# Patient Record
Sex: Female | Born: 1965
Health system: Southern US, Community
[De-identification: ages and names within clinical notes are randomized; demographics above are authoritative.]

## PROBLEM LIST (undated history)

## (undated) DIAGNOSIS — D649 Anemia, unspecified: Secondary | ICD-10-CM

## (undated) DIAGNOSIS — R112 Nausea with vomiting, unspecified: Secondary | ICD-10-CM

## (undated) DIAGNOSIS — Z8619 Personal history of other infectious and parasitic diseases: Secondary | ICD-10-CM

## (undated) DIAGNOSIS — I639 Cerebral infarction, unspecified: Secondary | ICD-10-CM

## (undated) DIAGNOSIS — Z9889 Other specified postprocedural states: Secondary | ICD-10-CM

## (undated) DIAGNOSIS — I669 Occlusion and stenosis of unspecified cerebral artery: Secondary | ICD-10-CM

## (undated) DIAGNOSIS — B977 Papillomavirus as the cause of diseases classified elsewhere: Secondary | ICD-10-CM

## (undated) DIAGNOSIS — U071 COVID-19: Secondary | ICD-10-CM

## (undated) DIAGNOSIS — U099 Post covid-19 condition, unspecified: Secondary | ICD-10-CM

## (undated) DIAGNOSIS — R519 Headache, unspecified: Secondary | ICD-10-CM

## (undated) HISTORY — DX: Personal history of other infectious and parasitic diseases: Z86.19

## (undated) HISTORY — DX: Papillomavirus as the cause of diseases classified elsewhere: B97.7

## (undated) HISTORY — DX: COVID-19: U07.1

## (undated) HISTORY — DX: Post covid-19 condition, unspecified: U09.9

## (undated) HISTORY — DX: Nausea with vomiting, unspecified: R11.2

## (undated) HISTORY — DX: Occlusion and stenosis of unspecified cerebral artery: I66.9

## (undated) HISTORY — DX: Headache, unspecified: R51.9

## (undated) HISTORY — DX: Anemia, unspecified: D64.9

## (undated) HISTORY — DX: Other specified postprocedural states: Z98.890

---

## 1999-10-12 ENCOUNTER — Other Ambulatory Visit: Admission: RE | Admit: 1999-10-12 | Discharge: 1999-10-12 | Payer: Self-pay | Admitting: Obstetrics and Gynecology

## 2000-06-14 ENCOUNTER — Emergency Department (HOSPITAL_COMMUNITY): Admission: EM | Admit: 2000-06-14 | Discharge: 2000-06-14 | Payer: Self-pay | Admitting: Emergency Medicine

## 2000-06-14 ENCOUNTER — Encounter: Payer: Self-pay | Admitting: Emergency Medicine

## 2000-07-19 ENCOUNTER — Other Ambulatory Visit: Admission: RE | Admit: 2000-07-19 | Discharge: 2000-07-19 | Payer: Self-pay | Admitting: Obstetrics and Gynecology

## 2001-02-12 ENCOUNTER — Encounter: Payer: Self-pay | Admitting: Obstetrics and Gynecology

## 2001-02-12 ENCOUNTER — Ambulatory Visit (HOSPITAL_COMMUNITY): Admission: RE | Admit: 2001-02-12 | Discharge: 2001-02-12 | Payer: Self-pay | Admitting: Obstetrics and Gynecology

## 2001-02-27 ENCOUNTER — Encounter: Payer: Self-pay | Admitting: Obstetrics and Gynecology

## 2001-02-27 ENCOUNTER — Ambulatory Visit (HOSPITAL_COMMUNITY): Admission: AD | Admit: 2001-02-27 | Discharge: 2001-02-27 | Payer: Self-pay | Admitting: Obstetrics and Gynecology

## 2001-05-31 ENCOUNTER — Inpatient Hospital Stay (HOSPITAL_COMMUNITY): Admission: AD | Admit: 2001-05-31 | Discharge: 2001-05-31 | Payer: Self-pay | Admitting: Obstetrics and Gynecology

## 2001-07-09 ENCOUNTER — Inpatient Hospital Stay (HOSPITAL_COMMUNITY): Admission: AD | Admit: 2001-07-09 | Discharge: 2001-07-09 | Payer: Self-pay | Admitting: Obstetrics and Gynecology

## 2001-08-08 ENCOUNTER — Inpatient Hospital Stay (HOSPITAL_COMMUNITY): Admission: AD | Admit: 2001-08-08 | Discharge: 2001-08-12 | Payer: Self-pay | Admitting: Obstetrics and Gynecology

## 2001-08-13 ENCOUNTER — Encounter: Admission: RE | Admit: 2001-08-13 | Discharge: 2001-09-12 | Payer: Self-pay | Admitting: Obstetrics and Gynecology

## 2001-09-11 ENCOUNTER — Other Ambulatory Visit: Admission: RE | Admit: 2001-09-11 | Discharge: 2001-09-11 | Payer: Self-pay | Admitting: Obstetrics & Gynecology

## 2001-09-25 ENCOUNTER — Encounter: Admission: RE | Admit: 2001-09-25 | Discharge: 2001-10-25 | Payer: Self-pay | Admitting: Obstetrics and Gynecology

## 2002-09-09 ENCOUNTER — Other Ambulatory Visit: Admission: RE | Admit: 2002-09-09 | Discharge: 2002-09-09 | Payer: Self-pay | Admitting: Obstetrics and Gynecology

## 2003-10-01 ENCOUNTER — Encounter: Admission: RE | Admit: 2003-10-01 | Discharge: 2003-10-01 | Payer: Self-pay | Admitting: Obstetrics and Gynecology

## 2003-10-31 HISTORY — PX: BLEPHAROPLASTY: SUR158

## 2004-02-22 ENCOUNTER — Other Ambulatory Visit: Admission: RE | Admit: 2004-02-22 | Discharge: 2004-02-22 | Payer: Self-pay | Admitting: Obstetrics and Gynecology

## 2004-09-05 ENCOUNTER — Encounter: Admission: RE | Admit: 2004-09-05 | Discharge: 2004-09-05 | Payer: Self-pay | Admitting: Obstetrics and Gynecology

## 2005-02-27 ENCOUNTER — Other Ambulatory Visit: Admission: RE | Admit: 2005-02-27 | Discharge: 2005-02-27 | Payer: Self-pay | Admitting: Obstetrics and Gynecology

## 2007-04-05 IMAGING — US MAMMO-BILAT-US
1 series · 12 of 16 positions shown · non-contrast
Comparison: NONE

CLINICAL DATA: KIYAH, RT(R)(M)   
Diagnostic  Mammogram. 

BILATERAL MAMMOGRAM ADDITIONAL VIEWS AND BILATERAL BREAST 
ULTRASOUND

[Series 1: us breast bil · 0.07mm/px · 12 of 50 slices shown]
[im 1/50]
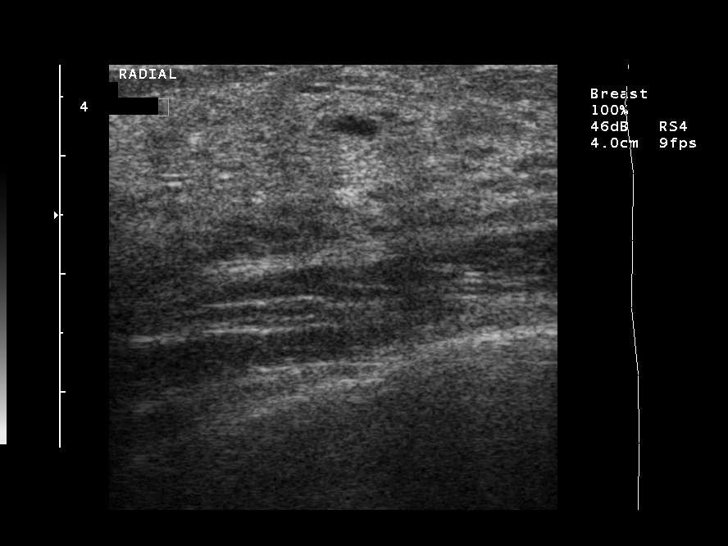
[im 7/50]
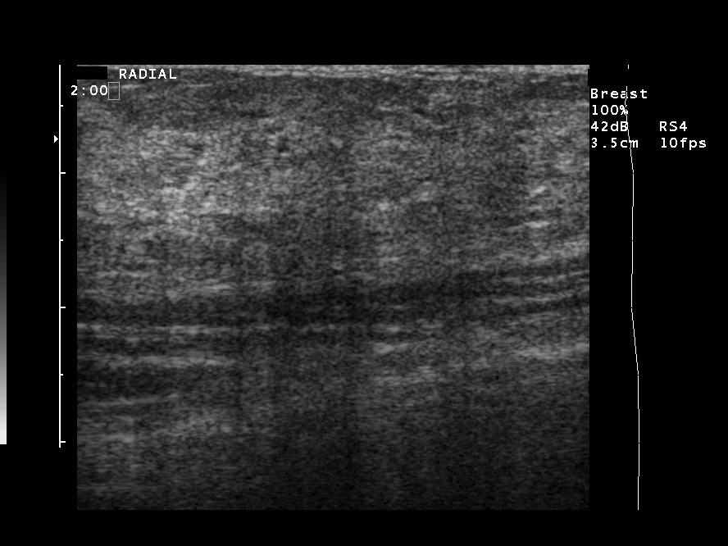
[im 10/50]
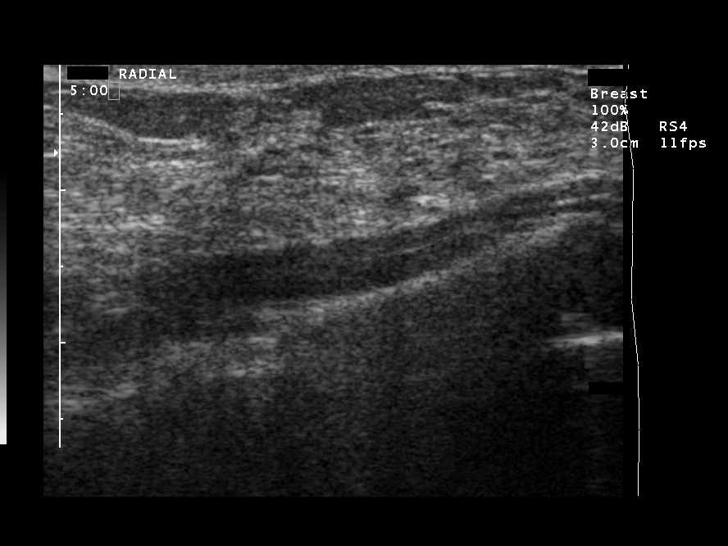
[im 14/50]
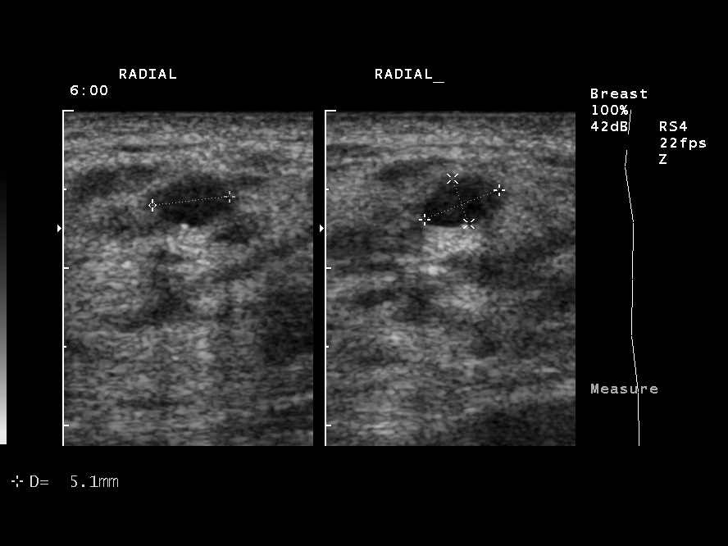
[im 20/50]
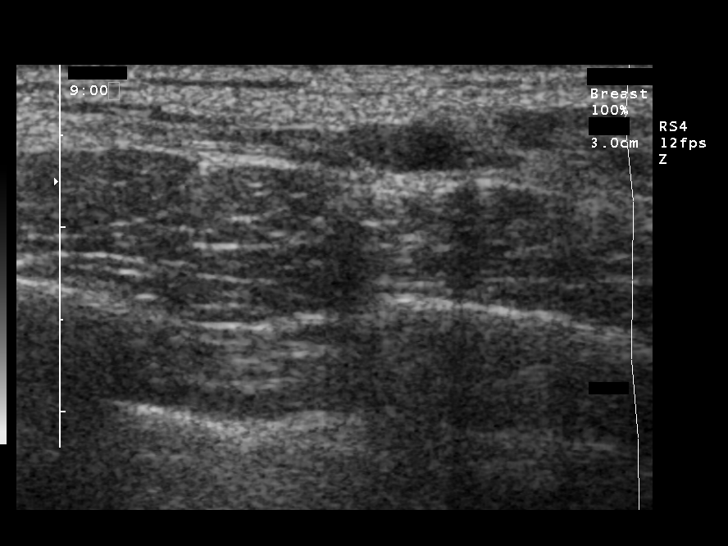
[im 23/50]
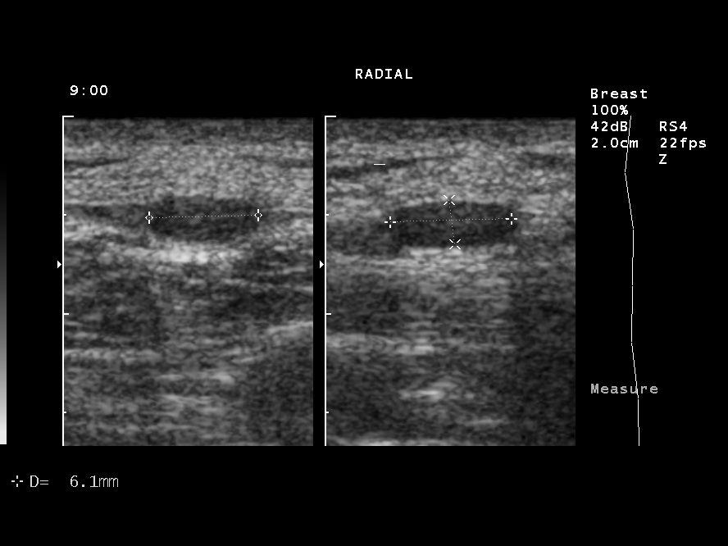
[im 27/50]
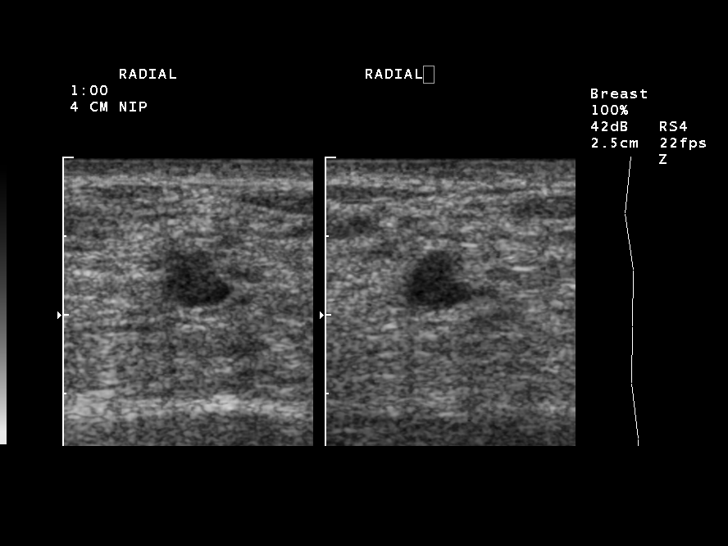
[im 33/50]
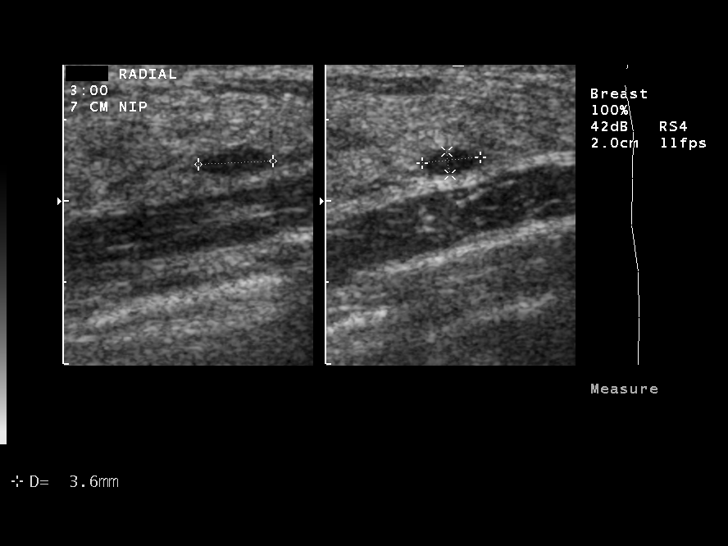
[im 36/50]
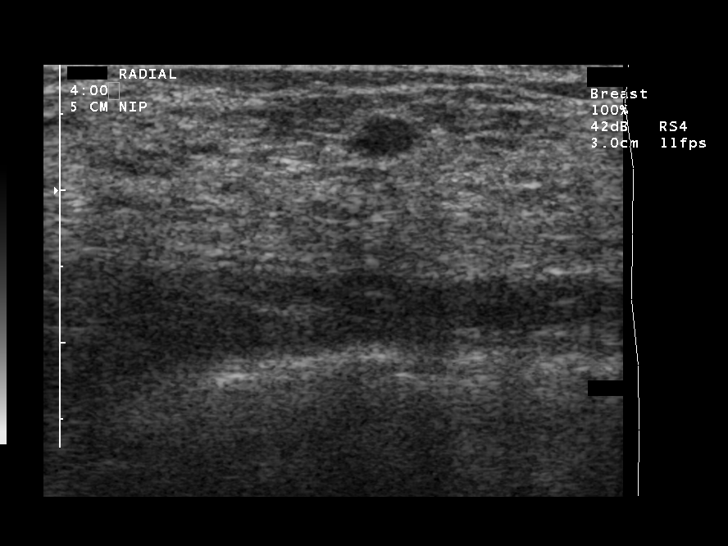
[im 40/50]
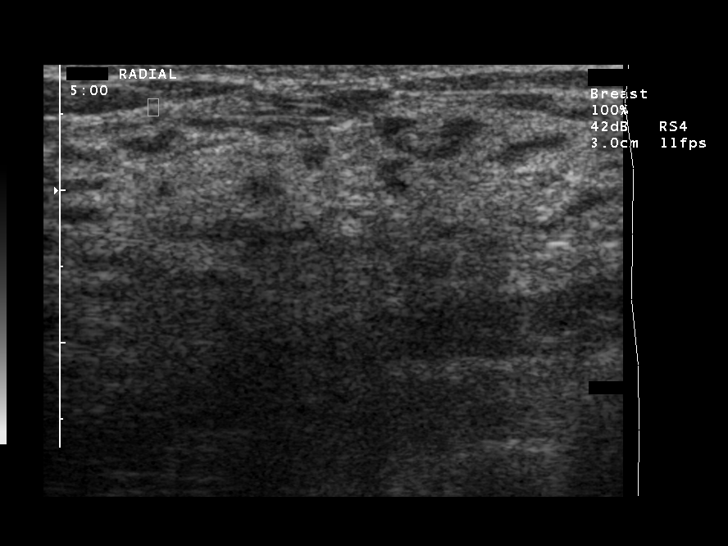
[im 46/50]
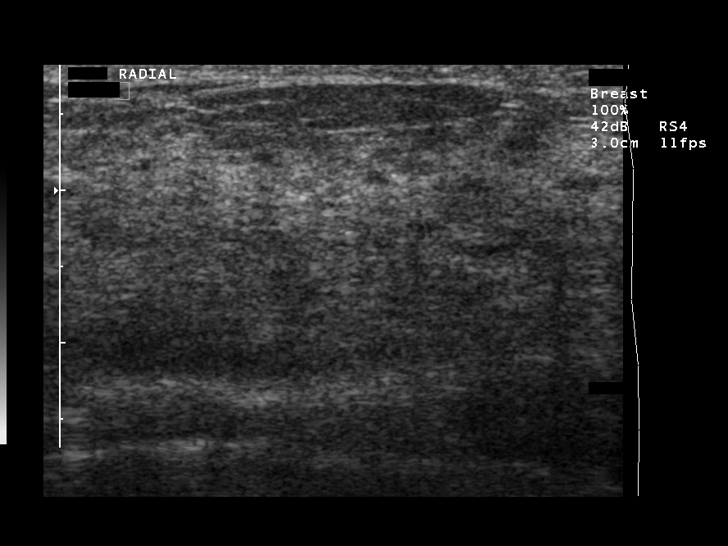
[im 50/50]
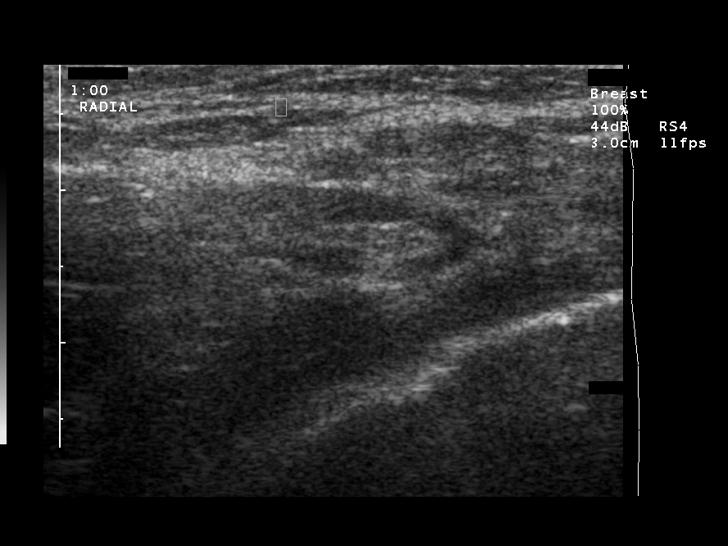

[12 of 16 positions shown; findings below may reference images not displayed]

FINDINGS: There is moderately dense nodular parenchyma 
bilaterally.  A repeat MLO view of the left breast was obtained.  
There are no dominant masses seen.  No evidence of malignant 
appearing microcalcifications.  No evidence of architectural 
distortion. The right breast demonstrates a benign calcification.  
True lateral view was obtained in the right breast.  There is a 
persistent area of increased density in the upper half of the 
breast.  This was also seen on earlier mammograms dated [DATE] 
from the [REDACTED] for comparison.  An exaggerated lateral CC 
view of the right breast was also obtained.  There again is an 
area of density in the outer aspect of the breast.  This measures 
approximately 4 cm in diameter.  This area also seems to be 
present in the earlier mammogram dated [DATE].  There are no 
malignant appearing microcalcifications seen in either breast. 
Ultrasound was obtained for both breasts.  Complex appearing cyst 
seen in the right breast at the [DATE] position, 4 cm from the 
nipple.  There also appears to be a complex cyst at the [DATE] 
position.  There is a solid oval shaped mass with a well 
circumscribed smooth margin seen in the [DATE] position of the right 
breast in the axilla region.  There is blood flow demonstrated 
within this lesion.  This measures 6 x 2 x 6 mm.  There is a 
complex appearing cyst seen in the [DATE] position of the left 
breast, 4 cm from the nipple.  There is a small oval shaped 
hypoechoic lesion with well-circumscribed margin parallel to the 
skin surface at the [DATE] position, 7 cm from the nipple.  This 
measures 4 x 1 x 5 mm.  There is a complex appearing cyst seen in 
the [DATE] position, 7 cm from the nipple.  Hypoechoic area at the 
[DATE] position, 5 cm from the nipple.  This measures 8 x 3 x 5 mm.  
No abnormal acoustic attenuation.
IMPRESSION: There are multiple hypoechoic lesions and complex 
cysts seen in both breasts.  I would recommend a six-month 
interval follow-up bilateral breast ultrasound. Area of more 
confluent rounded density seen in the outer aspect of the right 
breast.  This appears to be in the upper half of the breast on the 
MLO view.  This appears to be present on earlier mammograms as 
well.  There is no corresponding mass seen on ultrasound for this 
finding.  This is likely breast parenchyma.  I would recommend a 
six-month interval follow-up right breast mammogram. No 
mammographic evidence of malignancy is seen involving the left 
breast. I recommend routine yearly follow-up left breast screening 
mammography. The patient was informed at the time of the 
examination of the findings and recommendations by verbal and 
written lay report. Computer assisted (Second Look) technology was 
used as an aid in interpretation of this study. BI-RADS 3 - 
[DATE] Dict Date: [DATE]  Trans Date: [DATE] JH  KIYAH

## 2007-10-16 IMAGING — US MAMMO-RUNI-US
1 series · 14 of 16 positions shown · non-contrast
Comparison: NONE

CLINICAL DATA: HUMEYD Diagnostic Mammogram. 

RIGHT BREAST MAMMOGRAM AND BILATERAL BREAST ULTRASOUND

[Series 1: us b/left breast · 0.06mm/px · 14 of 46 slices shown]
[im 1/46]
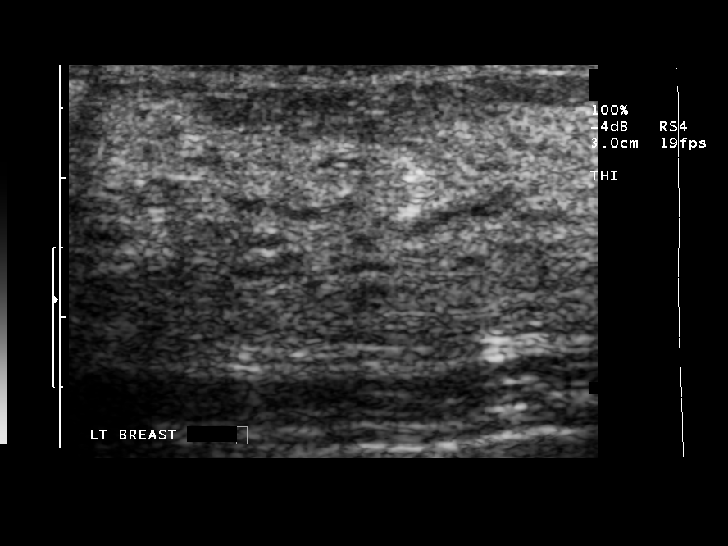
[im 4/46]
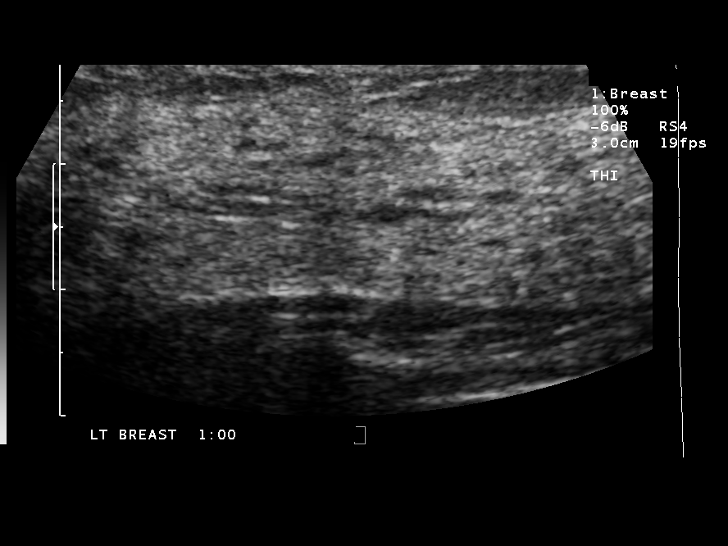
[im 7/46]
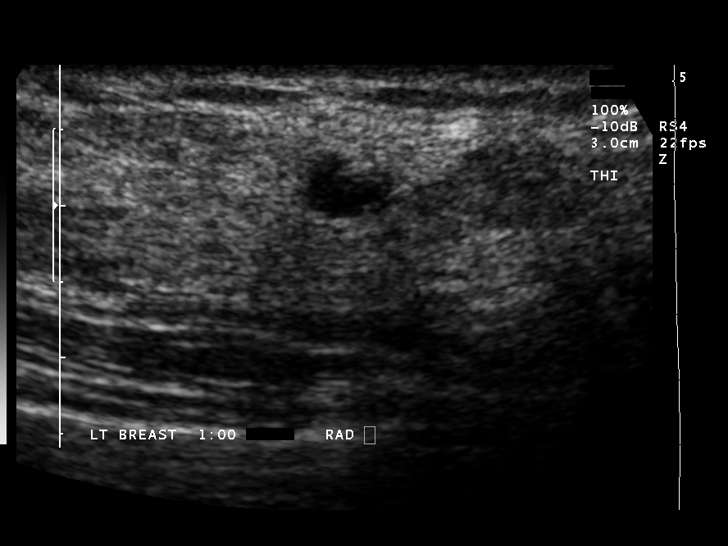
[im 13/46]
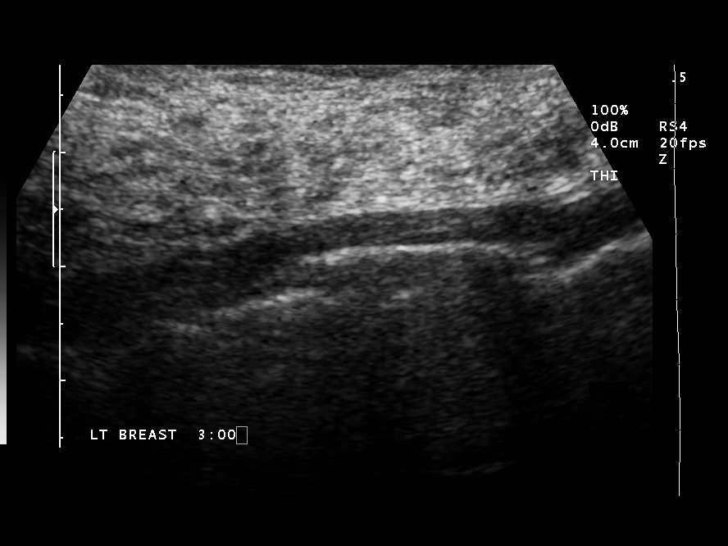
[im 16/46]
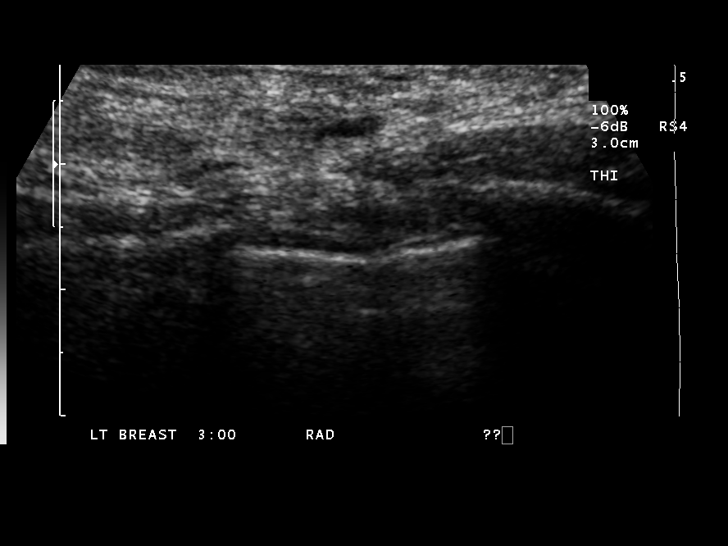
[im 19/46]
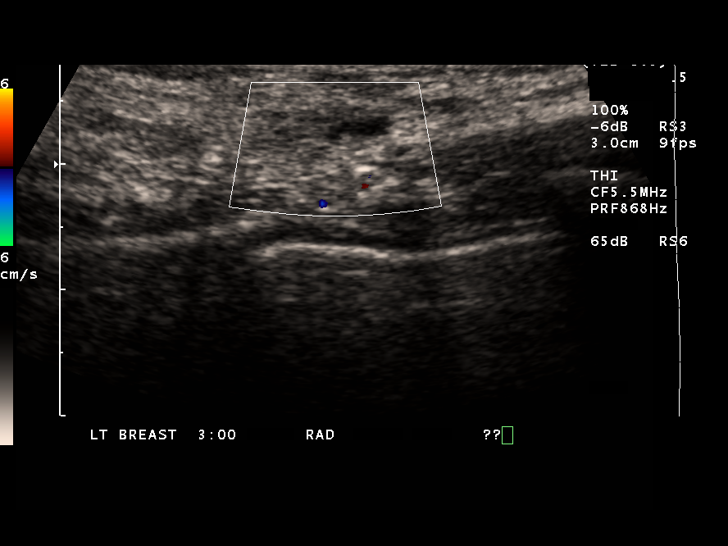
[im 22/46]
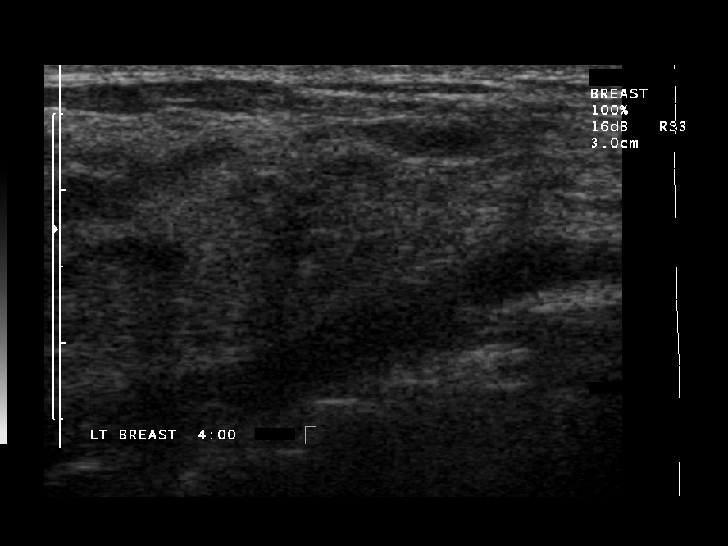
[im 25/46]
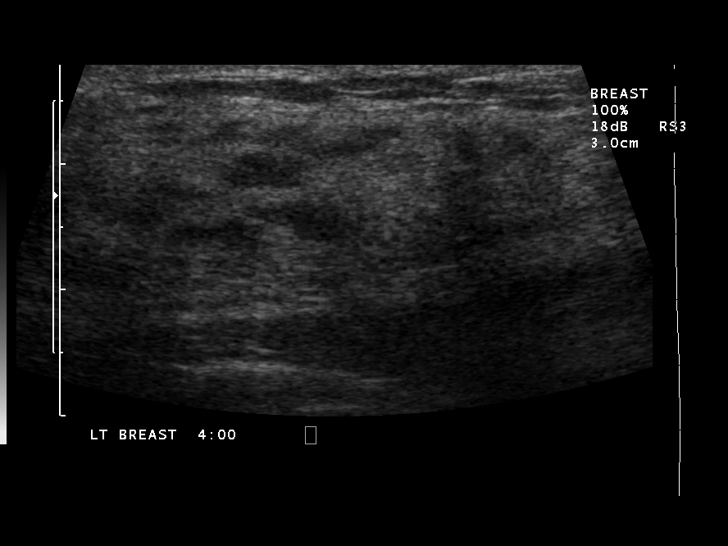
[im 28/46]
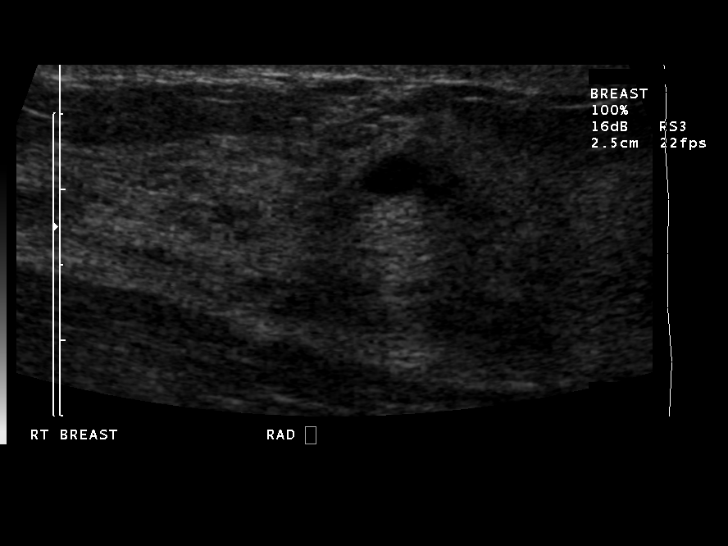
[im 31/46]
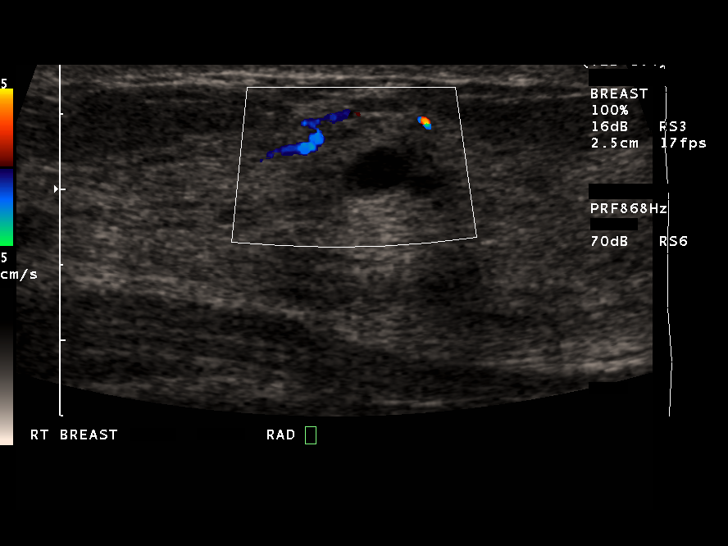
[im 37/46]
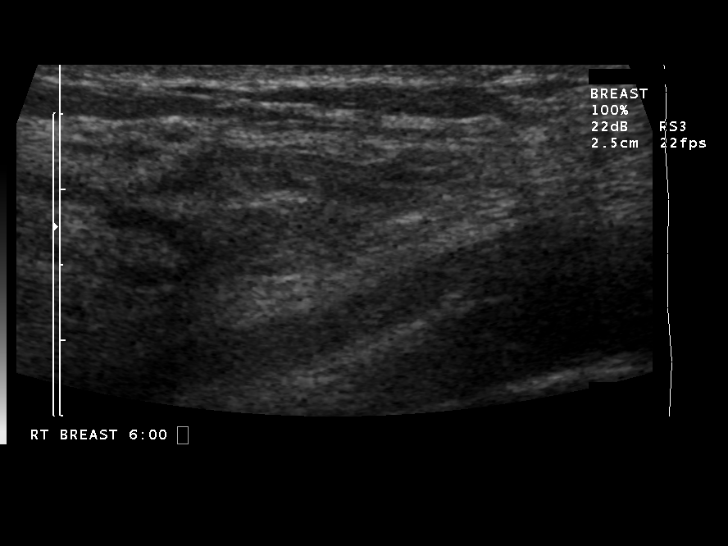
[im 40/46]
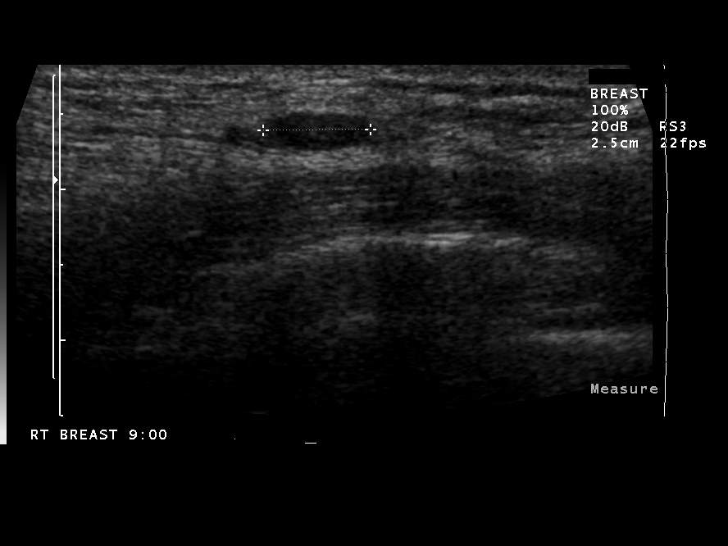
[im 43/46]
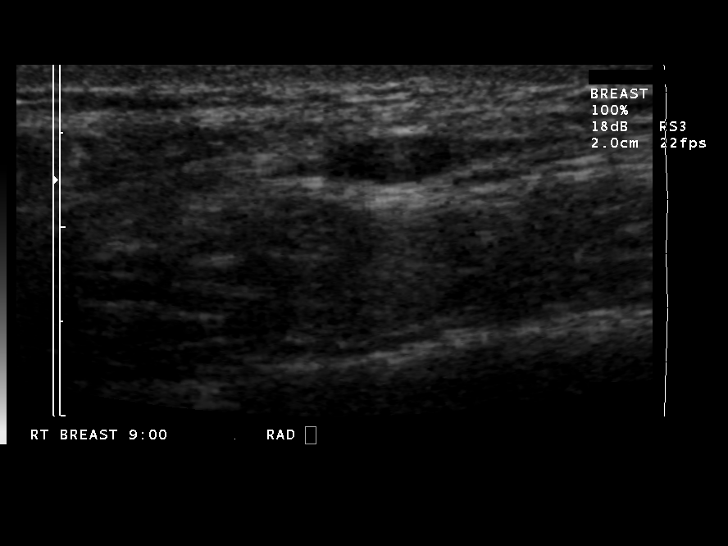
[im 46/46]
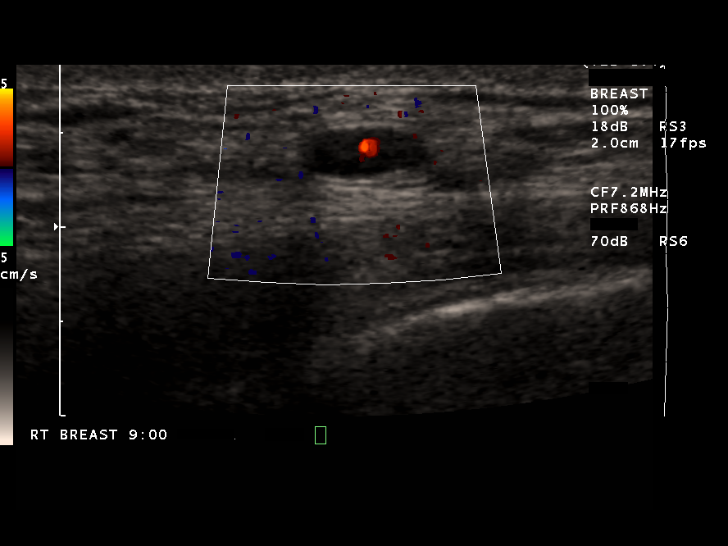

[14 of 16 positions shown; findings below may reference images not displayed]

FINDINGS: Mammogram was compared to [DATE].  Dense retained 
fibroglandular tissue.  Scattered benign appearing calcifications. 
 Multiple masses varying in size, none of which appear more 
suspicious at this time to warrant biopsy. Bilateral breast 
ultrasound demonstrates extensive cystic and fibrocystic changes 
bilaterally without evidence of suspicious cystic, solid, or 
shadowing mass.
IMPRESSION: No finding suspicious for malignancy.  Bilateral 
mammogram in six months is recommended to continue routine 
screening. Computer assisted (Second Look) technology was used as 
an aid in interpretation of this study. The patient was informed 
at the time of the examination of the findings and recommendations 
by verbal and written lay report. BI-RADS 2- Benign HUMEYD

## 2013-03-04 ENCOUNTER — Other Ambulatory Visit: Payer: Self-pay | Admitting: Obstetrics and Gynecology

## 2013-03-04 DIAGNOSIS — M79621 Pain in right upper arm: Secondary | ICD-10-CM

## 2013-03-04 DIAGNOSIS — R2231 Localized swelling, mass and lump, right upper limb: Secondary | ICD-10-CM

## 2013-03-12 ENCOUNTER — Other Ambulatory Visit: Payer: Self-pay

## 2013-03-12 ENCOUNTER — Ambulatory Visit
Admission: RE | Admit: 2013-03-12 | Discharge: 2013-03-12 | Disposition: A | Discharge status: Home / Self Care | Payer: No Typology Code available for payment source | Source: Ambulatory Visit | Attending: Obstetrics and Gynecology | Admitting: Obstetrics and Gynecology

## 2013-03-12 DIAGNOSIS — R2231 Localized swelling, mass and lump, right upper limb: Secondary | ICD-10-CM

## 2013-03-12 DIAGNOSIS — M79621 Pain in right upper arm: Secondary | ICD-10-CM

## 2013-03-12 IMAGING — MG MM DIGITAL DIAGNOSTIC UNILAT*R*
3 series · 3 of 3 positions shown · non-contrast
Comparison: Prior exams

CLINICAL DATA: Ultrasound questioned palpable finding of focal
pain right breast 10 o'clock location for 3-4 weeks.  No change
during that time.

DIGITAL DIAGNOSTIC RIGHT MAMMOGRAM WITH CAD AND RIGHT BREAST
ULTRASOUND
Mammographic images were processed with CAD.

[R CC]
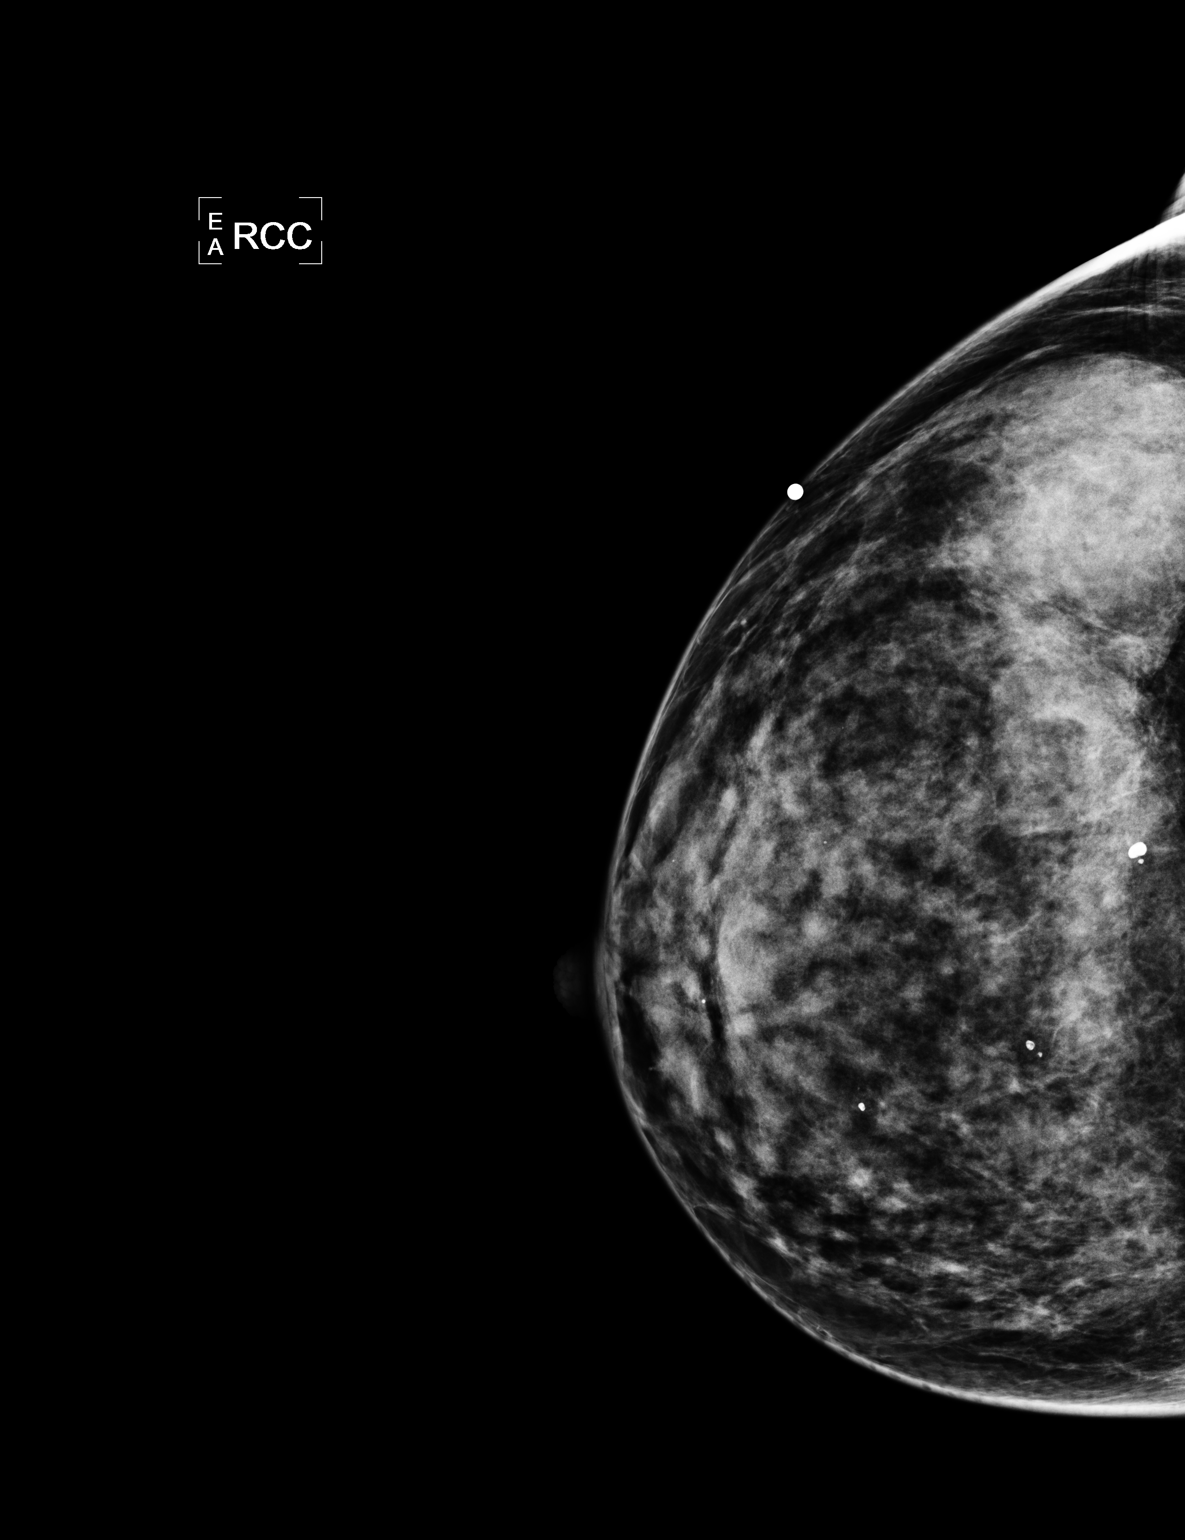

[R MLO]
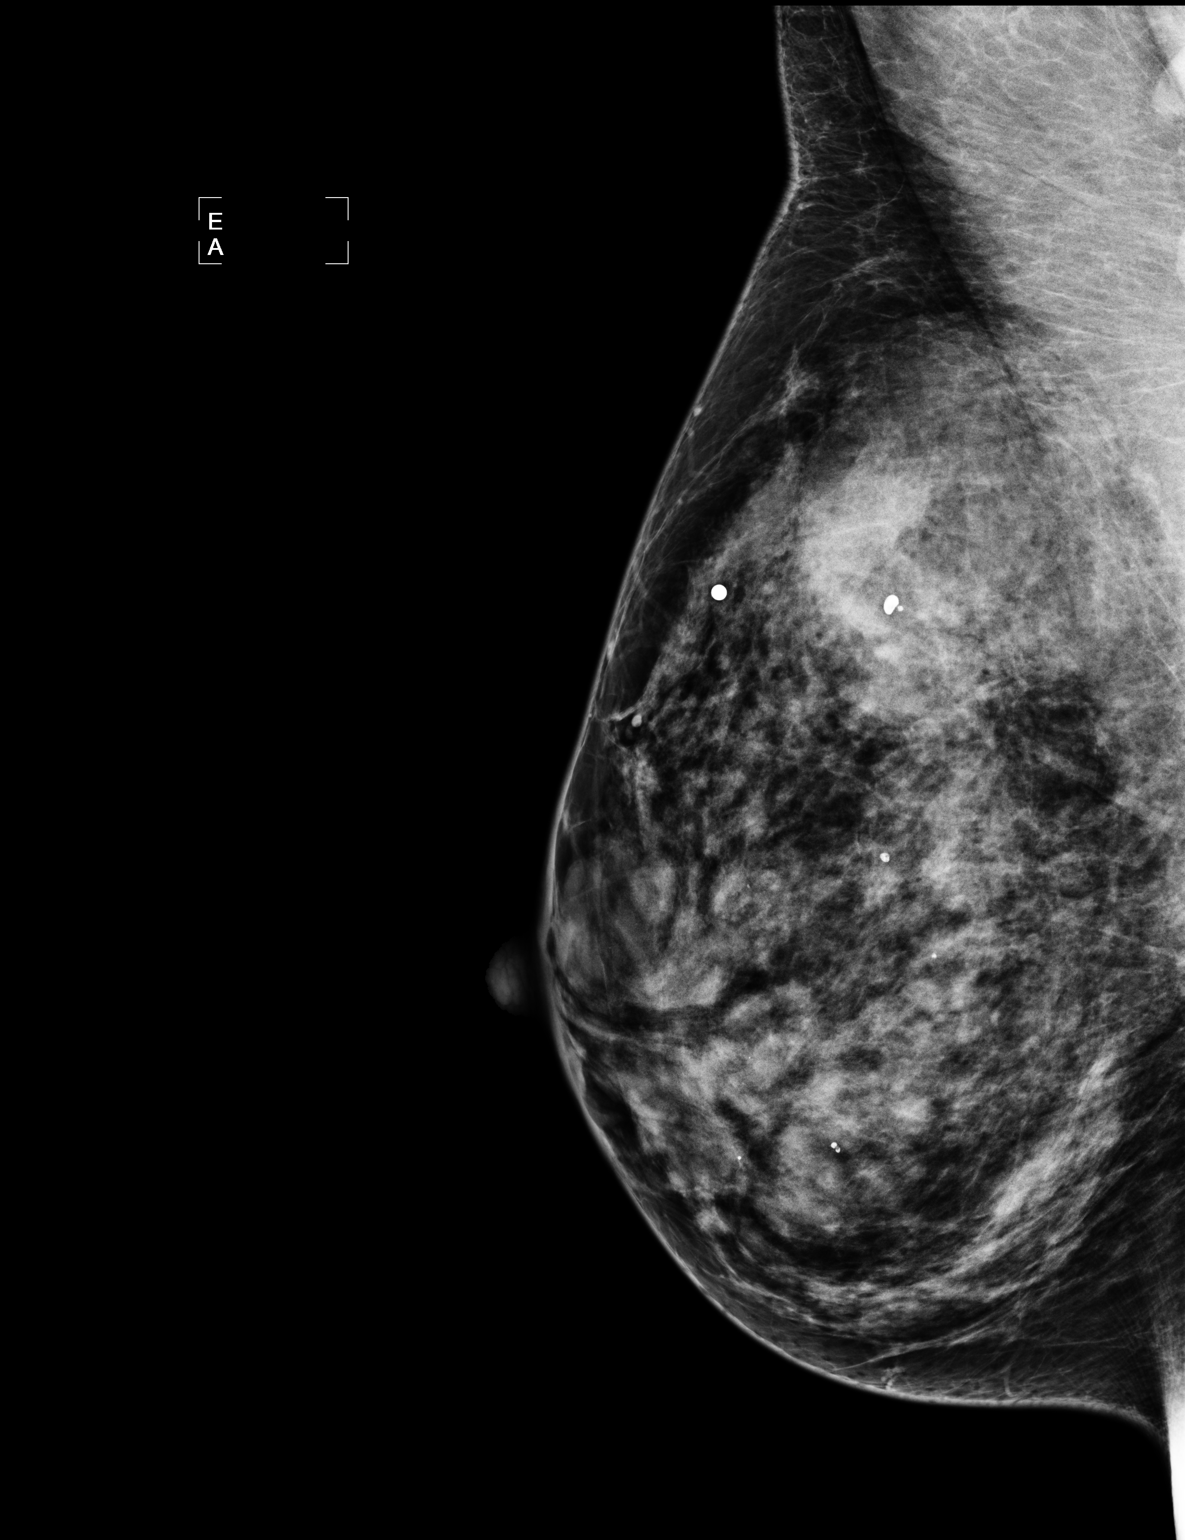

[R TAN]
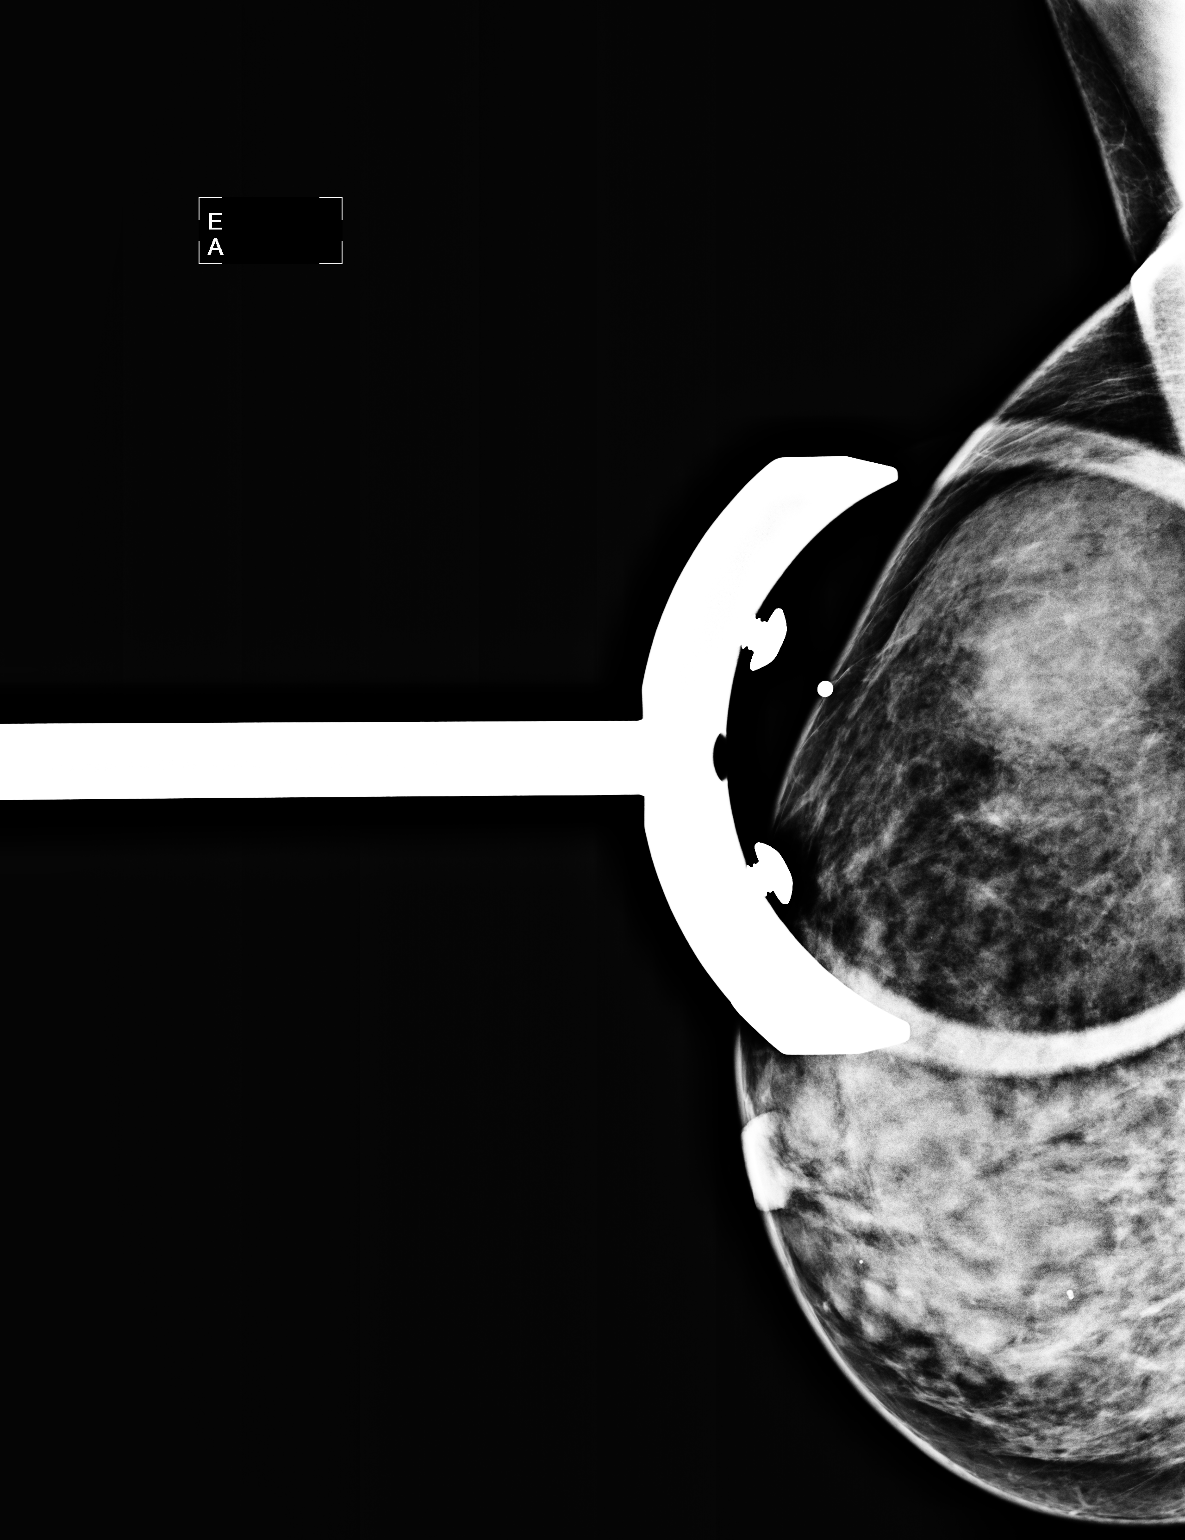

[3 of 3 positions shown; findings below may reference images not displayed]

FINDINGS: ACR Breast Density Category 4: The breast tissue is extremely
dense.

No suspicious mass, calcification, or architectural distortion is
identified in the right breast.

At physical exam, I palpate no abnormality in the right upper outer
quadrant.

Ultrasound is performed, demonstrating normal-appearing dense
fibroglandular tissue in the right upper outer quadrant but no
focal abnormality.
IMPRESSION: No evidence for malignancy in the right breast.  Any further
management of the questioned palpable finding should be dictated by
clinical assessment.  Otherwise, bilateral screening mammography is
recommended [DATE]

RECOMMENDATION:
Screening mammography [DATE]

I have discussed the findings and recommendations with the patient.
Results were also provided in writing at the conclusion of the
visit.  If applicable, a reminder letter will be sent to the
patient regarding her next appointment.

BI-RADS CATEGORY 1:  Negative.

## 2013-03-14 ENCOUNTER — Other Ambulatory Visit: Payer: Self-pay

## 2017-01-18 ENCOUNTER — Other Ambulatory Visit: Payer: Self-pay | Admitting: Obstetrics and Gynecology

## 2017-01-18 DIAGNOSIS — R928 Other abnormal and inconclusive findings on diagnostic imaging of breast: Secondary | ICD-10-CM

## 2017-01-26 ENCOUNTER — Ambulatory Visit
Admission: RE | Admit: 2017-01-26 | Discharge: 2017-01-26 | Disposition: A | Discharge status: Home / Self Care | Payer: 59 | Source: Ambulatory Visit | Attending: Obstetrics and Gynecology | Admitting: Obstetrics and Gynecology

## 2017-01-26 ENCOUNTER — Encounter: Payer: Self-pay | Admitting: Radiology

## 2017-01-26 DIAGNOSIS — R928 Other abnormal and inconclusive findings on diagnostic imaging of breast: Secondary | ICD-10-CM

## 2018-12-03 ENCOUNTER — Ambulatory Visit: Payer: 59 | Admitting: Family Medicine

## 2018-12-03 ENCOUNTER — Encounter: Payer: Self-pay | Admitting: Family Medicine

## 2018-12-03 VITALS — BP 128/81 | HR 69 | Temp 98.6°F | Resp 16 | Ht 62.5 in | Wt 132.2 lb

## 2018-12-03 DIAGNOSIS — R6889 Other general symptoms and signs: Secondary | ICD-10-CM | POA: Diagnosis not present

## 2018-12-03 DIAGNOSIS — R05 Cough: Secondary | ICD-10-CM | POA: Diagnosis not present

## 2018-12-03 DIAGNOSIS — R059 Cough, unspecified: Secondary | ICD-10-CM

## 2018-12-03 DIAGNOSIS — Z7689 Persons encountering health services in other specified circumstances: Secondary | ICD-10-CM

## 2018-12-03 DIAGNOSIS — J0100 Acute maxillary sinusitis, unspecified: Secondary | ICD-10-CM | POA: Insufficient documentation

## 2018-12-03 LAB — POC INFLUENZA A&B (BINAX/QUICKVUE)
Influenza A, POC: NEGATIVE
Influenza B, POC: NEGATIVE

## 2018-12-03 MED ORDER — DOXYCYCLINE HYCLATE 100 MG PO TABS: 100.0000 mg | ORAL_TABLET | Freq: Two times a day (BID) | ORAL | 0 refills | Status: DC

## 2018-12-03 NOTE — Progress Notes (Signed)
Patient ID: Sydney [name], female  DOB: 1966-02-01, 53 y.o.   MRN: [id number] Patient Care Team    Relationship Specialty Notifications Start End  [name], DO PCP - General Family Medicine  12/03/18   [name], MD Consulting Physician Obstetrics and Gynecology  12/03/18     Chief Complaint  Patient presents with  . Establish Care    Fasting. PCP has been GYN Dr Freda Munro. Pt works for [employer] and uses them and urgent cares for sick visits   . Cough    since 1/30   . Fever    Fever since Friday, broke yesterday     Subjective:  Sydney [name] is a 53 y.o.  female present for new patient establishment. All past medical history, surgical history, allergies, family history, immunizations, medications and social history were updated in the electronic medical record today. All recent labs, ED visits and hospitalizations within the last year were reviewed.  Flu like symptoms/cough/fever: pt reports she has had a dry cough for a few months. However 5 days ago she started to have a fever 101.5 F that worsened over the weekend with tmax 102.7 F. She took Excedrin to help with fever and aches and mucinex. She endorses nausea and vomit X1 with Excedrin use- dark "coffee ground" appearance that has resolved. No bowel changes.  She hs tolerated PO, although her appetite is decreased. She is extremely fatigued, had myalgias, tight chest feeling, cough, and severe sinus pressure.  Pt did not have her flu shot this year. One of her coworkers had the flu last week.   Depression screen [facility] 2/9 12/03/2018  Decreased Interest 0  Down, Depressed, Hopeless 0  PHQ - 2 Score 0   No flowsheet data found.      No flowsheet data found. Immunization History  Administered Date(s) Administered  . Influenza-Unspecified 08/19/2014    No exam data present  Past Medical History:  Diagnosis Date  . History of cold sores   . HPV (human papilloma virus) infection    No Known  Allergies Past Surgical History:  Procedure Laterality Date  . BLEPHAROPLASTY  2005  . CESAREAN SECTION  2002   Family History  Problem Relation Age of Onset  . Lung cancer Maternal Grandmother   . 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 Always use your most recent med list.        calcium-vitamin D 500-200 MG-UNIT tablet Commonly known as:  OSCAL WITH D Take 1 tablet by mouth.   doxycycline 100 MG tablet Commonly known as:  VIBRA-TABS Take 1 tablet (100 mg total) by mouth 2 (two) times daily.   valACYclovir 500 MG tablet Commonly known as:  VALTREX valacyclovir 500 mg tablet  TAKE 1 TABLET(S) TWICE A DAY BY ORAL ROUTE AS NEEDED.       All past medical history, surgical history,  allergies, family history, immunizations andmedications were updated in the EMR today and reviewed under the history and medication portions of their EMR.    Recent Results (from the past 2160 hour(s))  POC Influenza A&B(BINAX/QUICKVUE)     Status: Normal   Collection Time: 12/03/18 12:56 PM  Result Value Ref Range   Influenza A, POC Negative Negative   Influenza B, POC Negative Negative     Mm Diag Breast Tomo Uni Left Result Date: 01/26/2017  BI-RADS CATEGORY  1: Negative. Electronically Signed   By: Fidela Salisbury M.D.   On: 01/26/2017 16:09   ROS: 14 pt review of systems performed and negative (unless mentioned in an HPI)  Objective: BP 128/81 (BP Location: Left Arm, Patient Position: Sitting, Cuff Size: Normal)   Pulse 69   Temp 98.6 F  (37 C) (Oral)   Resp 16   Ht 5' 2.5" (1.588 m)   Wt 132 lb 4 oz (60 kg)   LMP 07/30/2018 Comment: Pt is perimenopausal   SpO2 97%   BMI 23.80 kg/m  Gen: Afebrile. No acute distress. Nontoxic in appearance, well-developed, well-nourished, pleasant caucasian female.  HENT: AT. [place]. Bilateral TM visualized and normal in appearance, normal external auditory canal. MMM, no oral lesions, adequate dentition. Bilateral nares with severe erythema, drainage and swelling. Throat without erythema, ulcerations or exudates. moderate Cough on exam and hoarseness on exam.  Eyes:Pupils Equal Round Reactive to light, Extraocular movements intact,  Conjunctiva without redness, discharge or icterus. Neck/lymp/endocrine: Supple,bilateral tender cervical anterior lymphadenopathy CV: RRR  Chest: CTAB, no wheeze, rhonchi or crackles. normal Respiratory effort. normal Air movement. Skin: no rashes, purpura or petechiae. Warm and well-perfused. Skin intact. Neuro/Msk: Normal gait. PERLA. EOMi. Alert. Oriented x3.    Assessment/plan: Sydney [name] is a 53 y.o. female present for EST- acute.  Flu-like symptoms - POC Influenza A&B(BINAX/QUICKVUE)--> negative, it has been 5 days after symptoms possibly low viral  Shedding at this point.  CoughAcute non-recurrent maxillary sinusitis Likely viral infection with 2/2 bacteria infection given worsening symptoms and high fever.   Rest, hydrate.  + flonase, mucinex DM,  nasal saline.  Doxycyline  prescribed, take until completed.  If cough present it can last up to 6-8 weeks.  Work excuse provided 2/3-5 F/U 2 weeks of not improved.   Return in about 2 weeks (around 12/17/2018), or if symptoms worsen or fail to improve.  Greater than 30 minutes spent with patient, >50% of time spent face to face counseling   Yearly CPE encouraged   Note is dictated utilizing voice recognition software. Although note has been proof read prior to signing, occasional typographical  errors still can be missed. If any questions arise, please do not hesitate to call for verification.  Electronically signed by: Howard Pouch, DO [place]

## 2018-12-03 NOTE — Patient Instructions (Addendum)
Rest, hydrate.  + flonase, mucinex DM,  nasal saline.  Doxycyline  prescribed, take until completed.  If cough present it can last up to 6-8 weeks.  F/U 2 weeks of not improved.    Sinusitis, Adult Sinusitis is soreness and swelling (inflammation) of your sinuses. Sinuses are hollow spaces in the bones around your face. They are located:  Around your eyes.  In the middle of your forehead.  Behind your nose.  In your cheekbones. Your sinuses and nasal passages are lined with a fluid called mucus. Mucus drains out of your sinuses. Swelling can trap mucus in your sinuses. This lets germs (bacteria, virus, or fungus) grow, which leads to infection. Most of the time, this condition is caused by a virus. What are the causes? This condition is caused by:  Allergies.  Asthma.  Germs.  Things that block your nose or sinuses.  Growths in the nose (nasal polyps).  Chemicals or irritants in the air.  Fungus (rare). What increases the risk? You are more likely to develop this condition if:  You have a weak body defense system (immune system).  You do a lot of swimming or diving.  You use nasal sprays too much.  You smoke. What are the signs or symptoms? The main symptoms of this condition are pain and a feeling of pressure around the sinuses. Other symptoms include:  Stuffy nose (congestion).  Runny nose (drainage).  Swelling and warmth in the sinuses.  Headache.  Toothache.  A cough that may get worse at night.  Mucus that collects in the throat or the back of the nose (postnasal drip).  Being unable to smell and taste.  Being very tired (fatigue).  A fever.  Sore throat.  Bad breath. How is this diagnosed? This condition is diagnosed based on:  Your symptoms.  Your medical history.  A physical exam.  Tests to find out if your condition is short-term (acute) or long-term (chronic). Your doctor may: ? Check your nose for growths (polyps). ? Check  your sinuses using a tool that has a light (endoscope). ? Check for allergies or germs. ? Do imaging tests, such as an MRI or CT scan. How is this treated? Treatment for this condition depends on the cause and whether it is short-term or long-term.  If caused by a virus, your symptoms should go away on their own within 10 days. You may be given medicines to relieve symptoms. They include: ? Medicines that shrink swollen tissue in the nose. ? Medicines that treat allergies (antihistamines). ? A spray that treats swelling of the nostrils. ? Rinses that help get rid of thick mucus in your nose (nasal saline washes).  If caused by bacteria, your doctor may wait to see if you will get better without treatment. You may be given antibiotic medicine if you have: ? A very bad infection. ? A weak body defense system.  If caused by growths in the nose, you may need to have surgery. Follow these instructions at home: Medicines  Take, use, or apply over-the-counter and prescription medicines only as told by your doctor. These may include nasal sprays.  If you were prescribed an antibiotic medicine, take it as told by your doctor. Do not stop taking the antibiotic even if you start to feel better. Hydrate and humidify   Drink enough water to keep your pee (urine) pale yellow.  Use a cool mist humidifier to keep the humidity level in your home above 50%.  Breathe in  steam for 10-15 minutes, 3-4 times a day, or as told by your doctor. You can do this in the bathroom while a hot shower is running.  Try not to spend time in cool or dry air. Rest  Rest as much as you can.  Sleep with your head raised (elevated).  Make sure you get enough sleep each night. General instructions   Put a warm, moist washcloth on your face 3-4 times a day, or as often as told by your doctor. This will help with discomfort.  Wash your hands often with soap and water. If there is no soap and water, use hand  sanitizer.  Do not smoke. Avoid being around people who are smoking (secondhand smoke).  Keep all follow-up visits as told by your doctor. This is important. Contact a doctor if:  You have a fever.  Your symptoms get worse.  Your symptoms do not get better within 10 days. Get help right away if:  You have a very bad headache.  You cannot stop throwing up (vomiting).  You have very bad pain or swelling around your face or eyes.  You have trouble seeing.  You feel confused.  Your neck is stiff.  You have trouble breathing. Summary  Sinusitis is swelling of your sinuses. Sinuses are hollow spaces in the bones around your face.  This condition is caused by tissues in your nose that become inflamed or swollen. This traps germs. These can lead to infection.  If you were prescribed an antibiotic medicine, take it as told by your doctor. Do not stop taking it even if you start to feel better.  Keep all follow-up visits as told by your doctor. This is important. This information is not intended to replace advice given to you by your health care provider. Make sure you discuss any questions you have with your health care provider. Document Released: 04/03/2008 Document Revised: 03/18/2018 Document Reviewed: 03/18/2018 Elsevier Interactive Patient Education  2019 [employer].    Please help [place] help you:  We are honored you have chosen [facility] for your Primary Care home. Below you will find basic instructions that you may need to access in the future. Please help [place] help you by reading the instructions, which cover many of the frequent questions we experience.   Prescription refills and request:  -In order to allow more efficient response time, please call your pharmacy for all refills. They will forward the request electronically to [place]. This allows for the quickest possible response. Request left on a nurse line can take longer to refill, since these are checked as time  allows between office patients and other phone calls.  - refill request can take up to 3-5 working days to complete.  - If request is sent electronically and request is appropiate, it is usually completed in 1-2 business days.  - all patients will need to be seen routinely for all chronic medical conditions requiring prescription medications (see follow-up below). If you are overdue for follow up on your condition, you will be asked to make an appointment and we will call in enough medication to cover you until your appointment (up to 30 days).  - all controlled substances will require a face to face visit to request/refill.  - if you desire your prescriptions to go through a new pharmacy, and have an active script at original pharmacy, you will need to call your pharmacy and have scripts transferred to new pharmacy. This is completed between the  pharmacy locations and not by your provider.    Results: If any images or labs were ordered, it can take up to 1 week to get results depending on the test ordered and the lab/facility running and resulting the test. - Normal or stable results, which do not need further discussion, may be released to your mychart immediately with attached note to you. A call may not be generated for normal results. Please make certain to sign up for mychart. If you have questions on how to activate your mychart you can call the front office.  - If your results need further discussion, our office will attempt to contact you via phone, and if unable to reach you after 2 attempts, we will release your abnormal result to your mychart with instructions.  - All results will be automatically released in mychart after 1 week.  - Your provider will provide you with explanation and instruction on all relevant material in your results. Please keep in mind, results and labs may appear confusing or abnormal to the untrained eye, but it does not mean they are actually abnormal for you  personally. If you have any questions about your results that are not covered, or you desire more detailed explanation than what was provided, you should make an appointment with your provider to do so.   Our office handles many outgoing and incoming calls daily. If we have not contacted you within 1 week about your results, please check your mychart to see if there is a message first and if not, then contact our office.  In helping with this matter, you help decrease call volume, and therefore allow [place] to be able to respond to patients needs more efficiently.   Acute office visits (sick visit):  An acute visit is intended for a new problem and are scheduled in shorter time slots to allow schedule openings for patients with new problems. This is the appropriate visit to discuss a new problem. Problems will not be addressed by phone call or Echart message. Appointment is needed if requesting treatment. In order to provide you with excellent quality medical care with proper time for you to explain your problem, have an exam and receive treatment with instructions, these appointments should be limited to one new problem per visit. If you experience a new problem, in which you desire to be addressed, please make an acute office visit, we save openings on the schedule to accommodate you. Please do not save your new problem for any other type of visit, let us take care of it properly and quickly for you.   Follow up visits:  Depending on your condition(s) your provider will need to see you routinely in order to provide you with quality care and prescribe medication(s). Most chronic conditions (Example: hypertension, Diabetes, depression/anxiety... etc), require visits a couple times a year. Your provider will instruct you on proper follow up for your personal medical conditions and history. Please make certain to make follow up appointments for your condition as instructed. Failing to do so could result in lapse  in your medication treatment/refills. If you request a refill, and are overdue to be seen on a condition, we will always provide you with a 30 day script (once) to allow you time to schedule.    Medicare wellness (well visit): - we have a wonderful Nurse Maudie Mercury), that will meet with you and provide you will yearly medicare wellness visits. These visits should occur yearly (can not be scheduled less than 1  calendar year apart) and cover preventive health, immunizations, advance directives and screenings you are entitled to yearly through your medicare benefits. Do not miss out on your entitled benefits, this is when medicare will pay for these benefits to be ordered for you.  These are strongly encouraged by your provider and is the appropriate type of visit to make certain you are up to date with all preventive health benefits. If you have not had your medicare wellness exam in the last 12 months, please make certain to schedule one by calling the office and schedule your medicare wellness with Maudie Mercury as soon as possible.   Yearly physical (well visit):  - Adults are recommended to be seen yearly for physicals. Check with your insurance and date of your last physical, most insurances require one calendar year between physicals. Physicals include all preventive health topics, screenings, medical exam and labs that are appropriate for gender/age and history. You may have fasting labs needed at this visit. This is a well visit (not a sick visit), new problems should not be covered during this visit (see acute visit).  - Pediatric patients are seen more frequently when they are younger. Your provider will advise you on well child visit timing that is appropriate for your their age. - This is not a medicare wellness visit. Medicare wellness exams do not have an exam portion to the visit. Some medicare companies allow for a physical, some do not allow a yearly physical. If your medicare allows a yearly physical you  can schedule the medicare wellness with our nurse Maudie Mercury and have your physical with your provider after, on the same day. Please check with insurance for your full benefits.   Late Policy/No Shows:  - all new patients should arrive 15-30 minutes earlier than appointment to allow [place] time  to  obtain all personal demographics,  insurance information and for you to complete office paperwork. - All established patients should arrive 10-15 minutes earlier than appointment time to update all information and be checked in .  - In our best efforts to run on time, if you are late for your appointment you will be asked to either reschedule or if able, we will work you back into the schedule. There will be a wait time to work you back in the schedule,  depending on availability.  - If you are unable to make it to your appointment as scheduled, please call 24 hours ahead of time to allow [place] to fill the time slot with someone else who needs to be seen. If you do not cancel your appointment ahead of time, you may be charged a no show fee.

## 2018-12-05 ENCOUNTER — Ambulatory Visit: Payer: Self-pay | Admitting: *Deleted

## 2018-12-05 ENCOUNTER — Telehealth: Payer: Self-pay | Admitting: Family Medicine

## 2018-12-05 MED ORDER — CEFDINIR 300 MG PO CAPS
300.0000 mg | ORAL_CAPSULE | Freq: Two times a day (BID) | ORAL | 0 refills | Status: DC
Start: 2018-12-05 — End: 2019-05-07

## 2018-12-05 NOTE — Telephone Encounter (Signed)
Summary: medication side effect   Pt states she was given doxycycline (VIBRA-TABS) 100 MG tablet on Tuesday and she has taken 5 doses total and she states she is not tolerating this well. Pt states that she is vomiting after taking this. Pt would like to speak with a nurse to know what to do.     Call to patient- patient states she has been having waves of nausea lasting 1-2 hours after taking antibiotic. Today she vomited. Patient next dose is not due until tonight. Patient wants to know if there is something else she can take. Reason for Disposition . Caller has URGENT medication question about med that PCP prescribed and triager unable to answer question    Nausea and vomiting with antibiotic.  Answer Assessment - Initial Assessment Questions 1. SYMPTOMS: "Do you have any symptoms?"     Vomiting- waves of nausea for several dose 2. SEVERITY: If symptoms are present, ask "Are they mild, moderate or severe?"     Few hours after taking antibiotic- patient is being treated for bacterial infection  Protocols used: MEDICATION QUESTION CALL-A-AH

## 2018-12-05 NOTE — Telephone Encounter (Signed)
Signed           Summary: medication side effect   Pt states she was given doxycycline (VIBRA-TABS) 100 MG tablet on Tuesday and she has taken 5 doses total and she states she is not tolerating this well. Pt states that she is vomiting after taking this. Pt would like to speak with a nurse to know what to do.     Call to patient- patient states she has been having waves of nausea lasting 1-2 hours after taking antibiotic. Today she vomited. Patient next dose is not due until tonight. Patient wants to know if there is something else she can take.       Please Advise

## 2018-12-05 NOTE — Telephone Encounter (Signed)
Copied from [place] [phone]. Topic: Quick Communication - See Telephone Encounter >> Dec 05, 2018  5:52 PM Blase Mess A wrote: CRM for notification. See Telephone encounter for: 12/05/18.  Patient is calling to see if cefdinir (OMNICEF) 300 MG capsule [[id number]] can be resent to the pharmacy.[id number] - [place], [place] (Phone) [phone] (Fax) The pharmacy does not have the script.

## 2018-12-05 NOTE — Telephone Encounter (Signed)
Spoke with pt advised to DC to doxy and start taking the omnicef tonight. Pt voiced understanding.

## 2018-12-05 NOTE — Telephone Encounter (Signed)
Can dc doxy and take omnicef next 5 days- script called in.

## 2018-12-05 NOTE — Addendum Note (Signed)
Addended by: Howard Pouch A on: 12/05/2018 01:35 PM   Modules accepted: Orders

## 2018-12-06 NOTE — Telephone Encounter (Signed)
Contacted pharmacy, they did not have rx even though it was sent [place].  I called in medication for patient. Patient notified of RX at pharmacy.

## 2019-05-07 ENCOUNTER — Encounter (HOSPITAL_COMMUNITY): Payer: Self-pay

## 2019-05-07 ENCOUNTER — Emergency Department (HOSPITAL_COMMUNITY)
Admission: EM | Admit: 2019-05-07 | Discharge: 2019-05-07 | Disposition: A | Discharge status: Home / Self Care | Payer: 59 | Attending: Emergency Medicine | Admitting: Emergency Medicine

## 2019-05-07 ENCOUNTER — Encounter: Payer: Self-pay | Admitting: Family Medicine

## 2019-05-07 ENCOUNTER — Other Ambulatory Visit: Payer: Self-pay

## 2019-05-07 ENCOUNTER — Emergency Department (HOSPITAL_COMMUNITY): Payer: 59

## 2019-05-07 ENCOUNTER — Ambulatory Visit (INDEPENDENT_AMBULATORY_CARE_PROVIDER_SITE_OTHER): Payer: 59 | Admitting: Family Medicine

## 2019-05-07 VITALS — BP 122/75 | HR 101 | Temp 99.3°F | Resp 20

## 2019-05-07 DIAGNOSIS — R0789 Other chest pain: Secondary | ICD-10-CM

## 2019-05-07 DIAGNOSIS — J90 Pleural effusion, not elsewhere classified: Secondary | ICD-10-CM | POA: Diagnosis not present

## 2019-05-07 DIAGNOSIS — R079 Chest pain, unspecified: Secondary | ICD-10-CM | POA: Diagnosis present

## 2019-05-07 DIAGNOSIS — R0781 Pleurodynia: Secondary | ICD-10-CM | POA: Diagnosis not present

## 2019-05-07 DIAGNOSIS — Z20828 Contact with and (suspected) exposure to other viral communicable diseases: Secondary | ICD-10-CM | POA: Diagnosis not present

## 2019-05-07 DIAGNOSIS — R1012 Left upper quadrant pain: Secondary | ICD-10-CM

## 2019-05-07 DIAGNOSIS — J189 Pneumonia, unspecified organism: Secondary | ICD-10-CM | POA: Insufficient documentation

## 2019-05-07 LAB — CBC WITH DIFFERENTIAL/PLATELET
Abs Immature Granulocytes: 0.07 10*3/uL (ref 0.00–0.07)
Basophils Absolute: 0 10*3/uL (ref 0.0–0.1)
Basophils Relative: 0 %
Eosinophils Absolute: 0 10*3/uL (ref 0.0–0.5)
Eosinophils Relative: 0 %
HCT: 31.5 % — ABNORMAL LOW (ref 36.0–46.0)
Hemoglobin: 10 g/dL — ABNORMAL LOW (ref 12.0–15.0)
Immature Granulocytes: 1 %
Lymphocytes Relative: 7 %
Lymphs Abs: 0.8 10*3/uL (ref 0.7–4.0)
MCH: 27.5 pg (ref 26.0–34.0)
MCHC: 31.7 g/dL (ref 30.0–36.0)
MCV: 86.5 fL (ref 80.0–100.0)
Monocytes Absolute: 0.7 10*3/uL (ref 0.1–1.0)
Monocytes Relative: 6 %
Neutro Abs: 10 10*3/uL — ABNORMAL HIGH (ref 1.7–7.7)
Neutrophils Relative %: 86 %
Platelets: 151 10*3/uL (ref 150–400)
RBC: 3.64 MIL/uL — ABNORMAL LOW (ref 3.87–5.11)
RDW: 15.2 % (ref 11.5–15.5)
WBC: 11.5 10*3/uL — ABNORMAL HIGH (ref 4.0–10.5)
nRBC: 0 % (ref 0.0–0.2)

## 2019-05-07 LAB — COMPREHENSIVE METABOLIC PANEL
ALT: 12 U/L (ref 0–44)
AST: 15 U/L (ref 15–41)
Albumin: 3.2 g/dL — ABNORMAL LOW (ref 3.5–5.0)
Alkaline Phosphatase: 55 U/L (ref 38–126)
Anion gap: 11 (ref 5–15)
BUN: 12 mg/dL (ref 6–20)
CO2: 22 mmol/L (ref 22–32)
Calcium: 9 mg/dL (ref 8.9–10.3)
Chloride: 102 mmol/L (ref 98–111)
Creatinine, Ser: 1.11 mg/dL — ABNORMAL HIGH (ref 0.44–1.00)
GFR calc Af Amer: >60 mL/min (ref >60)
GFR calc non Af Amer: 58 mL/min — ABNORMAL LOW (ref >60)
Glucose, Bld: 113 mg/dL — ABNORMAL HIGH (ref 70–99)
Potassium: 4 mmol/L (ref 3.5–5.1)
Sodium: 135 mmol/L (ref 135–145)
Total Bilirubin: 0.7 mg/dL (ref 0.3–1.2)
Total Protein: 8 g/dL (ref 6.5–8.1)

## 2019-05-07 LAB — TROPONIN I (HIGH SENSITIVITY): Troponin I (High Sensitivity): 2 ng/L (ref <18)

## 2019-05-07 IMAGING — CT CT ANGIOGRAPHY CHEST
2 of 7 series · 19 of 46 positions shown · IV contrast (APPLIED)
Comparison: None.

CLINICAL DATA: Left-sided chest pain and shortness of breath

EXAM:
CT ANGIOGRAPHY CHEST WITH CONTRAST
TECHNIQUE: Multidetector CT imaging of the chest was performed using the
standard protocol during bolus administration of intravenous
contrast. Multiplanar CT image reconstructions and MIPs were
obtained to evaluate the vascular anatomy.
CONTRAST:  100mL OMNIPAQUE IOHEXOL 350 MG/ML SOLN

[Series 7: thins · axial · 0.68mm/px · z∈[+1222,+1467]mm · 16 of 393 slices shown]
[im 22/393  lung]
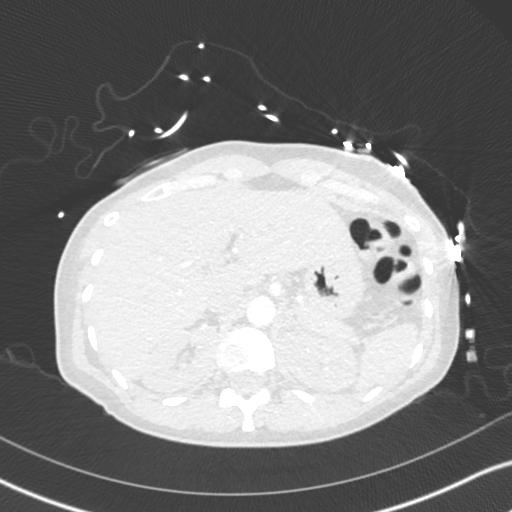
[im 44/393  soft-tissue]
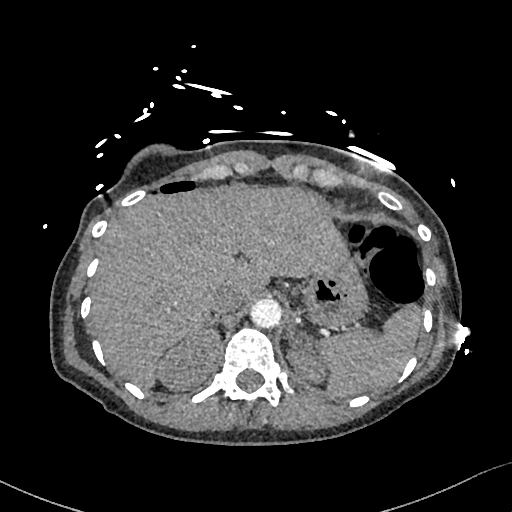
[im 66/393  lung]
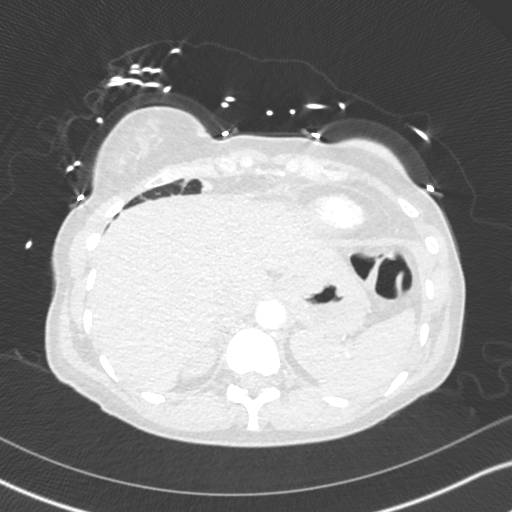
[im 88/393  soft-tissue]
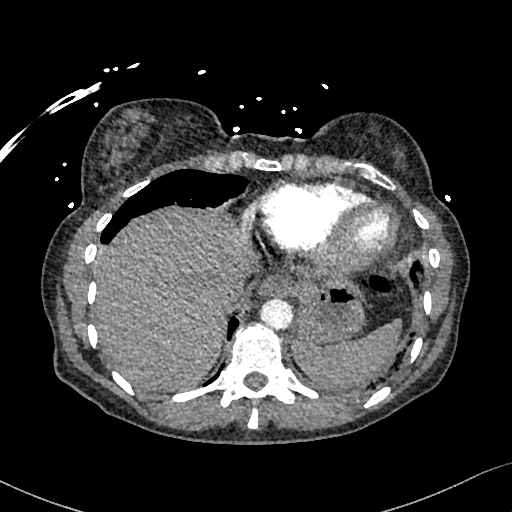
[im 109/393  lung]
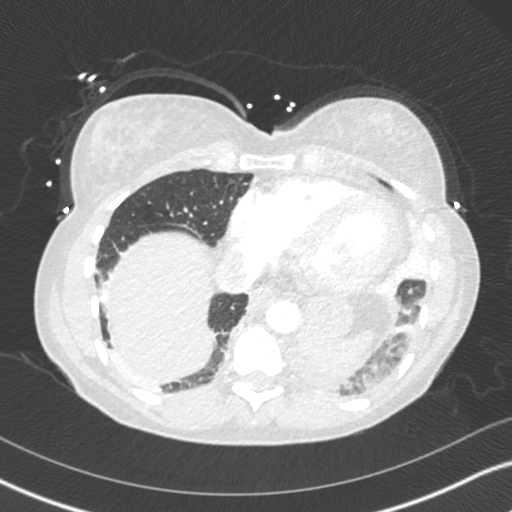
[im 131/393  soft-tissue]
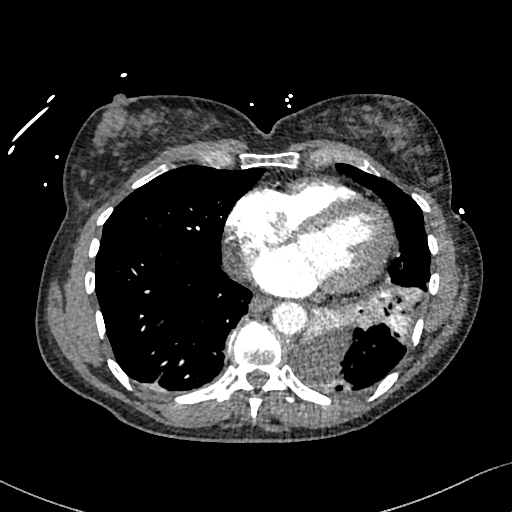
[im 153/393  lung]
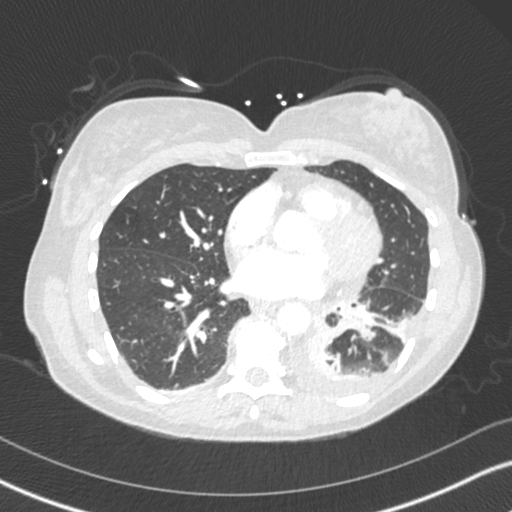
[im 175/393  soft-tissue]
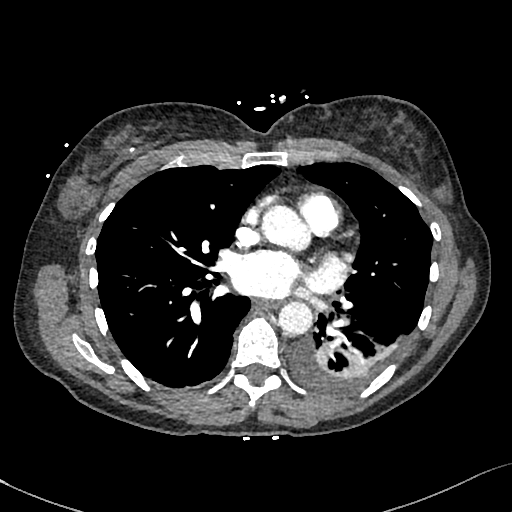
[im 218/393  lung]
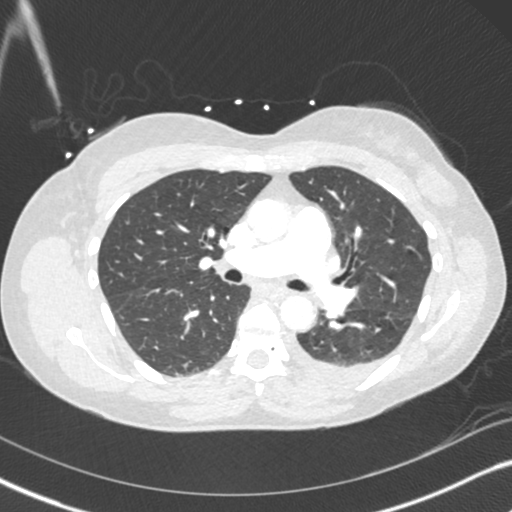
[im 240/393  soft-tissue]
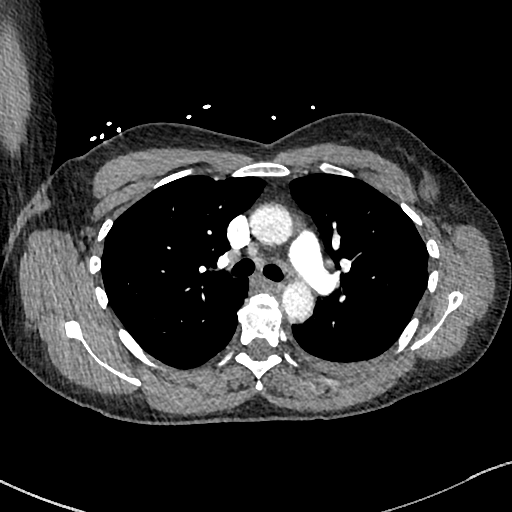
[im 262/393  lung]
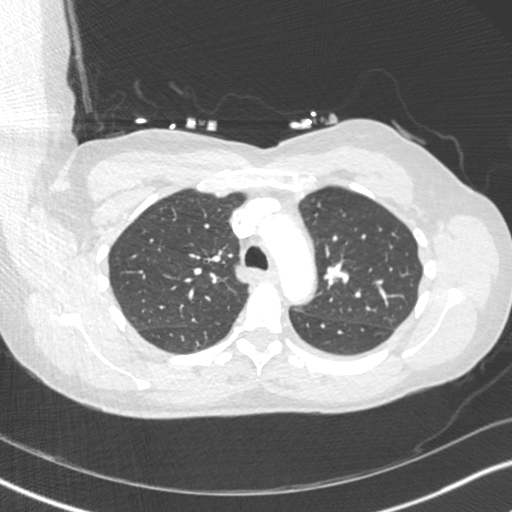
[im 284/393  soft-tissue]
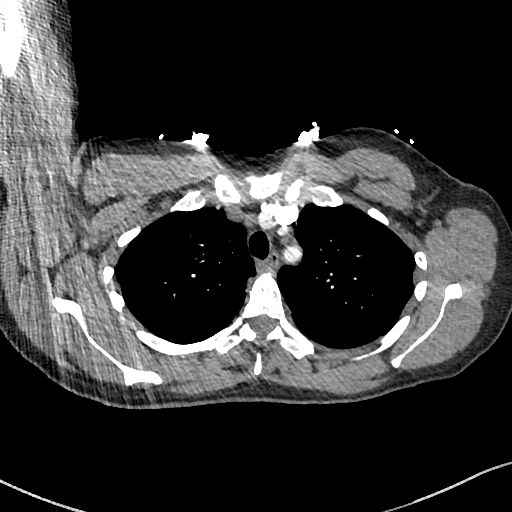
[im 305/393  lung]
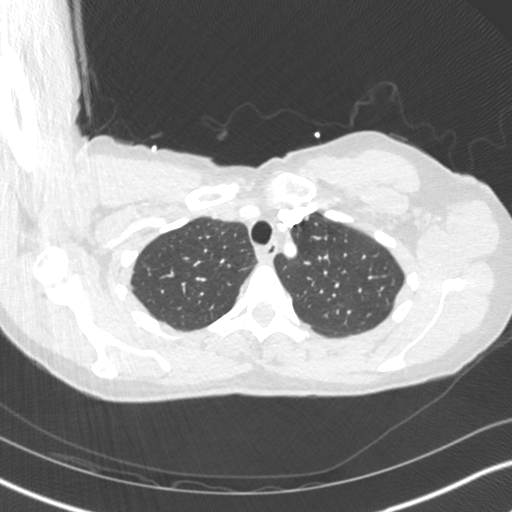
[im 327/393  soft-tissue]
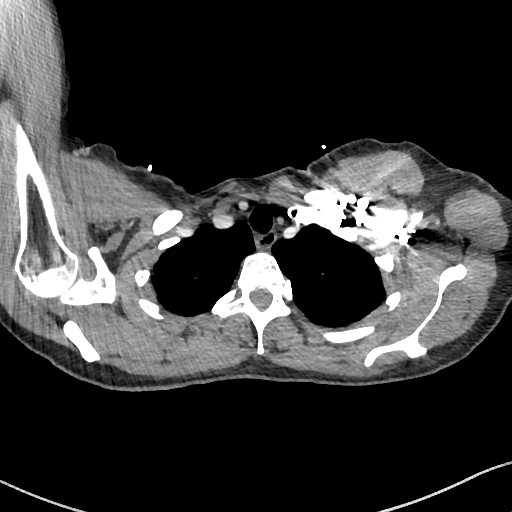
[im 349/393  lung]
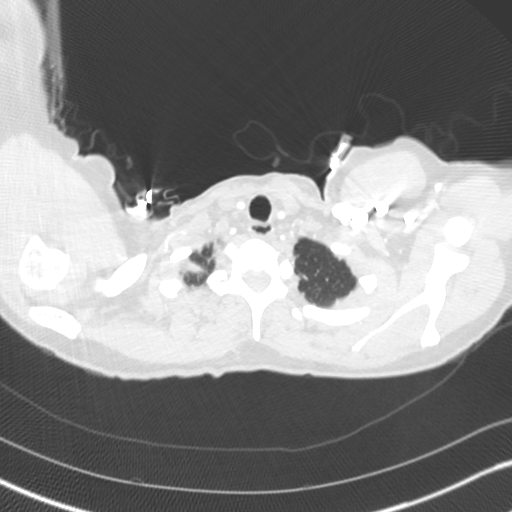
[im 371/393  soft-tissue]
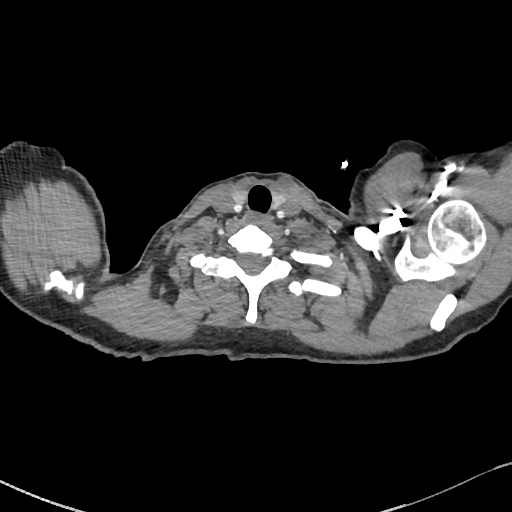

[Series 8: cor · coronal · 0.59mm/px · 3 of 124 slices shown]
[im 31/124  soft-tissue]
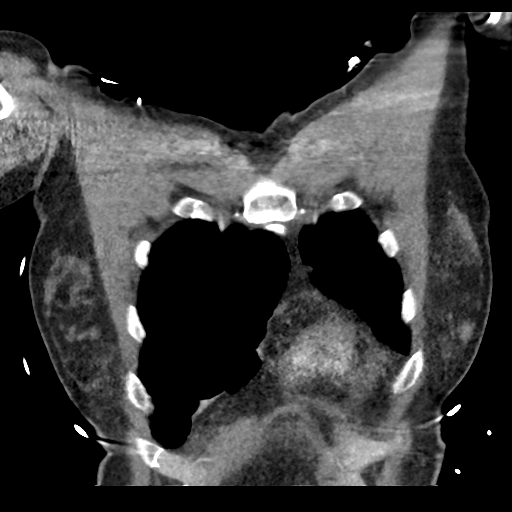
[im 62/124  soft-tissue]
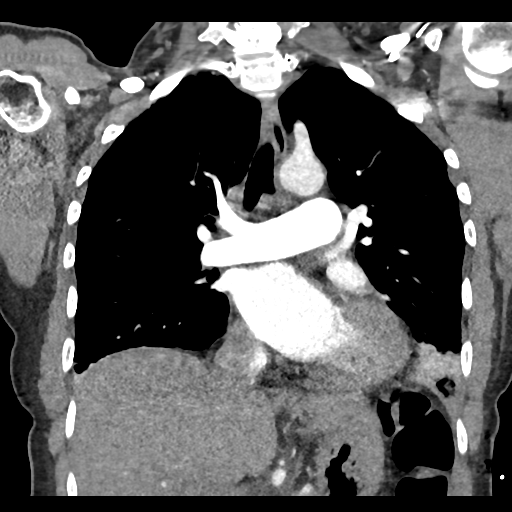
[im 93/124  soft-tissue]
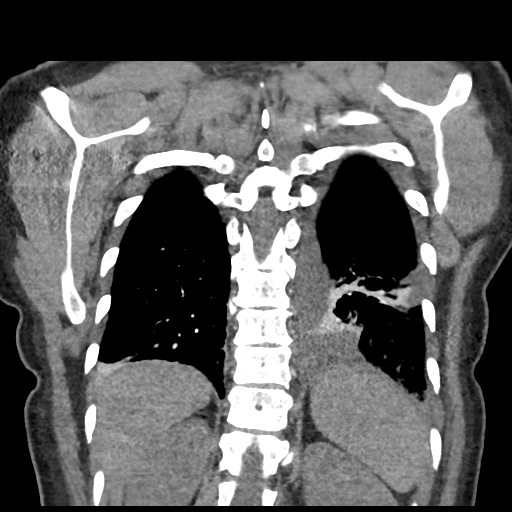

[19 of 46 positions shown; findings below may reference images not displayed]

FINDINGS: Cardiovascular: Thoracic aorta shows no evidence of aneurysmal
dilatation or dissection. No significant atherosclerotic changes are
seen. The coronary arteries demonstrate minimal atherosclerotic
change. The pulmonary artery shows a normal branching pattern. No
focal filling defect to suggest pulmonary embolism is noted.

Mediastinum/Nodes: Thoracic inlet is within normal limits. No hilar
or mediastinal adenopathy is noted. The esophagus as visualized is
within normal limits.

Lungs/Pleura: Lungs are well aerated bilaterally. The right lung
demonstrates some minimal lower lobe atelectatic changes. The left
lung shows focal infiltrate in the lower lobe without significant
consolidation. Small pleural effusion is noted. This is likely the
etiology of the patient's underlying chest pain.

Upper Abdomen: Visualized upper abdomen is within normal limits.

Musculoskeletal: Thoracic degenerative changes are seen. No rib
abnormality is seen.

Review of the MIP images confirms the above findings.
IMPRESSION: No evidence of pulmonary emboli.

Left basilar infiltrate with small effusion. No acute bony
abnormality is seen.

## 2019-05-07 MED ORDER — AZITHROMYCIN 250 MG PO TABS: 250.0000 mg | ORAL_TABLET | Freq: Every day | ORAL | 0 refills | Status: DC

## 2019-05-07 MED ORDER — KETOROLAC TROMETHAMINE 30 MG/ML IJ SOLN
30.0000 mg | Freq: Once | INTRAMUSCULAR | Status: AC
  Administered 2019-05-07: 30 mg via INTRAVENOUS
  Filled 2019-05-07: qty 1

## 2019-05-07 MED ORDER — IOHEXOL 350 MG/ML SOLN
100.0000 mL | Freq: Once | INTRAVENOUS | Status: AC | PRN
  Administered 2019-05-07: 100 mL via INTRAVENOUS

## 2019-05-07 MED ORDER — CYCLOBENZAPRINE HCL 10 MG PO TABS
10.0000 mg | ORAL_TABLET | Freq: Once | ORAL | Status: AC
  Administered 2019-05-07: 10 mg via ORAL
  Filled 2019-05-07: qty 1

## 2019-05-07 MED ORDER — FENTANYL CITRATE (PF) 100 MCG/2ML IJ SOLN
50.0000 ug | Freq: Once | INTRAMUSCULAR | Status: AC
  Administered 2019-05-07: 50 ug via INTRAVENOUS
  Filled 2019-05-07: qty 2

## 2019-05-07 MED ORDER — ONDANSETRON HCL 4 MG/2ML IJ SOLN
4.0000 mg | Freq: Once | INTRAMUSCULAR | Status: AC
  Administered 2019-05-07: 4 mg via INTRAVENOUS
  Filled 2019-05-07: qty 2

## 2019-05-07 NOTE — ED Triage Notes (Signed)
Pt went to PCP for possible sinus infection after white water rafting.  Pt c/o left rib area pain that goes to her left arm and shoulder blade.  Pt reports nausea due to pain medication given by medic

## 2019-05-07 NOTE — Discharge Instructions (Addendum)
Take Tylenol 1000 mg 4 times a day for 1 week. This is the maximum dose of Tylenol (acetaminophen) you can take from all sources. Please check other over-the-counter medications and prescriptions to ensure you are not taking other medications that contain acetaminophen.  You may also take ibuprofen 400 mg 6 times a day alternating with or at the same time as tylenol.  °

## 2019-05-07 NOTE — ED Notes (Signed)
Patient transported to CT 

## 2019-05-07 NOTE — Patient Instructions (Signed)
EMS transport °

## 2019-05-07 NOTE — ED Notes (Signed)
Spoke with EDP about patient requesting pain medications

## 2019-05-07 NOTE — ED Provider Notes (Signed)
[facility] EMERGENCY DEPARTMENT Provider Note   CSN: [id number] Arrival date & time: 05/07/19  1456    History   Chief Complaint Chief Complaint  Patient presents with   Chest Pain    HPI Sydney [name] is a 53 y.o. female.     HPI   Presents with sharp vice like pain that began this morning around 8 or 9AM, radiates around towards the back and to the arm. Worse with deep breaths.  Worse laying down. Better sitting up.  Feels like needs to lean over while walking.  Standing straight up worse.   Nausea, not sure if from pain, diaphoresis. Shortness of breath, not sure if from trying to catch breath.  Pain now 4/10, fentanyl helped, was 10/10 before  Did not have cough, was told low grade temp at [facility] physician  Was going to see them with sinus infection symptoms but these pains started today  Ear pressure and sinus pressure not letting up, this started Sunday and started getting worse, no congestion.  [place] and [place], thought it was from that then sinus symptoms started  Dont know fam hx No hx of DVT/PE, no long trips/recent surgeries No smoking, etoh, drugs Very active, works out a lot, does Physicist, medical races, has never had exertional chest pain  Past Medical History:  Diagnosis Date   History of cold sores    HPV (human papilloma virus) infection     Patient Active Problem List   Diagnosis Date Noted   Acute non-recurrent maxillary sinusitis 12/03/2018    Past Surgical History:  Procedure Laterality Date   BLEPHAROPLASTY  2005   CESAREAN SECTION  2002     OB History    Gravida  1   Para  1   Term      Preterm      AB      Living  1     SAB      TAB      Ectopic      Multiple      Live Births               Home Medications    Prior to Admission medications   Medication Sig Start Date End Date Taking? Authorizing Provider  azithromycin (ZITHROMAX) 250  MG tablet Take 1 tablet (250 mg total) by mouth daily. Take first 2 tablets together, then 1 every day until finished. 05/07/19   Gareth Morgan, MD    Family History Family History  Problem Relation Age of Onset   Lung cancer Maternal Grandmother    Lung cancer Maternal Grandfather     Social History Social History   Tobacco Use   Smoking status: Never Smoker   Smokeless tobacco: Never Used  Substance Use Topics   Alcohol use: Never    Frequency: Never   Drug use: Never     Allergies   Doxycycline   Review of Systems Review of Systems  Constitutional: Negative for fever.  HENT: Positive for congestion, ear pain (left) and sinus pressure.   Respiratory: Positive for shortness of breath ( ). Negative for cough.   Cardiovascular: Positive for chest pain.  Gastrointestinal: Negative for abdominal pain, diarrhea, nausea and vomiting.  Genitourinary: Negative for dysuria.  Musculoskeletal: Negative for back pain.  Skin: Negative for rash.  Neurological: Negative for syncope and headaches (sinus pressure).     Physical Exam Updated Vital Signs BP 125/76  Pulse 69    Temp 99.8 F (37.7 C) (Oral)    Resp 20    Ht 5\' 3"  (1.6 m)    Wt 59.9 kg    SpO2 95%    BMI 23.38 kg/m   Physical Exam Vitals signs and nursing note reviewed.  Constitutional:      General: She is not in acute distress.    Appearance: She is well-developed. She is not diaphoretic.  HENT:     Head: Normocephalic and atraumatic.  Eyes:     Conjunctiva/sclera: Conjunctivae normal.  Neck:     Musculoskeletal: Normal range of motion.  Cardiovascular:     Rate and Rhythm: Normal rate and regular rhythm.     Heart sounds: Normal heart sounds. No murmur. No friction rub. No gallop.   Pulmonary:     Effort: Pulmonary effort is normal. No respiratory distress.     Breath sounds: Normal breath sounds. No wheezing or rales.  Abdominal:     General: There is no distension.     Palpations: Abdomen  is soft.     Tenderness: There is no abdominal tenderness. There is no guarding.  Musculoskeletal:        General: No tenderness.  Skin:    General: Skin is warm and dry.     Findings: No erythema or rash.  Neurological:     Mental Status: She is alert and oriented to person, place, and time.      ED Treatments / Results  Labs (all labs ordered are listed, but only abnormal results are displayed) Labs Reviewed  CBC WITH DIFFERENTIAL/PLATELET - Abnormal; Notable for the following components:      Result Value   WBC 11.5 (*)    RBC 3.64 (*)    Hemoglobin 10.0 (*)    HCT 31.5 (*)    Neutro Abs 10.0 (*)    All other components within normal limits  COMPREHENSIVE METABOLIC PANEL - Abnormal; Notable for the following components:   Glucose, Bld 113 (*)    Creatinine, Ser 1.11 (*)    Albumin 3.2 (*)    GFR calc non Af Amer 58 (*)    All other components within normal limits  NOVEL CORONAVIRUS, NAA (HOSPITAL ORDER, SEND-OUT TO REF LAB)  TROPONIN I (HIGH SENSITIVITY)    EKG EKG Interpretation  Date/Time:  Wednesday May 07 2019 15:05:21 EDT Ventricular Rate:  71 PR Interval:    QRS Duration: 83 QT Interval:  396 QTC Calculation: 431 R Axis:   72 Text Interpretation:  Sinus rhythm Probable left atrial enlargement No previous ECGs available Confirmed by Gareth Morgan [phone]) on 05/07/2019 3:12:11 PM Also confirmed by Gareth Morgan [phone])  on 05/07/2019 6:53:25 PM   Radiology Ct Angio Chest Pe W And/or Wo Contrast  Result Date: 05/07/2019 CLINICAL DATA:  Left-sided chest pain and shortness of breath EXAM: CT ANGIOGRAPHY CHEST WITH CONTRAST TECHNIQUE: Multidetector CT imaging of the chest was performed using the standard protocol during bolus administration of intravenous contrast. Multiplanar CT image reconstructions and MIPs were obtained to evaluate the vascular anatomy. CONTRAST:  176mL OMNIPAQUE IOHEXOL 350 MG/ML SOLN COMPARISON:  None. FINDINGS: Cardiovascular: Thoracic  aorta shows no evidence of aneurysmal dilatation or dissection. No significant atherosclerotic changes are seen. The coronary arteries demonstrate minimal atherosclerotic change. The pulmonary artery shows a normal branching pattern. No focal filling defect to suggest pulmonary embolism is noted. Mediastinum/Nodes: Thoracic inlet is within normal limits. No hilar or mediastinal adenopathy is noted. The esophagus as  visualized is within normal limits. Lungs/Pleura: Lungs are well aerated bilaterally. The right lung demonstrates some minimal lower lobe atelectatic changes. The left lung shows focal infiltrate in the lower lobe without significant consolidation. Small pleural effusion is noted. This is likely the etiology of the patient's underlying chest pain. Upper Abdomen: Visualized upper abdomen is within normal limits. Musculoskeletal: Thoracic degenerative changes are seen. No rib abnormality is seen. Review of the MIP images confirms the above findings. IMPRESSION: No evidence of pulmonary emboli. Left basilar infiltrate with small effusion. No acute bony abnormality is seen. Electronically Signed   By: Inez Catalina M.D.   On: 05/07/2019 19:05    Procedures Procedures (including critical care time)  Medications Ordered in ED Medications  fentaNYL (SUBLIMAZE) injection 50 mcg (50 mcg Intravenous Given 05/07/19 1904)  cyclobenzaprine (FLEXERIL) tablet 10 mg (10 mg Oral Given 05/07/19 1904)  iohexol (OMNIPAQUE) 350 MG/ML injection 100 mL (100 mLs Intravenous Contrast Given 05/07/19 1858)  ondansetron (ZOFRAN) injection 4 mg (4 mg Intravenous Given 05/07/19 2003)  ketorolac (TORADOL) 30 MG/ML injection 30 mg (30 mg Intravenous Given 05/07/19 2004)     Initial Impression / Assessment and Plan / ED Course  I have reviewed the triage vital signs and the nursing notes.  Pertinent labs & imaging results that were available during my care of the patient were reviewed by me and considered in my medical decision  making (see chart for details).        53yo female with no significant medical history presents with concern for pleuritic left sided chest pain.  Also reports sinus pain and ear ache for 3 days--do not see evidence of otitis media and discussed that given duration of symptoms feel bacterial sinus infection unlikely. Differential diagnosis for chest pain includes pulmonary embolus, dissection, pneumothorax, pneumonia, ACS, myocarditis, pericarditis.  EKG was done and evaluate by me and showed no acute ST changes and no signs of pericarditis except for a few areas of PR depression. Chest x-ray was done and evaluated by me and radiology and showed no sign of pneumonia or pneumothorax.    Patient is low risk HEART score and had delta troponins which were both low.  CTA done given pleuritic CP, dyspnea, and shows no evidence of PE but does show left sided pneumonia with pleural effusion and suspect this is most likely the etiology of her pain.  Will treat with azithromycin for CAP and sent COVID test for further evaluation. Recommend quarantine until results return. Recommend tylenol/ibuprofen and abx. Patient discharged in stable condition with understanding of reasons to return.   Final Clinical Impressions(s) / ED Diagnoses   Final diagnoses:  Chest pain, unspecified type  Community acquired pneumonia of left lung, unspecified part of lung  Pleuritic chest pain  Pleural effusion    ED Discharge Orders         Ordered    azithromycin (ZITHROMAX) 250 MG tablet  Daily     05/07/19 1937           Gareth Morgan, MD 05/08/19 1534

## 2019-05-07 NOTE — Progress Notes (Signed)
Sydney [name] , 05-09-1966, 53 y.o., female MRN: [id number] Patient Care Team    Relationship Specialty Notifications Start End  [name], DO PCP - General Family Medicine  12/03/18   [name], MD Consulting Physician Obstetrics and Gynecology  12/03/18     Chief Complaint  Patient presents with  . Chest Pain     x4 hours. EMS called      Subjective: Pt presents for an OV for a schedule left ear pain appt with acute chest pain/RUQ pain of 4 hours duration. She reports it is worsening and clutching her chest and LUQ. Small short breaths 2/2 pain. No radiation of pain to neck/face/arm. Pain radiating to back.  She tolerated water today. Has not had an appetite.  She did go white water rafting on Saturday- NO INJURY- no pain until 4 hours ago.  Depression screen [facility] 2/9 12/03/2018  Decreased Interest 0  Down, Depressed, Hopeless 0  PHQ - 2 Score 0    Allergies  Allergen Reactions  . 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 History of cold sores   . HPV (human papilloma virus) infection    Past Surgical History:  Procedure Laterality Date  . BLEPHAROPLASTY  2005  . CESAREAN SECTION  2002   Family History  Problem Relation Age of Onset  . Lung cancer Maternal Grandmother   . Lung cancer Maternal Grandfather    Allergies as of 05/07/2019      Reactions   Doxycycline Nausea And Vomiting      Medication List       Accurate as of May 07, 2019  2:02 PM. If you have any questions, ask your nurse or doctor.        calcium-vitamin D 500-200 MG-UNIT tablet Commonly known as: OSCAL WITH D Take 1  tablet by mouth.   cefdinir 300 MG capsule Commonly known as: OMNICEF Take 1 capsule (300 mg total) by mouth 2 (two) times daily.   valACYclovir 500 MG tablet Commonly known as: VALTREX valacyclovir 500 mg tablet  TAKE 1 TABLET(S) TWICE A DAY BY ORAL ROUTE AS NEEDED.       All past medical history, surgical history, allergies, family history, immunizations andmedications were updated in the EMR today and reviewed under the history and medication portions of their EMR.     ROS: Negative, with the exception of above mentioned in HPI   Objective:  BP 122/75 (BP Location: Left Arm, Patient Position: Sitting, Cuff Size: Normal)   Pulse (!) 101   Temp 99.3 F (37.4 C) (Temporal)   Resp 20   SpO2 99%  There is no height or weight on file to calculate BMI. Gen: low grade fever, nontoxic, appears distressed clutching her RUQ and chest. Unable to lay flat. Eyes:Pupils Equal Round Reactive to light, Extraocular movements intact,  Conjunctiva without redness, discharge or icterus. CV: tachycardic,  No edema Chest: CTAB, no wheeze or crackles. Increased Resp.  Abd: Soft. Guarding and splinting LUQ.  MSK: not painful to palpitation  LUQ Neuro: unable to move without pain. Alert. Oriented x3 Psych: stressed, panicked.  No exam data present No results found. No results found for this or any previous visit (from the past 24 hour(s)).  Assessment/Plan: Sydney [name] is a 53 y.o. female present for OV for  chest pain/LUQ pain - pt unexpectedly presented for her 2pm left ear pain appt with acute worsening chest pain and LUQ pain of 4 hours duration. Patient was in severe pain, unable to lay flat, clutching her chest and LUQ. Normal O2, but gasping for air initially. She has a low grade fever and tachycardiac. Marland Kitchen She has no cardiac hx. Her fhx is unknown (no relationship with them).  - EKG 12-Lead>>> by EMT without ST elevation. Tachycardic. SR.  - ASA 81 mg x3 provided.  - pt denies  alcohol use, nausea or vomit. She tolerated water only today.  - white water rafting Saturday>>> without injury and symptoms just started 4 hours ago.  - Pt drove herself, unable to drive in current condition>>> EMS transport to [facility] for emergent labs and imaging. DDX: PE, lung collapse/injury, MI, pancreatitis.  - Pt son Sydney [name] was contacted and informed of her condition as well as transport to ED.    Reviewed expectations re: course of current medical issues.  Discussed self-management of symptoms.  Outlined signs and symptoms indicating need for more acute intervention.  Patient verbalized understanding and all questions were answered.  Patient received an After-Visit Summary.    Orders Placed This Encounter  Procedures  . EKG 12-Lead   > 25 minutes spent with patient, >50% of time spent face to face     Note is dictated utilizing voice recognition software. Although note has been proof read prior to signing, occasional typographical errors still can be missed. If any questions arise, please do not hesitate to call for verification.   electronically signed by:  Howard Pouch, DO  [place]

## 2019-05-10 LAB — NOVEL CORONAVIRUS, NAA (HOSP ORDER, SEND-OUT TO REF LAB; TAT 18-24 HRS): SARS-CoV-2, NAA: NOT DETECTED

## 2019-05-28 ENCOUNTER — Telehealth: Payer: Self-pay | Admitting: Family Medicine

## 2019-05-28 NOTE — Telephone Encounter (Signed)
Pt was called and told she did not need to get the xray before coming and that insurance may not cover a repeat xray or labs without being seen for the F/U appt first. Pt verbalized understanding

## 2019-05-28 NOTE — Telephone Encounter (Signed)
Patient has scheduled a follow up from her 05/07/19 visit. Patient is doing much better. She would like to know if Dr. Raoul Pitch would like her to go have another chest xray prior to the visit so that she can discuss the results at the appt.

## 2019-06-11 ENCOUNTER — Ambulatory Visit: Payer: 59 | Admitting: Family Medicine

## 2019-06-13 ENCOUNTER — Ambulatory Visit: Payer: 59 | Admitting: Family Medicine

## 2019-07-09 ENCOUNTER — Ambulatory Visit: Payer: 59 | Admitting: Family Medicine

## 2019-07-09 DIAGNOSIS — Z0289 Encounter for other administrative examinations: Secondary | ICD-10-CM

## 2019-08-20 ENCOUNTER — Other Ambulatory Visit: Payer: Self-pay

## 2019-08-20 ENCOUNTER — Ambulatory Visit (INDEPENDENT_AMBULATORY_CARE_PROVIDER_SITE_OTHER): Payer: 59 | Admitting: Family Medicine

## 2019-08-20 ENCOUNTER — Encounter: Payer: Self-pay | Admitting: Family Medicine

## 2019-08-20 VITALS — BP 119/77 | HR 57 | Temp 98.3°F | Resp 16 | Ht 62.0 in | Wt 136.5 lb

## 2019-08-20 DIAGNOSIS — Z131 Encounter for screening for diabetes mellitus: Secondary | ICD-10-CM

## 2019-08-20 DIAGNOSIS — Z23 Encounter for immunization: Secondary | ICD-10-CM | POA: Diagnosis not present

## 2019-08-20 DIAGNOSIS — Z13220 Encounter for screening for lipoid disorders: Secondary | ICD-10-CM | POA: Diagnosis not present

## 2019-08-20 DIAGNOSIS — Z130 Encounter for screening for diseases of the blood and blood-forming organs and certain disorders involving the immune mechanism: Secondary | ICD-10-CM | POA: Diagnosis not present

## 2019-08-20 DIAGNOSIS — Z1211 Encounter for screening for malignant neoplasm of colon: Secondary | ICD-10-CM

## 2019-08-20 DIAGNOSIS — Z0000 Encounter for general adult medical examination without abnormal findings: Secondary | ICD-10-CM

## 2019-08-20 NOTE — Progress Notes (Signed)
Patient ID: Sydney [name], female  DOB: 1966/06/15, 53 y.o.   MRN: [id number] Patient Care Team    Relationship Specialty Notifications Start End  [name], DO PCP - General Family Medicine  12/03/18   [name], MD Consulting Physician Obstetrics and Gynecology  12/03/18     Chief Complaint  Patient presents with  . Annual Exam    Fasting. Dr Ouida Sills OBGYN pap and mammogram were done in 01/28/2018, will request copies.      Subjective:  Sydney [name] is a 53 y.o.  Female  present for CPE. All past medical history, surgical history, allergies, family history, immunizations, medications and social history were updated in the electronic medical record today. All recent labs, ED visits and hospitalizations within the last year were reviewed.  Health maintenance:  Colonoscopy: low risk- pt elected to have cologuard. Ordered today. If insurance will not cover she will call back and we can place a GI referral for her for screening.  Mammogram: completed:03/2019, Dr. Ouida Sills Cervical cancer screening: last pap: 03/2019, completed by: Dr. Ouida Sills Immunizations: tdap updated today, Influenza 10/20 (encouraged yearly), Shingrix #1 today Infectious disease screening: HIV declined today- likely completed with pregnancy.  DEXA: routine screen.  Assistive device: none Oxygen [id number] Patient has a Dental home. Hospitalizations/ED visits: reviewed  Depression screen [facility] 2/9 08/20/2019 12/03/2018  Decreased Interest 0 0  Down, Depressed, Hopeless 0 0  PHQ - 2 Score 0 0   No flowsheet data found.   Immunization History  Administered Date(s) Administered  . Influenza-Unspecified 08/19/2014  . Td 08/20/2019  . Zoster Recombinat (Shingrix) 08/20/2019    Past Medical History:  Diagnosis Date  . History of cold sores   . HPV (human papilloma virus) infection    Allergies  Allergen Reactions  . Doxycycline Nausea And Vomiting   Past Surgical History:   Procedure Laterality Date  . BLEPHAROPLASTY  2005  . CESAREAN SECTION  2002   Family History  Problem Relation Age of Onset  . Lung cancer Maternal Grandmother   . 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 If you have any questions, ask your nurse or doctor.        STOP taking these medications   azithromycin 250 MG tablet Commonly known as: ZITHROMAX Stopped by: Howard Pouch, DO     TAKE these medications   valACYclovir 500 MG tablet Commonly known as: VALTREX Take 500 mg by mouth 2 (two) times daily as needed.       All past medical history, surgical history, allergies, family history, immunizations andmedications were updated in the EMR today and reviewed under the history and medication portions of their EMR.     No results found for this or any previous visit (from the past 2160 hour(s)).   ROS: 14 pt review of systems performed  and negative (unless mentioned in an HPI)  Objective: BP 119/77 (BP Location: Right Arm, Patient Position: Sitting, Cuff Size: Normal)   Pulse (!) 57   Temp 98.3 F (36.8 C) (Temporal)   Resp 16   Ht 5\' 2"  (1.575 m)   Wt 136 lb 8 oz (61.9 kg)   SpO2 99%   BMI 24.97 kg/m  Gen: Afebrile. No acute distress. Nontoxic in appearance, well-developed, well-nourished,  Pleasant caucasian female.  HENT: AT. [place]. Bilateral TM visualized and normal in appearance, normal external auditory canal. MMM, no oral lesions, adequate dentition. Bilateral nares within normal limits.  Throat without erythema, ulcerations or exudates. no Cough on exam, no hoarseness on exam. Eyes:Pupils Equal Round Reactive to light, Extraocular movements intact,  Conjunctiva without redness, discharge or icterus. Neck/lymp/endocrine: Supple,no lymphadenopathy, no thyromegaly CV: RRR no murmur, no edema, +2/4 P posterior tibialis pulses. no carotid bruits. No JVD. Chest: CTAB, no wheeze, rhonchi or crackles. normal Respiratory effort. good Air movement. Abd: Soft. flat. NTND. BS present. No Masses palpated. No hepatosplenomegaly. No rebound tenderness or guarding. Skin: no rashes, purpura or petechiae. Warm and well-perfused. Skin intact. Neuro/Msk:  Normal gait. PERLA. EOMi. Alert. Oriented x3.  Cranial nerves II through XII intact. Muscle strength 5/5 upper/lower extremity. DTRs equal bilaterally. Psych: Normal affect, dress and demeanor. Normal speech. Normal thought content and judgment.   No exam data present  Assessment/plan: BRIGITA GUTRIDGE is a 53 y.o. female present for CPE Screening for deficiency anemia - CBC Diabetes mellitus screening - Hemoglobin A1c Screening cholesterol level - Lipid panel Colon cancer screening - Cologuard Encounter for preventive health examination - Comprehensive metabolic panel Patient was encouraged to exercise greater than 150 minutes a week. Patient was encouraged to choose a diet filled with fresh fruits and vegetables, and lean meats. AVS provided to patient today for education/recommendation on gender specific health and safety maintenance. Colonoscopy: low risk- pt elected to have cologuard. Ordered today. If insurance will not cover she will call back and we can place a GI referral for her for screening.  Mammogram: completed:03/2019, Dr. Ouida Sills Cervical cancer screening: last pap: 03/2019, completed by: Dr. Ouida Sills Immunizations: tdap updated today, Influenza 10/20 (encouraged yearly), Shingrix #1 today Infectious disease screening:  HIV declined today- likely completed with pregnancy.  DEXA: routine screen.  Tetanus and shingrix #1 provided today. >> shingrix #2 by nurse visit in 3 months.   Return in about 1 year (around 08/19/2020) for CPE (30 min).  Electronically signed by: Howard Pouch, DO [place]

## 2019-08-20 NOTE — Patient Instructions (Signed)
Health Maintenance, Female Adopting a healthy lifestyle and getting preventive care are important in promoting health and wellness. Ask your health care provider about:  The right schedule for you to have regular tests and exams.  Things you can do on your own to prevent diseases and keep yourself healthy. What should I know about diet, weight, and exercise? Eat a healthy diet   Eat a diet that includes plenty of vegetables, fruits, low-fat dairy products, and lean protein.  Do not eat a lot of foods that are high in solid fats, added sugars, or sodium. Maintain a healthy weight Body mass index (BMI) is used to identify weight problems. It estimates body fat based on height and weight. Your health care provider can help determine your BMI and help you achieve or maintain a healthy weight. Get regular exercise Get regular exercise. This is one of the most important things you can do for your health. Most adults should:  Exercise for at least 150 minutes each week. The exercise should increase your heart rate and make you sweat (moderate-intensity exercise).  Do strengthening exercises at least twice a week. This is in addition to the moderate-intensity exercise.  Spend less time sitting. Even light physical activity can be beneficial. Watch cholesterol and blood lipids Have your blood tested for lipids and cholesterol at 51 years of age, then have this test every 5 years. Have your cholesterol levels checked more often if:  Your lipid or cholesterol levels are high.  You are older than 51 years of age.  You are at high risk for heart disease. What should I know about cancer screening? Depending on your health history and family history, you may need to have cancer screening at various ages. This may include screening for:  Breast cancer.  Cervical cancer.  Colorectal cancer.  Skin cancer.  Lung cancer. What should I know about heart disease, diabetes, and high blood  pressure? Blood pressure and heart disease  High blood pressure causes heart disease and increases the risk of stroke. This is more likely to develop in people who have high blood pressure readings, are of African descent, or are overweight.  Have your blood pressure checked: ? Every 3-5 years if you are 18-39 years of age. ? Every year if you are 40 years old or older. Diabetes Have regular diabetes screenings. This checks your fasting blood sugar level. Have the screening done:  Once every three years after age 40 if you are at a normal weight and have a low risk for diabetes.  More often and at a younger age if you are overweight or have a high risk for diabetes. What should I know about preventing infection? Hepatitis B If you have a higher risk for hepatitis B, you should be screened for this virus. Talk with your health care provider to find out if you are at risk for hepatitis B infection. Hepatitis C Testing is recommended for:  Everyone born from 1945 through 1965.  Anyone with known risk factors for hepatitis C. Sexually transmitted infections (STIs)  Get screened for STIs, including gonorrhea and chlamydia, if: ? You are sexually active and are younger than 51 years of age. ? You are older than 51 years of age and your health care provider tells you that you are at risk for this type of infection. ? Your sexual activity has changed since you were last screened, and you are at increased risk for chlamydia or gonorrhea. Ask your health care provider if   you are at risk.  Ask your health care provider about whether you are at high risk for HIV. Your health care provider may recommend a prescription medicine to help prevent HIV infection. If you choose to take medicine to prevent HIV, you should first get tested for HIV. You should then be tested every 3 months for as long as you are taking the medicine. Pregnancy  If you are about to stop having your period (premenopausal) and  you may become pregnant, seek counseling before you get pregnant.  Take 400 to 800 micrograms (mcg) of folic acid every day if you become pregnant.  Ask for birth control (contraception) if you want to prevent pregnancy. Osteoporosis and menopause Osteoporosis is a disease in which the bones lose minerals and strength with aging. This can result in bone fractures. If you are 65 years old or older, or if you are at risk for osteoporosis and fractures, ask your health care provider if you should:  Be screened for bone loss.  Take a calcium or vitamin D supplement to lower your risk of fractures.  Be given hormone replacement therapy (HRT) to treat symptoms of menopause. Follow these instructions at home: Lifestyle  Do not use any products that contain nicotine or tobacco, such as cigarettes, e-cigarettes, and chewing tobacco. If you need help quitting, ask your health care provider.  Do not use street drugs.  Do not share needles.  Ask your health care provider for help if you need support or information about quitting drugs. Alcohol use  Do not drink alcohol if: ? Your health care provider tells you not to drink. ? You are pregnant, may be pregnant, or are planning to become pregnant.  If you drink alcohol: ? Limit how much you use to 0-1 drink a day. ? Limit intake if you are breastfeeding.  Be aware of how much alcohol is in your drink. In the [place] one drink equals one 12 oz bottle of beer (355 mL), one 5 oz glass of wine (148 mL), or one 1 oz glass of hard liquor (44 mL). General instructions  Schedule regular health, dental, and eye exams.  Stay current with your vaccines.  Tell your health care provider if: ? You often feel depressed. ? You have ever been abused or do not feel safe at home. Summary  Adopting a healthy lifestyle and getting preventive care are important in promoting health and wellness.  Follow your health care provider's instructions about healthy  diet, exercising, and getting tested or screened for diseases.  Follow your health care provider's instructions on monitoring your cholesterol and blood pressure. This information is not intended to replace advice given to you by your health care provider. Make sure you discuss any questions you have with your health care provider. Document Released: 05/01/2011 Document Revised: 10/09/2018 Document Reviewed: 10/09/2018 Elsevier Patient Education  2020 Elsevier Inc.  

## 2019-08-21 ENCOUNTER — Telehealth: Payer: Self-pay | Admitting: Family Medicine

## 2019-08-21 DIAGNOSIS — Z1211 Encounter for screening for malignant neoplasm of colon: Secondary | ICD-10-CM

## 2019-08-21 LAB — COMPREHENSIVE METABOLIC PANEL
ALT: 9 U/L (ref 0–35)
AST: 18 U/L (ref 0–37)
Albumin: 4.3 g/dL (ref 3.5–5.2)
Alkaline Phosphatase: 55 U/L (ref 39–117)
BUN: 14 mg/dL (ref 6–23)
CO2: 27 mEq/L (ref 19–32)
Calcium: 9.4 mg/dL (ref 8.4–10.5)
Chloride: 102 mEq/L (ref 96–112)
Creatinine, Ser: 1.11 mg/dL (ref 0.40–1.20)
GFR: 51.84 mL/min — ABNORMAL LOW (ref >60.00)
Glucose, Bld: 85 mg/dL (ref 70–99)
Potassium: 3.7 mEq/L (ref 3.5–5.1)
Sodium: 135 mEq/L (ref 135–145)
Total Bilirubin: 0.6 mg/dL (ref 0.2–1.2)
Total Protein: 7.9 g/dL (ref 6.0–8.3)

## 2019-08-21 LAB — LIPID PANEL
Cholesterol: 202 mg/dL — ABNORMAL HIGH (ref 0–200)
HDL: 67.6 mg/dL (ref >39.00)
LDL Cholesterol: 124 mg/dL — ABNORMAL HIGH (ref 0–99)
NonHDL: 134.7
Total CHOL/HDL Ratio: 3
Triglycerides: 56 mg/dL (ref 0.0–149.0)
VLDL: 11.2 mg/dL (ref 0.0–40.0)

## 2019-08-21 LAB — CBC
HCT: 35.3 % — ABNORMAL LOW (ref 36.0–46.0)
Hemoglobin: 11.3 g/dL — ABNORMAL LOW (ref 12.0–15.0)
MCHC: 32 g/dL (ref 30.0–36.0)
MCV: 83.9 fl (ref 78.0–100.0)
Platelets: 169 10*3/uL (ref 150.0–400.0)
RBC: 4.21 Mil/uL (ref 3.87–5.11)
RDW: 16.9 % — ABNORMAL HIGH (ref 11.5–15.5)
WBC: 4 10*3/uL (ref 4.0–10.5)

## 2019-08-21 LAB — HEMOGLOBIN A1C: Hgb A1c MFr Bld: 5.7 % (ref 4.6–6.5)

## 2019-08-21 NOTE — Telephone Encounter (Signed)
Patient is also okay with Dr. Hilarie Fredrickson as well. Either Dr. Carlean Purl or [facility].

## 2019-08-21 NOTE — Telephone Encounter (Signed)
Is this okay to place referral for GI- CPE was 08/20/2019.  Will cancel Cologuard

## 2019-08-21 NOTE — Telephone Encounter (Signed)
Patient is concerned that her insurance is not going to cover the cologuard. Patient would like to go ahead with a screening colonoscopy with Dr. Carlean Purl at [facility]. Thank you

## 2019-08-21 NOTE — Telephone Encounter (Signed)
Placed referral for her, as documented in CPE note.

## 2019-08-21 NOTE — Addendum Note (Signed)
Addended by: Howard Pouch A on: 08/21/2019 12:02 PM   Modules accepted: Orders

## 2019-08-22 ENCOUNTER — Encounter: Payer: Self-pay | Admitting: Internal Medicine

## 2019-08-25 ENCOUNTER — Telehealth: Payer: Self-pay | Admitting: Family Medicine

## 2019-08-25 DIAGNOSIS — N179 Acute kidney failure, unspecified: Secondary | ICD-10-CM

## 2019-08-25 DIAGNOSIS — D649 Anemia, unspecified: Secondary | ICD-10-CM

## 2019-08-25 NOTE — Telephone Encounter (Signed)
Please inform patient the following information: Diabetes screen/a1c is normal at 5.7. Her cholesterol is good.    Her kidney fx is mildly decreased from prior collection.  I recommend repeat the lab next week with a Urine micro collected to ensure it returns to normal. Avoid all nsaids if she takes any routinely.   She is still mildly anemia- although improved from prior collection in July. Please schedule her for an iron panel collection (lab) and FOBT home kit to see if loss is from GI system. Please explain that collection to her.   Please schedule her a lab appt to collect the above blood and urine.  Please also schedule a follow up to discuss 2nd lab results and discuss further evaluation into causes.

## 2019-08-25 NOTE — Telephone Encounter (Signed)
Called patient and asked to call back to discuss labs and recommendations

## 2019-08-25 NOTE — Telephone Encounter (Signed)
Called patient and discussed labs and recommendations. Patient is scheduled for a lab and ov with provider.

## 2019-08-26 ENCOUNTER — Encounter: Payer: Self-pay | Admitting: Family Medicine

## 2019-09-01 ENCOUNTER — Encounter: Payer: Self-pay | Admitting: Family Medicine

## 2019-09-03 ENCOUNTER — Ambulatory Visit (INDEPENDENT_AMBULATORY_CARE_PROVIDER_SITE_OTHER): Payer: 59 | Admitting: Family Medicine

## 2019-09-03 ENCOUNTER — Other Ambulatory Visit: Payer: Self-pay

## 2019-09-03 DIAGNOSIS — N179 Acute kidney failure, unspecified: Secondary | ICD-10-CM | POA: Diagnosis not present

## 2019-09-03 DIAGNOSIS — D649 Anemia, unspecified: Secondary | ICD-10-CM

## 2019-09-04 LAB — BASIC METABOLIC PANEL
BUN: 14 mg/dL (ref 6–23)
CO2: 28 mEq/L (ref 19–32)
Calcium: 9.2 mg/dL (ref 8.4–10.5)
Chloride: 99 mEq/L (ref 96–112)
Creatinine, Ser: 1.04 mg/dL (ref 0.40–1.20)
GFR: 55.87 mL/min — ABNORMAL LOW (ref >60.00)
Glucose, Bld: 85 mg/dL (ref 70–99)
Potassium: 3.3 mEq/L — ABNORMAL LOW (ref 3.5–5.1)
Sodium: 133 mEq/L — ABNORMAL LOW (ref 135–145)

## 2019-09-04 LAB — URINALYSIS W MICROSCOPIC + REFLEX CULTURE
Bacteria, UA: NONE SEEN /HPF
Bilirubin Urine: NEGATIVE
Glucose, UA: NEGATIVE
Hgb urine dipstick: NEGATIVE
Hyaline Cast: NONE SEEN /LPF
Ketones, ur: NEGATIVE
Leukocyte Esterase: NEGATIVE
Nitrites, Initial: NEGATIVE
Protein, ur: NEGATIVE
RBC / HPF: NONE SEEN /HPF (ref 0–2)
Specific Gravity, Urine: 1.005 (ref 1.001–1.03)
Squamous Epithelial / HPF: NONE SEEN /HPF (ref <=5)
WBC, UA: NONE SEEN /HPF (ref 0–5)
pH: 6 (ref 5.0–8.0)

## 2019-09-04 LAB — IRON,TIBC AND FERRITIN PANEL
%SAT: 29 % (calc) (ref 16–45)
Ferritin: 10 ng/mL — ABNORMAL LOW (ref 16–232)
Iron: 104 ug/dL (ref 45–160)
TIBC: 362 mcg/dL (calc) (ref 250–450)

## 2019-09-04 LAB — NO CULTURE INDICATED

## 2019-09-09 ENCOUNTER — Encounter: Payer: Self-pay | Admitting: Family Medicine

## 2019-09-09 ENCOUNTER — Other Ambulatory Visit: Payer: 59

## 2019-09-09 ENCOUNTER — Other Ambulatory Visit: Payer: Self-pay

## 2019-09-09 DIAGNOSIS — D649 Anemia, unspecified: Secondary | ICD-10-CM

## 2019-09-10 ENCOUNTER — Ambulatory Visit (INDEPENDENT_AMBULATORY_CARE_PROVIDER_SITE_OTHER): Payer: 59 | Admitting: Family Medicine

## 2019-09-10 ENCOUNTER — Telehealth: Payer: Self-pay | Admitting: Family Medicine

## 2019-09-10 ENCOUNTER — Encounter: Payer: Self-pay | Admitting: Family Medicine

## 2019-09-10 ENCOUNTER — Other Ambulatory Visit: Payer: Self-pay

## 2019-09-10 VITALS — Temp 97.7°F

## 2019-09-10 DIAGNOSIS — J90 Pleural effusion, not elsewhere classified: Secondary | ICD-10-CM | POA: Diagnosis not present

## 2019-09-10 DIAGNOSIS — N1831 Chronic kidney disease, stage 3a: Secondary | ICD-10-CM

## 2019-09-10 DIAGNOSIS — D509 Iron deficiency anemia, unspecified: Secondary | ICD-10-CM | POA: Insufficient documentation

## 2019-09-10 DIAGNOSIS — E876 Hypokalemia: Secondary | ICD-10-CM | POA: Diagnosis not present

## 2019-09-10 DIAGNOSIS — N183 Chronic kidney disease, stage 3 unspecified: Secondary | ICD-10-CM | POA: Insufficient documentation

## 2019-09-10 DIAGNOSIS — E871 Hypo-osmolality and hyponatremia: Secondary | ICD-10-CM

## 2019-09-10 DIAGNOSIS — J189 Pneumonia, unspecified organism: Secondary | ICD-10-CM

## 2019-09-10 LAB — HEMOCCULT SLIDES (X 3 CARDS)
Fecal Occult Blood: NEGATIVE
OCCULT 1: NEGATIVE
OCCULT 2: NEGATIVE
OCCULT 3: NEGATIVE
OCCULT 4: NEGATIVE
OCCULT 5: NEGATIVE

## 2019-09-10 MED ORDER — POLYSACCHARIDE IRON COMPLEX 150 MG PO CAPS: ORAL_CAPSULE | ORAL | 3 refills | Status: DC

## 2019-09-10 NOTE — Progress Notes (Signed)
VIRTUAL VISIT VIA VIDEO  I connected with [name] on 09/10/19 at 10:30 AM EST by a video enabled telemedicine application and verified that I am speaking with the correct person using two identifiers. Location patient: Home Location provider: [facility], Office Persons participating in the virtual visit: Patient, Dr. Raoul Pitch and R.Baker, LPN  I discussed the limitations of evaluation and management by telemedicine and the availability of in person appointments. The patient expressed understanding and agreed to proceed.     Sydney [name] , 1966-02-27, 53 y.o., female MRN: [id number] Patient Care Team    Relationship Specialty Notifications Start End  [name], DO PCP - General Family Medicine  12/03/18   [name], MD Consulting Physician Obstetrics and Gynecology  12/03/18     Chief Complaint  Patient presents with  . Follow-up    discuss lab results     Subjective: Pt presents for an OV for follow-up on decreased kidney function found at her preventative labs.Her creatinine had been 1.11 with a GFR of 51.8.  CBC with hemoglobin 11.3 and hematocrit of 35.3.  Platelets normal.  She reports she feels well, except for when she runs she does notice she gets more fatigued.  Follow-up labs were completed preappointment with normal urinalysis, improved creatinine into normal range at 1.04 and GFR improved to 55.9.  Iron panel resulted with iron 104, TIBC 362, percent saturation 29 and a low ferritin at 10.  Hemoccult cards were completed and negative.  She is scheduled with her gastroenterologist this coming month. Patient reports she had been on iron in the past, however she stopped using it because it upsets her stomach.  She does not recall which type of iron she was on in the past and believes it was an over-the-counter.  Depression screen [facility] 2/9 08/20/2019 12/03/2018  Decreased Interest 0 0  Down, Depressed, Hopeless 0 0  PHQ - 2 Score 0 0     Allergies  Allergen Reactions  . 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 History of cold sores   . HPV (human papilloma virus) infection    Past Surgical History:  Procedure Laterality Date  . BLEPHAROPLASTY  2005  . CESAREAN SECTION  2002   Family History  Problem Relation Age of Onset  . Lung cancer Maternal Grandmother   . Lung cancer Maternal Grandfather    Allergies as of 09/10/2019      Reactions   Doxycycline Nausea And Vomiting      Medication List       Accurate as of September 10, 2019 10:31 AM. If you have any questions, ask your nurse or doctor.        valACYclovir 500 MG tablet Commonly known as: VALTREX Take 500 mg by mouth 2 (two) times daily as needed.       All past medical history, surgical history, allergies, family history, immunizations andmedications were updated in the EMR today and reviewed under the history and medication portions of their EMR.  ROS: Negative, with the exception of above mentioned in HPI Objective:  Temp 97.7 F (36.5 C) (Temporal)  There is no height or weight on file to calculate BMI. Gen: Afebrile. No acute distress. Nontoxic in appearance, well developed, well nourished.  Very pleasant Caucasian female. HENT: AT. [place].  No cough or hoarseness present.   Eyes:Pupils Equal Round Reactive to light, Extraocular movements intact,  Conjunctiva without redness, discharge or icterus. CV:  No edema Chest: No cough or shortness of breath present.   Neuro: PERLA. EOMi. Alert. Oriented x3   No exam data present No results found. Results for orders placed or  performed in visit on 09/09/19 (from the past 24 hour(s))  Hemoccult Cards (X3 cards)     Status: None   Collection Time: 09/10/19  9:52 AM  Result Value Ref Range   OCCULT 1 Negative Negative   OCCULT 2 Negative Negative   OCCULT 3 Negative Negative   OCCULT 4 Negative Negative   OCCULT 5 Negative Negative   Fecal Occult Blood Negative Negative    Assessment/Plan: Sydney [name] is a 53 y.o. female present for OV for  Iron deficiency anemia, unspecified iron deficiency anemia type Patient has establish an appointment with gastroenterologist this month.  And colonoscopy to follow. Start iron polysaccharide Monday Wednesday Friday.  Cautioned on constipation, and may need to use a stool softener. Follow-up in 3 months   Hypokalemia/Hyponatremia Very mild hypokalemia and hyponatremia.  She has been low normal on prior collections routinely.  Encouraged her to start a daily woman's vitamin and/or drink a low sugar sports drinks such as Gatorade daily or Zico coconut water.  Stage 3a chronic kidney disease Improved mildly.  Creatinine is now normal at 1.04.  GFR 56. We will continue to monitor closely and repeat at her 52-month follow-up. If worsens acutely, or she has abdominal/back pain would want to see her immediately to rule out kidney as possible cause.  Pleural effusion: Patient was diagnosed with pneumonia and pleural effusion in July when she presented for extreme left upper quadrant pain and sent to the emergency room in which a CT was completed.  She did not follow-up for repeat chest x-ray.  Will order for her today and encouraged her to have completed.   Reviewed expectations re: course of current medical issues.  Discussed self-management of symptoms.  Outlined signs and symptoms indicating need for more acute intervention.  Patient verbalized understanding and all questions were answered.  Patient received an After-Visit Summary.    No orders of the defined  types were placed in this encounter.  > 25 minutes spent with patient, >50% of time spent face to face     Note is dictated utilizing voice recognition software. Although note has been proof read prior to signing, occasional typographical errors still can be missed. If any questions arise, please do not hesitate to call for verification.   electronically signed by:  Howard Pouch, DO  [place]

## 2019-09-10 NOTE — Telephone Encounter (Signed)
Patient was seen today via virtual video.  Could you please call her and inform her we would want to follow-up in 3 months to double check on her kidney function and check her iron levels at that time.  Please schedule her for 3 months with provider.  We will also provide her Shingrix No. 2 on that day.   In addition, I have noticed that she did not end up scheduling a follow-up after her pneumonia.  I have ordered a chest x-ray for her to have completed at [facility] on [facility].  They will call her to schedule this.  It is encouraged that she have a repeat chest x-ray to ensure there were no underlying causes for her pneumonia/pleural effusion.  We will call her with the results once received.

## 2019-09-10 NOTE — Patient Instructions (Signed)
Once daily would recommend you drink either a low sugar Gatorade or Zico coconut water to help increase your electrolytes/sodium/potassium-both of those levels are low today.  Your kidney function improved.  We will continue to monitor again at your follow-up appointment in 3 months in which we will reevaluate both your kidney and iron levels.  They will call you to schedule this follow-up.  I have ordered a chest x-ray at [facility] on [facility].  Given that you had pneumonia, pleural effusion (fluid on the lung) and no follow-up was completed, you should have a follow-up x-ray to make sure complete resolution of abnormalities and no other causes for the pleural effusion remain.  They will call you to schedule this.  We will call you with results, no appointment follow-up needed.

## 2019-09-10 NOTE — Telephone Encounter (Signed)
Pt was called and given information. She was scheduled for F/U visit and agreed to go and get chest x-ray.

## 2019-09-11 ENCOUNTER — Encounter: Payer: Self-pay | Admitting: Family Medicine

## 2019-09-12 ENCOUNTER — Encounter: Payer: Self-pay | Admitting: Family Medicine

## 2019-09-12 NOTE — Telephone Encounter (Signed)
Patient sent the following mychart message:     Good morning -I had a question regarding the diagnosis of Stage 3 Kidney Disease-I didn't realize that's what I am diagnosed with and if this is reversible.  (Honestly, the only "Stage 3" anything I am interested in is when I'm crossing a finish line at a race :)).  I think at our virtual visit, when we lost connection several times, I missed some information.  Also I was wondering if I am to reach out to [facility] for follow up chest x-ray or if they will be calling me?   Please advise, thank you

## 2019-09-15 ENCOUNTER — Encounter: Payer: Self-pay | Admitting: Family Medicine

## 2019-09-15 ENCOUNTER — Other Ambulatory Visit: Payer: Self-pay

## 2019-09-15 ENCOUNTER — Ambulatory Visit (AMBULATORY_SURGERY_CENTER): Payer: 59 | Admitting: *Deleted

## 2019-09-15 ENCOUNTER — Telehealth: Payer: Self-pay | Admitting: Internal Medicine

## 2019-09-15 VITALS — Temp 97.5°F | Ht 62.0 in | Wt 141.0 lb

## 2019-09-15 DIAGNOSIS — Z1159 Encounter for screening for other viral diseases: Secondary | ICD-10-CM

## 2019-09-15 DIAGNOSIS — Z1211 Encounter for screening for malignant neoplasm of colon: Secondary | ICD-10-CM

## 2019-09-15 MED ORDER — NA SULFATE-K SULFATE-MG SULF 17.5-3.13-1.6 GM/177ML PO SOLN: ORAL | 0 refills | Status: DC

## 2019-09-15 NOTE — Progress Notes (Signed)
Patient is here in-person for PV. Patient denies any allergies to eggs or soy. Patient denies any problems with anesthesia/sedation. Post-op nausea and vomiting per pt. Patient denies any oxygen use at home. Patient denies taking any diet/weight loss medications or blood thinners. Patient is not being treated for MRSA or C-diff. EMMI education assisgned to the patient for the procedure, this was explained and instructions given to patient. COVID-19 screening test is on 11/25, the pt is aware. Pt is aware that care partner will wait in the car during procedure; if they feel like they will be too hot or cold to wait in the car; they may wait in the 4 th floor lobby. Patient is aware to bring only one care partner. We want them to wear a mask (we do not have any that we can provide them), practice social distancing, and we will check their temperatures when they get here.  I did remind the patient that their care partner needs to stay in the parking lot the entire time and have a cell phone available, we will call them when the pt is ready for discharge. Patient will wear mask into building.    Suprep $15 off coupon given to the patient.

## 2019-09-15 NOTE — Telephone Encounter (Signed)
Pt called and is unable to get off work on 09-24-19 for covid test. No other times available in that day 11/25, so new appointment made for 09/23/2019 at 8 am. Pt is aware

## 2019-09-15 NOTE — Telephone Encounter (Signed)
Pls call pt, she needs to r/s her Covid test to a different time that day.

## 2019-09-16 ENCOUNTER — Encounter: Payer: Self-pay | Admitting: Internal Medicine

## 2019-09-17 NOTE — Telephone Encounter (Signed)
Called patient and she verbalized understanding. Patient has her 3 month appointment scheduled.

## 2019-09-17 NOTE — Telephone Encounter (Signed)
Xray - she can go during hours of operation- please tell her those hours. The order was placed prior.  CKD3 (or kidney disease) is oddly named, I agree. But is a reflection of the the kidney function- which we were monitoring and have many potential causes. We need to monitor and ensure not worsening. Continued control of sugars and BP (which she has) are preventive measures, as well as avoiding NSAIDS. She was asked to follow up in 3 mos for recheck- pls make sure she has that scheduled.    thanks

## 2019-09-19 ENCOUNTER — Ambulatory Visit
Admission: RE | Admit: 2019-09-19 | Discharge: 2019-09-19 | Disposition: A | Discharge status: Home / Self Care | Payer: 59 | Source: Ambulatory Visit | Attending: Family Medicine | Admitting: Family Medicine

## 2019-09-19 ENCOUNTER — Other Ambulatory Visit: Payer: Self-pay

## 2019-09-19 DIAGNOSIS — J90 Pleural effusion, not elsewhere classified: Secondary | ICD-10-CM

## 2019-09-19 DIAGNOSIS — J189 Pneumonia, unspecified organism: Secondary | ICD-10-CM

## 2019-09-19 IMAGING — CR DG CHEST 2V
2 series · 2 of 2 positions shown · non-contrast
Comparison: CT dated [DATE].

CLINICAL DATA: Pleural effusion.  Pneumonia.

EXAM:
CHEST - 2 VIEW

[w chest pa]
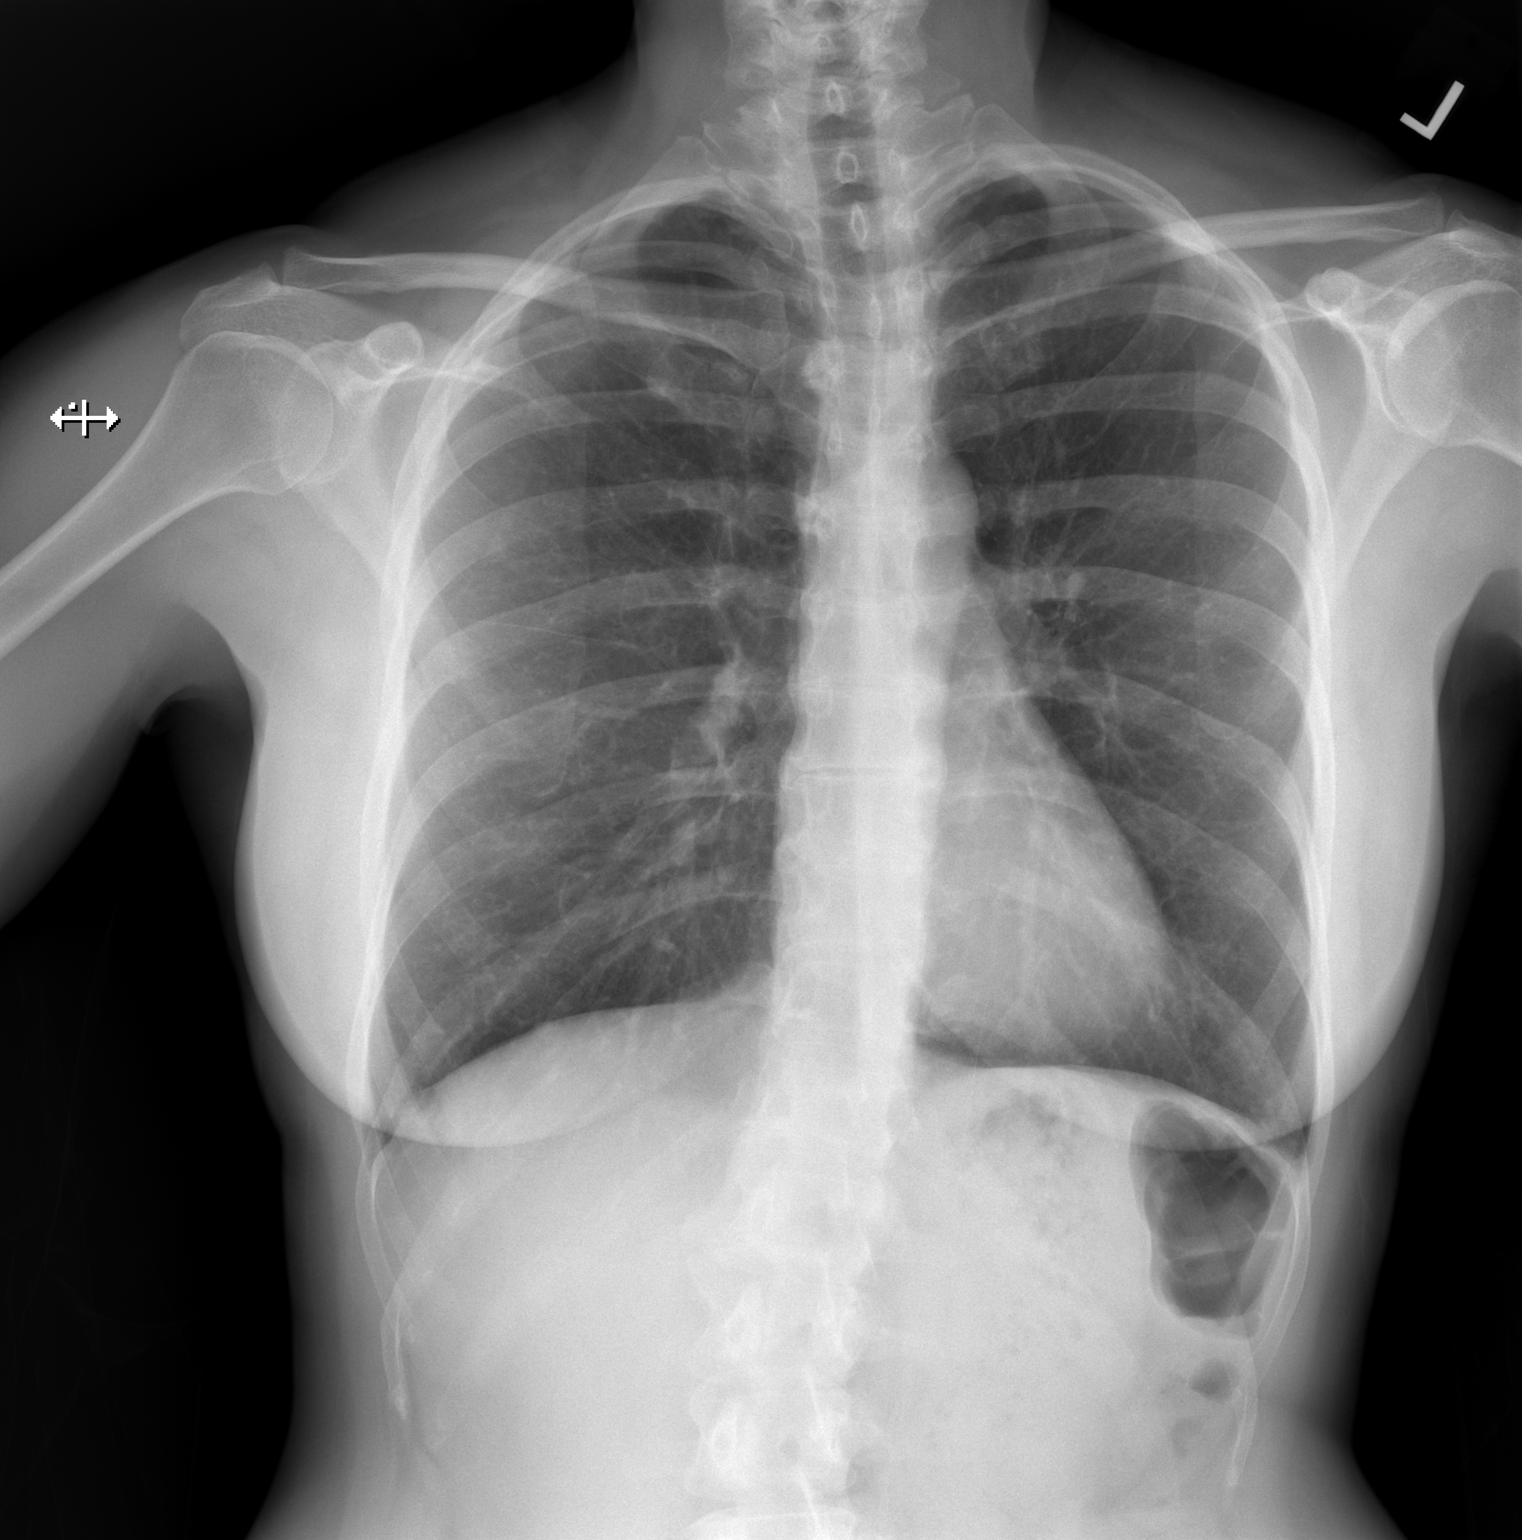

[w chest lat]
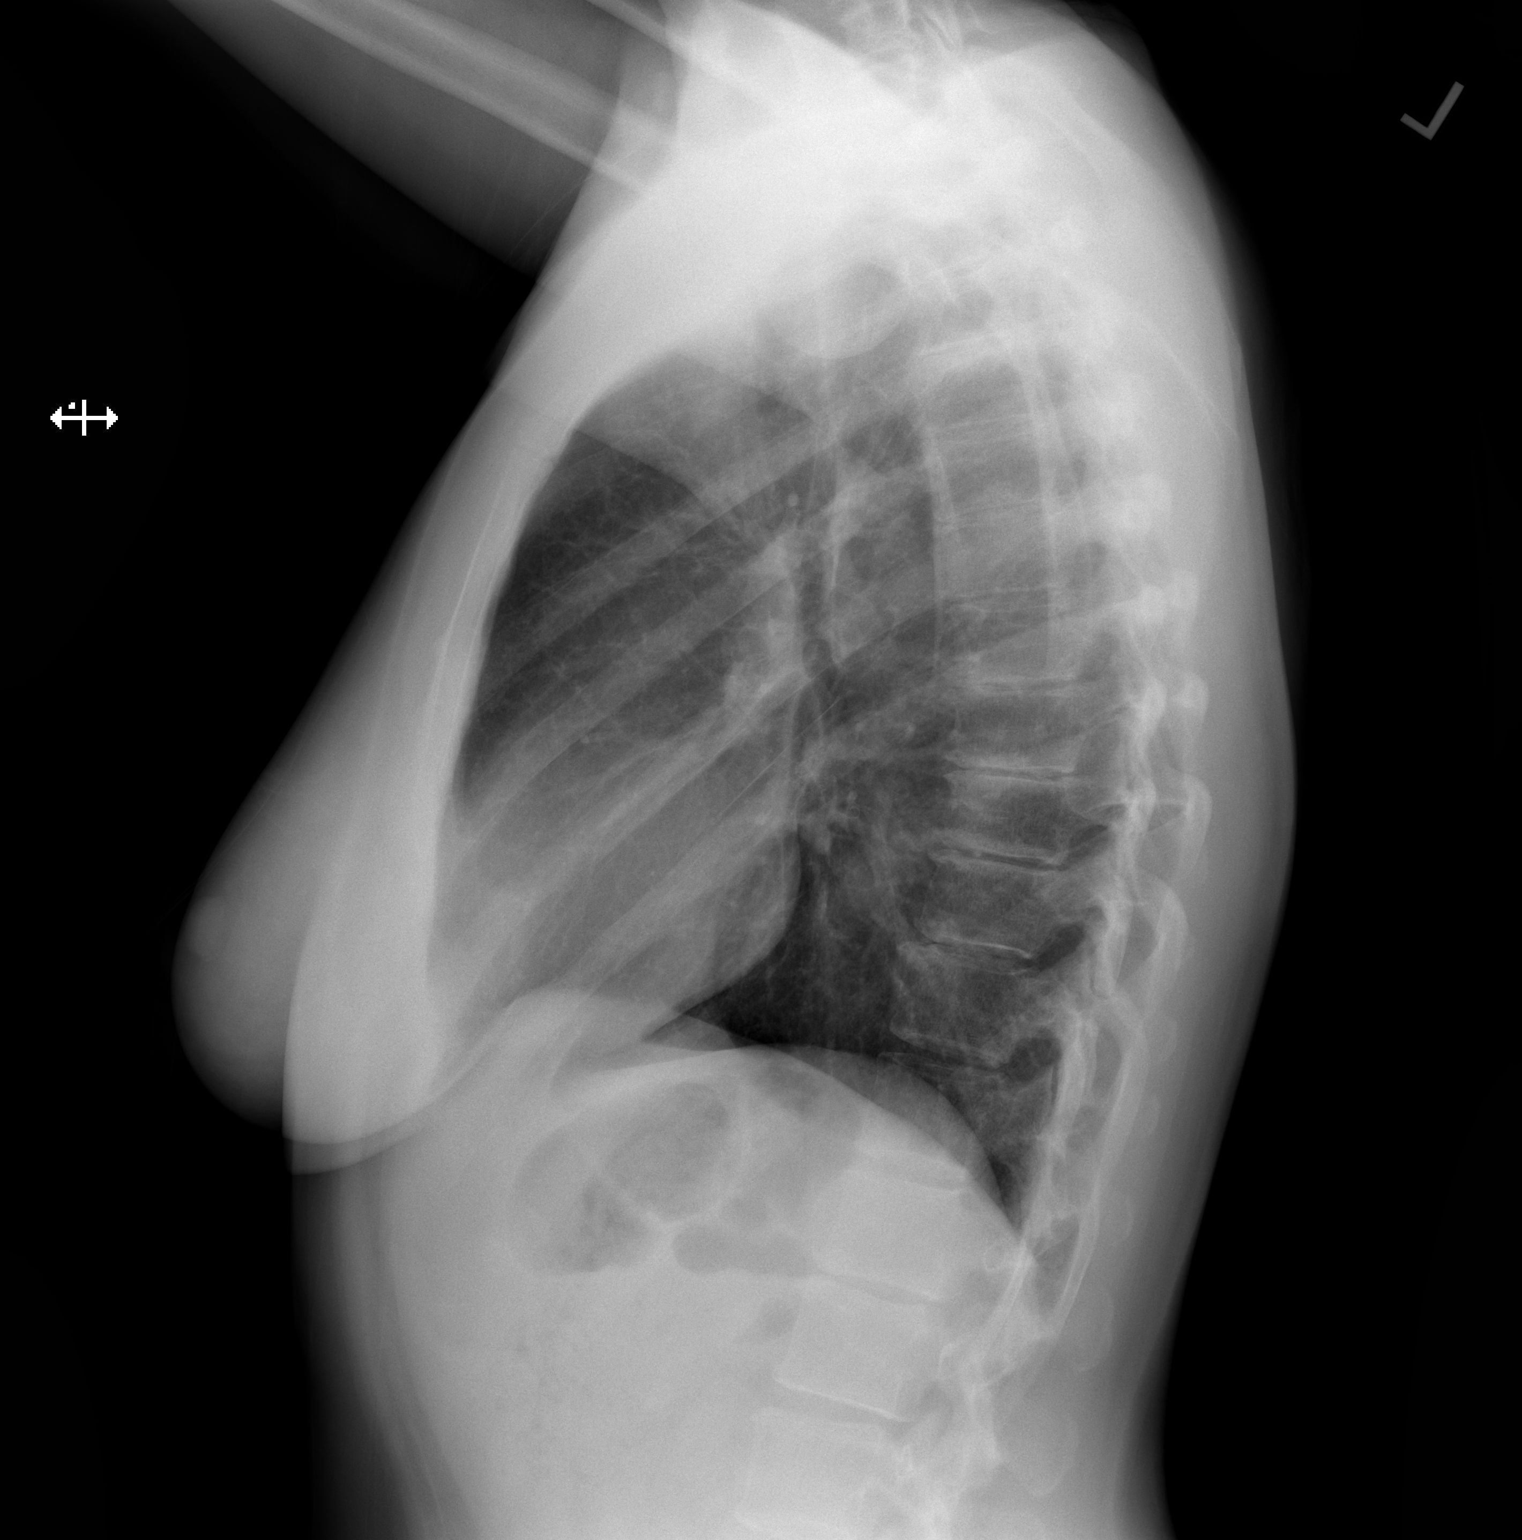

[2 of 2 positions shown; findings below may reference images not displayed]

FINDINGS: The heart size and mediastinal contours are within normal limits.
Both lungs are clear. The visualized skeletal structures are
unremarkable.
IMPRESSION: No active cardiopulmonary disease.

## 2019-09-23 ENCOUNTER — Other Ambulatory Visit: Payer: Self-pay | Admitting: Internal Medicine

## 2019-09-23 ENCOUNTER — Ambulatory Visit (INDEPENDENT_AMBULATORY_CARE_PROVIDER_SITE_OTHER): Payer: 59

## 2019-09-23 DIAGNOSIS — Z1159 Encounter for screening for other viral diseases: Secondary | ICD-10-CM

## 2019-09-23 LAB — SARS CORONAVIRUS 2 (TAT 6-24 HRS): SARS Coronavirus 2: NEGATIVE

## 2019-09-29 ENCOUNTER — Other Ambulatory Visit: Payer: Self-pay

## 2019-09-29 ENCOUNTER — Ambulatory Visit (AMBULATORY_SURGERY_CENTER): Payer: 59 | Admitting: Internal Medicine

## 2019-09-29 ENCOUNTER — Encounter: Payer: Self-pay | Admitting: Internal Medicine

## 2019-09-29 VITALS — BP 120/71 | HR 55 | Temp 97.8°F | Resp 15 | Ht 62.0 in | Wt 141.0 lb

## 2019-09-29 DIAGNOSIS — Z1211 Encounter for screening for malignant neoplasm of colon: Secondary | ICD-10-CM

## 2019-09-29 DIAGNOSIS — D120 Benign neoplasm of cecum: Secondary | ICD-10-CM

## 2019-09-29 MED ORDER — SODIUM CHLORIDE 0.9 % IV SOLN: 500.0000 mL | Freq: Once | INTRAVENOUS | Status: DC

## 2019-09-29 NOTE — Progress Notes (Signed)
Report to PACU, RN, vss, BBS= Clear.  

## 2019-09-29 NOTE — Progress Notes (Signed)
Called to room to assist during endoscopic procedure.  Patient ID and intended procedure confirmed with present staff. Received instructions for my participation in the procedure from the performing physician.  

## 2019-09-29 NOTE — Progress Notes (Signed)
Pt's states no medical or surgical changes since previsit or office visit. 

## 2019-09-29 NOTE — Patient Instructions (Signed)
HANDOUTS PROVIDED ON: POLYPS & HEMORRHOIDS  THE POLYPS TAKEN TODAY HAVE BEEN SENT FOR PATHOLOGY.  THE RESULTS CAN TAKE 2-3 WEEKS TO RECEIVE.  BASED ON THE RESULTS IS WHEN YOUR NEXT COLONOSCOPY WILL BE RECOMMENDED.  YOU MAY RESUME YOUR PREVIOUS DIET AND MEDICATION SCHEDULE.  Jeddito YOU FOR ALLOWING [place] TO CARE FOR YOU TODAY!!!  YOU HAD AN ENDOSCOPIC PROCEDURE TODAY AT [facility]:   Refer to the procedure report that was given to you for any specific questions about what was found during the examination.  If the procedure report does not answer your questions, please call your gastroenterologist to clarify.  If you requested that your care partner not be given the details of your procedure findings, then the procedure report has been included in a sealed envelope for you to review at your convenience later.  YOU SHOULD EXPECT: Some feelings of bloating in the abdomen. Passage of more gas than usual.  Walking can help get rid of the air that was put into your GI tract during the procedure and reduce the bloating. If you had a lower endoscopy (such as a colonoscopy or flexible sigmoidoscopy) you may notice spotting of blood in your stool or on the toilet paper. If you underwent a bowel prep for your procedure, you may not have a normal bowel movement for a few days.  Please Note:  You might notice some irritation and congestion in your nose or some drainage.  This is from the oxygen used during your procedure.  There is no need for concern and it should clear up in a day or so.  SYMPTOMS TO REPORT IMMEDIATELY:   Following lower endoscopy (colonoscopy or flexible sigmoidoscopy):  Excessive amounts of blood in the stool  Significant tenderness or worsening of abdominal pains  Swelling of the abdomen that is new, acute  Fever of 100F or higher  For urgent or emergent issues, a gastroenterologist can be reached at any hour by calling [phone].   DIET:  We do recommend a small  meal at first, but then you may proceed to your regular diet.  Drink plenty of fluids but you should avoid alcoholic beverages for 24 hours.  ACTIVITY:  You should plan to take it easy for the rest of today and you should NOT DRIVE or use heavy machinery until tomorrow (because of the sedation medicines used during the test).    FOLLOW UP: Our staff will call the number listed on your records 48-72 hours following your procedure to check on you and address any questions or concerns that you may have regarding the information given to you following your procedure. If we do not reach you, we will leave a message.  We will attempt to reach you two times.  During this call, we will ask if you have developed any symptoms of COVID 19. If you develop any symptoms (ie: fever, flu-like symptoms, shortness of breath, cough etc.) before then, please call [phone].  If you test positive for Covid 19 in the 2 weeks post procedure, please call and report this information to [place].    If any biopsies were taken you will be contacted by phone or by letter within the next 1-3 weeks.  Please call us at [phone] if you have not heard about the biopsies in 3 weeks.    SIGNATURES/CONFIDENTIALITY: You and/or your care partner have signed paperwork which will be entered into your electronic medical record.  These signatures attest to the fact  that that the information above on your After Visit Summary has been reviewed and is understood.  Full responsibility of the confidentiality of this discharge information lies with you and/or your care-partner.

## 2019-09-29 NOTE — Op Note (Signed)
[place] Patient Name: Sydney [name] Procedure Date: 09/29/2019 7:52 AM MRN: [id number] Endoscopist: Jerene Bears , MD Age: 53 Referring MD:  Date of Birth: 1966-01-14 Gender: Female Account #: [id number] Procedure:                Colonoscopy Indications:              Screening for colorectal malignant neoplasm, This                            is the patient's first colonoscopy Medicines:                Monitored Anesthesia Care Procedure:                Pre-Anesthesia Assessment:                           - Prior to the procedure, a History and Physical                            was performed, and patient medications and                            allergies were reviewed. The patient's tolerance of                            previous anesthesia was also reviewed. The risks                            and benefits of the procedure and the sedation                            options and risks were discussed with the patient.                            All questions were answered, and informed consent                            was obtained. Prior Anticoagulants: The patient has                            taken no previous anticoagulant or antiplatelet                            agents. ASA Grade Assessment: II - A patient with                            mild systemic disease. After reviewing the risks                            and benefits, the patient was deemed in                            satisfactory condition to undergo the procedure.  After obtaining informed consent, the colonoscope                            was passed under direct vision. Throughout the                            procedure, the patient's blood pressure, pulse, and                            oxygen saturations were monitored continuously. The                            Colonoscope was introduced through the anus and                            advanced to the cecum,  identified by appendiceal                            orifice and ileocecal valve. The colonoscopy was                            performed without difficulty. The patient tolerated                            the procedure well. The quality of the bowel                            preparation was good. The ileocecal valve,                            appendiceal orifice, and rectum were photographed. Scope In: 8:09:41 AM Scope Out: 8:30:20 AM Scope Withdrawal Time: 0 hours 15 minutes 27 seconds  Total Procedure Duration: 0 hours 20 minutes 39 seconds  Findings:                 The digital rectal exam was normal.                           A 5 mm polyp was found in the appendiceal orifice.                            The polyp was sessile. The polyp was removed with a                            cold snare. Resection and retrieval were complete.                           A 8 mm polyp was found in the cecum. The polyp was                            sessile. The polyp was removed with a cold snare.  Resection and retrieval were complete.                           Internal hemorrhoids were found during                            retroflexion. The hemorrhoids were small.                           The exam was otherwise without abnormality. Complications:            No immediate complications. Estimated Blood Loss:     Estimated blood loss was minimal. Impression:               - One 5 mm polyp at the appendiceal orifice,                            removed with a cold snare. Resected and retrieved.                           - One 8 mm polyp in the cecum, removed with a cold                            snare. Resected and retrieved.                           - Internal hemorrhoids.                           - The examination was otherwise normal. Recommendation:           - Patient has a contact number available for                            emergencies. The signs and  symptoms of potential                            delayed complications were discussed with the                            patient. Return to normal activities tomorrow.                            Written discharge instructions were provided to the                            patient.                           - Resume previous diet.                           - Continue present medications.                           - Await pathology results.                           -  Repeat colonoscopy is recommended for                            surveillance. The colonoscopy date will be                            determined after pathology results from today's                            exam become available for review. Jerene Bears, MD 09/29/2019 8:32:37 AM This report has been signed electronically.

## 2019-10-01 ENCOUNTER — Telehealth: Payer: Self-pay

## 2019-10-01 NOTE — Telephone Encounter (Signed)
  Follow up Call-  Call back number 09/29/2019  Post procedure Call Back phone  # [phone]  Permission to leave phone message Yes  Some recent data might be hidden     Patient questions:  Do you have a fever, pain , or abdominal swelling? No. Pain Score  0 *  Have you tolerated food without any problems? Yes.    Have you been able to return to your normal activities? Yes.    Do you have any questions about your discharge instructions: Diet   No. Medications  No. Follow up visit  No.  Do you have questions or concerns about your Care? No.  Actions: * If pain score is 4 or above: No action needed, pain <4.  1. Have you developed a fever since your procedure? no  2.   Have you had an respiratory symptoms (SOB or cough) since your procedure? no  3.   Have you tested positive for COVID 19 since your procedure no  4.   Have you had any family members/close contacts diagnosed with the COVID 19 since your procedure?  no   If yes to any of these questions please route to Joylene John, RN and Alphonsa Gin, Therapist, sports.

## 2019-10-05 ENCOUNTER — Encounter: Payer: Self-pay | Admitting: Internal Medicine

## 2019-12-10 ENCOUNTER — Ambulatory Visit: Payer: 59 | Admitting: Family Medicine

## 2019-12-10 ENCOUNTER — Other Ambulatory Visit: Payer: Self-pay

## 2019-12-10 ENCOUNTER — Encounter: Payer: Self-pay | Admitting: Family Medicine

## 2019-12-10 VITALS — BP 135/83 | HR 52 | Temp 98.2°F | Resp 16 | Ht 62.0 in | Wt 139.4 lb

## 2019-12-10 DIAGNOSIS — D509 Iron deficiency anemia, unspecified: Secondary | ICD-10-CM | POA: Diagnosis not present

## 2019-12-10 DIAGNOSIS — N183 Chronic kidney disease, stage 3 unspecified: Secondary | ICD-10-CM

## 2019-12-10 DIAGNOSIS — E871 Hypo-osmolality and hyponatremia: Secondary | ICD-10-CM

## 2019-12-10 DIAGNOSIS — E876 Hypokalemia: Secondary | ICD-10-CM

## 2019-12-10 NOTE — Patient Instructions (Addendum)
Nurse visit: 12/24/2019 at 9:30 for shingrix #2  Chronic Kidney Disease, Adult Chronic kidney disease (CKD) happens when the kidneys are damaged over a long period of time. The kidneys are two organs that help with:  Getting rid of waste and extra fluid from the blood.  Making hormones that maintain the amount of fluid in your tissues and blood vessels.  Making sure that the body has the right amount of fluids and chemicals. Most of the time, CKD does not go away, but it can usually be controlled. Steps must be taken to slow down the kidney damage or to stop it from getting worse. If this is not done, the kidneys may stop working. Follow these instructions at home: Medicines  Take over-the-counter and prescription medicines only as told by your doctor. You may need to change the amount of medicines you take.  Do not take any new medicines unless your doctor says it is okay. Many medicines can make your kidney damage worse.  Do not take any vitamin and supplements unless your doctor says it is okay. Many vitamins and supplements can make your kidney damage worse. General instructions  Follow a diet as told by your doctor. You may need to stay away from: ? Alcohol. ? Salty foods. ? Foods that are high in:  Potassium.  Calcium.  Protein.  Do not use any products that contain nicotine or tobacco, such as cigarettes and e-cigarettes. If you need help quitting, ask your doctor.  Keep track of your blood pressure at home. Tell your doctor about any changes.  If you have diabetes, keep track of your blood sugar as told by your doctor.  Try to stay at a healthy weight. If you need help, ask your doctor.  Exercise at least 30 minutes a day, 5 days a week.  Stay up-to-date with your shots (immunizations) as told by your doctor.  Keep all follow-up visits as told by your doctor. This is important. Contact a doctor if:  Your symptoms get worse.  You have new symptoms. Get help  right away if:  You have symptoms of end-stage kidney disease. These may include: ? Headaches. ? Numbness in your hands or feet. ? Easy bruising. ? Having hiccups often. ? Chest pain. ? Shortness of breath. ? Stopping of menstrual periods in women.  You have a fever.  You have very little pee (urine).  You have pain or bleeding when you pee. Summary  Chronic kidney disease (CKD) happens when the kidneys are damaged over a long period of time.  Most of the time, this condition does not go away, but it can usually be controlled. Steps must be taken to slow down the kidney damage or to stop it from getting worse.  Treatment may include a combination of medicines and lifestyle changes. This information is not intended to replace advice given to you by your health care provider. Make sure you discuss any questions you have with your health care provider. Document Revised: 09/28/2017 Document Reviewed: 11/20/2016 Elsevier Patient Education  2020 [employer].

## 2019-12-10 NOTE — Progress Notes (Signed)
VIRTUAL VISIT VIA VIDEO  I connected with Sydney [name] on 12/15/19 at  2:30 PM EST by a video enabled telemedicine application and verified that I am speaking with the correct person using two identifiers. Location patient: Home Location provider: [facility], Office Persons participating in the virtual visit: Patient, Dr. Raoul Pitch and R.Baker, LPN  I discussed the limitations of evaluation and management by telemedicine and the availability of in person appointments. The patient expressed understanding and agreed to proceed.     [place] , [birth year], 54 y.o., female MRN: [id number] Patient Care Team    Relationship Specialty Notifications Start End  [name], DO PCP - General Family Medicine  12/03/18   [name], MD Consulting Physician Obstetrics and Gynecology  12/03/18     Chief Complaint  Patient presents with  . Chronic Kidney Disease    Needs labs rechecked      Subjective: Sydney [name] is a 54 y.o. female presents today to follow-up on her decreasing/mildly abnormal creatinine and her iron deficiency.  She reports she is taking her iron every other day as prescribed.  He does have some GI side effects with the iron and she is taking stool softeners as needed.  Prior note: Pt presents for an OV for follow-up on decreased kidney function found at her preventative labs.Her creatinine had been 1.11 with a GFR of 51.8.  CBC with hemoglobin 11.3 and hematocrit of 35.3.  Platelets normal.  She reports she feels well, except for when she runs she does notice she gets more fatigued.  Follow-up labs were completed preappointment with normal urinalysis, improved creatinine into normal range at 1.04 and GFR improved to 55.9.  Iron panel resulted with iron 104, TIBC 362, percent saturation 29 and a low ferritin at 10.  Hemoccult cards were completed and negative.  She is scheduled with her gastroenterologist this coming month. Patient  reports she had been on iron in the past, however she stopped using it because it upsets her stomach.  She does not recall which type of iron she was on in the past and believes it was an over-the-counter.  Depression screen [facility] 2/9 08/20/2019 12/03/2018  Decreased Interest 0 0  Down, Depressed, Hopeless 0 0  PHQ - 2 Score 0 0    Allergies  Allergen Reactions  . 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 Anemia   . History of cold sores   . HPV (human papilloma virus) infection   . Post-operative nausea and vomiting    Past Surgical History:  Procedure Laterality Date  . BLEPHAROPLASTY  2005  . CESAREAN SECTION  2002   Family History  Problem Relation Age of Onset  . Lung cancer Maternal Grandmother   . Lung cancer Maternal Grandfather   . Colon cancer Neg Hx   . Colon polyps Neg Hx   . Esophageal cancer Neg Hx   . Rectal cancer Neg Hx   . Stomach cancer Neg Hx    Allergies as  of 12/10/2019      Reactions   Doxycycline Nausea And Vomiting      Medication List       Accurate as of December 10, 2019 11:59 PM. If you have any questions, ask your nurse or doctor.        iron polysaccharides 150 MG capsule Commonly known as: NIFEREX 1 tab PO Monday, Wednesday, Friday   multivitamin tablet Take 1 tablet by mouth daily.   valACYclovir 500 MG tablet Commonly known as: VALTREX Take 500 mg by mouth 2 (two) times daily as needed.       All past medical history, surgical history, allergies, family history, immunizations andmedications were updated in the EMR today and reviewed under the history and medication  portions of their EMR.     ROS: Negative, with the exception of above mentioned in HPI Objective:  BP 135/83 (BP Location: Left Arm, Patient Position: Sitting, Cuff Size: Normal)   Pulse (!) 52   Temp 98.2 F (36.8 C) (Temporal)   Resp 16   Ht 5\' 2"  (1.575 m)   Wt 139 lb 6 oz (63.2 kg)   SpO2 100%   BMI 25.49 kg/m  Body mass index is 25.49 kg/m. Gen: Afebrile. No acute distress.  Nontoxic in presentation. HENT: AT. [address].  Eyes:Pupils Equal Round Reactive to light, Extraocular movements intact,  Conjunctiva without redness, discharge or icterus. Neck/lymp/endocrine: Supple, no lymphadenopathy, no thyromegaly CV: RRR no murmur, no edema Chest: CTAB, no wheeze or crackles Skin: No rashes, purpura or petechiae.  Neuro:  Normal gait. PERLA. EOMi. Alert. Oriented x3  Psych: Normal affect, dress and demeanor. Normal speech. Normal thought content and judgment.    No exam data present No results found. No results found for this or any previous visit (from the past 24 hour(s)).  Assessment/Plan: Sydney [name] is a 54 y.o. female present for OV for  Iron deficiency anemia, unspecified iron deficiency anemia type -Iron panel collected today. -Continue appointments with gastroenterology.   -Continue iron polysaccharide Monday Wednesday Friday.  Cautioned on constipation, and may need to use a stool softener.   Hypokalemia/Hyponatremia/CKD stage IIIa -Continue hydration with low sugar sports drinks.  -BMP collected today.  -Avoid NSAIDs chronically. -Maintain adequate control of blood pressure and sugar levels to avoid progression of chronic kidney disease. -Monitor yearly with you physicals, if worsening would increase monitoring to every 6 months and consider further evaluation.   Reviewed expectations re: course of current medical issues.  Discussed self-management of symptoms.  Outlined signs and symptoms indicating need for more acute intervention.  Patient verbalized  understanding and all questions were answered.  Patient received an After-Visit Summary.  Grix #2 is due.  Patient scheduled for an appointment on 12/24/2019 to receive-postponed secondary to her recent Covid vaccination  Orders Placed This Encounter  Procedures  . Basic Metabolic Panel (BMET)  . Iron, TIBC and Ferritin Panel  No orders of the defined types were placed in this encounter.  Referral Orders  No referral(s) requested today    Note is dictated utilizing voice recognition software. Although note has been proof read prior to signing, occasional typographical errors still can be missed. If any questions arise, please do not hesitate to call for verification.   electronically signed by:  Howard Pouch, DO  [place]

## 2019-12-11 LAB — IRON,TIBC AND FERRITIN PANEL
%SAT: 13 % (calc) — ABNORMAL LOW (ref 16–45)
Ferritin: 27 ng/mL (ref 16–232)
Iron: 41 ug/dL — ABNORMAL LOW (ref 45–160)
TIBC: 322 mcg/dL (calc) (ref 250–450)

## 2019-12-11 LAB — BASIC METABOLIC PANEL
BUN/Creatinine Ratio: 18 (calc) (ref 6–22)
BUN: 19 mg/dL (ref 7–25)
CO2: 27 mmol/L (ref 20–32)
Calcium: 10.1 mg/dL (ref 8.6–10.4)
Chloride: 101 mmol/L (ref 98–110)
Creat: 1.07 mg/dL — ABNORMAL HIGH (ref 0.50–1.05)
Glucose, Bld: 86 mg/dL (ref 65–99)
Potassium: 3.8 mmol/L (ref 3.5–5.3)
Sodium: 137 mmol/L (ref 135–146)

## 2019-12-15 ENCOUNTER — Encounter: Payer: Self-pay | Admitting: Family Medicine

## 2019-12-24 ENCOUNTER — Ambulatory Visit: Payer: 59

## 2019-12-31 ENCOUNTER — Ambulatory Visit (INDEPENDENT_AMBULATORY_CARE_PROVIDER_SITE_OTHER): Payer: 59 | Admitting: Family Medicine

## 2019-12-31 ENCOUNTER — Other Ambulatory Visit: Payer: Self-pay

## 2019-12-31 DIAGNOSIS — Z23 Encounter for immunization: Secondary | ICD-10-CM | POA: Diagnosis not present

## 2020-05-31 LAB — HM MAMMOGRAPHY

## 2020-06-10 LAB — HM PAP SMEAR: HM Pap smear: NORMAL

## 2020-08-26 ENCOUNTER — Other Ambulatory Visit: Payer: Self-pay

## 2020-08-27 ENCOUNTER — Ambulatory Visit (INDEPENDENT_AMBULATORY_CARE_PROVIDER_SITE_OTHER): Payer: 59 | Admitting: Family Medicine

## 2020-08-27 ENCOUNTER — Encounter: Payer: Self-pay | Admitting: Family Medicine

## 2020-08-27 VITALS — BP 121/82 | HR 53 | Temp 98.1°F | Ht 62.0 in | Wt 135.0 lb

## 2020-08-27 DIAGNOSIS — D509 Iron deficiency anemia, unspecified: Secondary | ICD-10-CM

## 2020-08-27 DIAGNOSIS — Z131 Encounter for screening for diabetes mellitus: Secondary | ICD-10-CM

## 2020-08-27 DIAGNOSIS — N1831 Chronic kidney disease, stage 3a: Secondary | ICD-10-CM

## 2020-08-27 DIAGNOSIS — Z130 Encounter for screening for diseases of the blood and blood-forming organs and certain disorders involving the immune mechanism: Secondary | ICD-10-CM

## 2020-08-27 DIAGNOSIS — E871 Hypo-osmolality and hyponatremia: Secondary | ICD-10-CM

## 2020-08-27 DIAGNOSIS — Z0000 Encounter for general adult medical examination without abnormal findings: Secondary | ICD-10-CM

## 2020-08-27 DIAGNOSIS — Z13220 Encounter for screening for lipoid disorders: Secondary | ICD-10-CM

## 2020-08-27 NOTE — Patient Instructions (Signed)
Health Maintenance, Female Adopting a healthy lifestyle and getting preventive care are important in promoting health and wellness. Ask your health care provider about:  The right schedule for you to have regular tests and exams.  Things you can do on your own to prevent diseases and keep yourself healthy. What should I know about diet, weight, and exercise? Eat a healthy diet   Eat a diet that includes plenty of vegetables, fruits, low-fat dairy products, and lean protein.  Do not eat a lot of foods that are high in solid fats, added sugars, or sodium. Maintain a healthy weight Body mass index (BMI) is used to identify weight problems. It estimates body fat based on height and weight. Your health care provider can help determine your BMI and help you achieve or maintain a healthy weight. Get regular exercise Get regular exercise. This is one of the most important things you can do for your health. Most adults should:  Exercise for at least 150 minutes each week. The exercise should increase your heart rate and make you sweat (moderate-intensity exercise).  Do strengthening exercises at least twice a week. This is in addition to the moderate-intensity exercise.  Spend less time sitting. Even light physical activity can be beneficial. Watch cholesterol and blood lipids Have your blood tested for lipids and cholesterol at 52 years of age, then have this test every 5 years. Have your cholesterol levels checked more often if:  Your lipid or cholesterol levels are high.  You are older than 52 years of age.  You are at high risk for heart disease. What should I know about cancer screening? Depending on your health history and family history, you may need to have cancer screening at various ages. This may include screening for:  Breast cancer.  Cervical cancer.  Colorectal cancer.  Skin cancer.  Lung cancer. What should I know about heart disease, diabetes, and high blood  pressure? Blood pressure and heart disease  High blood pressure causes heart disease and increases the risk of stroke. This is more likely to develop in people who have high blood pressure readings, are of African descent, or are overweight.  Have your blood pressure checked: ? Every 3-5 years if you are 18-39 years of age. ? Every year if you are 40 years old or older. Diabetes Have regular diabetes screenings. This checks your fasting blood sugar level. Have the screening done:  Once every three years after age 40 if you are at a normal weight and have a low risk for diabetes.  More often and at a younger age if you are overweight or have a high risk for diabetes. What should I know about preventing infection? Hepatitis B If you have a higher risk for hepatitis B, you should be screened for this virus. Talk with your health care provider to find out if you are at risk for hepatitis B infection. Hepatitis C Testing is recommended for:  Everyone born from 1945 through 1965.  Anyone with known risk factors for hepatitis C. Sexually transmitted infections (STIs)  Get screened for STIs, including gonorrhea and chlamydia, if: ? You are sexually active and are younger than 52 years of age. ? You are older than 52 years of age and your health care provider tells you that you are at risk for this type of infection. ? Your sexual activity has changed since you were last screened, and you are at increased risk for chlamydia or gonorrhea. Ask your health care provider if   you are at risk.  Ask your health care provider about whether you are at high risk for HIV. Your health care provider may recommend a prescription medicine to help prevent HIV infection. If you choose to take medicine to prevent HIV, you should first get tested for HIV. You should then be tested every 3 months for as long as you are taking the medicine. Pregnancy  If you are about to stop having your period (premenopausal) and  you may become pregnant, seek counseling before you get pregnant.  Take 400 to 800 micrograms (mcg) of folic acid every day if you become pregnant.  Ask for birth control (contraception) if you want to prevent pregnancy. Osteoporosis and menopause Osteoporosis is a disease in which the bones lose minerals and strength with aging. This can result in bone fractures. If you are 65 years old or older, or if you are at risk for osteoporosis and fractures, ask your health care provider if you should:  Be screened for bone loss.  Take a calcium or vitamin D supplement to lower your risk of fractures.  Be given hormone replacement therapy (HRT) to treat symptoms of menopause. Follow these instructions at home: Lifestyle  Do not use any products that contain nicotine or tobacco, such as cigarettes, e-cigarettes, and chewing tobacco. If you need help quitting, ask your health care provider.  Do not use street drugs.  Do not share needles.  Ask your health care provider for help if you need support or information about quitting drugs. Alcohol use  Do not drink alcohol if: ? Your health care provider tells you not to drink. ? You are pregnant, may be pregnant, or are planning to become pregnant.  If you drink alcohol: ? Limit how much you use to 0-1 drink a day. ? Limit intake if you are breastfeeding.  Be aware of how much alcohol is in your drink. In the [place] one drink equals one 12 oz bottle of beer (355 mL), one 5 oz glass of wine (148 mL), or one 1 oz glass of hard liquor (44 mL). General instructions  Schedule regular health, dental, and eye exams.  Stay current with your vaccines.  Tell your health care provider if: ? You often feel depressed. ? You have ever been abused or do not feel safe at home. Summary  Adopting a healthy lifestyle and getting preventive care are important in promoting health and wellness.  Follow your health care provider's instructions about healthy  diet, exercising, and getting tested or screened for diseases.  Follow your health care provider's instructions on monitoring your cholesterol and blood pressure. This information is not intended to replace advice given to you by your health care provider. Make sure you discuss any questions you have with your health care provider. Document Revised: 10/09/2018 Document Reviewed: 10/09/2018 Elsevier Patient Education  2020 [employer]  

## 2020-08-27 NOTE — Progress Notes (Signed)
This visit occurred during the SARS-CoV-2 public health emergency.  Safety protocols were in place, including screening questions prior to the visit, additional usage of staff PPE, and extensive cleaning of exam room while observing appropriate contact time as indicated for disinfecting solutions.    Patient ID: Sydney [name], female  DOB: 25-May-1966, 54 y.o.   MRN: [id number] Patient Care Team    Relationship Specialty Notifications Start End  [name], DO PCP - General Family Medicine  12/03/18   [name], MD Consulting Physician Obstetrics and Gynecology  12/03/18     Chief Complaint  Patient presents with  . Annual Exam    CPE; pt is fasting; mammo UTD [place]     Subjective: Sydney [name] is a 54 y.o.  Female  present for CPE . All past medical history, surgical history, allergies, family history, immunizations, medications and social history were updated in the electronic medical record today. All recent labs, ED visits and hospitalizations within the last year were reviewed.  Health maintenance:  Colonoscopy: 08/2019 Dr. Hilarie Fredrickson 5 yr follow up Mammogram: completed:03/2019, Dr. Ouida Sills Cervical cancer screening: last pap: 03/2019, completed by: Dr. Ouida Sills Immunizations: tdap UTD 2020, Influenza 10/21 (encouraged yearly), Shingrix series completed, covid series completed- pfizer Infectious disease screening: HIV declined today- likely completed with pregnancy. hep c declined DEXA: routine screen Assistive device: none Oxygen MEQ:ASTM Patient has a Dental home. Hospitalizations/ED visits: reviewed   History of iron deficiency.  She is currently taking her iron 3 times a week as prescribed.  She has had chronic kidney disease stage III.  She reports she thinks this was back from when she was a runner and was not hydrating enough and drinking Gatorade.  She no longer does either, however her kidney dysfunction has remained.  Depression screen [facility]  2/9 08/27/2020 08/20/2019 12/03/2018  Decreased Interest 0 0 0  Down, Depressed, Hopeless 0 0 0  PHQ - 2 Score 0 0 0   No flowsheet data found.   Immunization History  Administered Date(s) Administered  . Influenza-Unspecified 08/19/2014, 08/04/2020  . PFIZER SARS-COV-2 Vaccination 11/10/2019, 12/01/2019  . Td 08/20/2019  . Zoster Recombinat (Shingrix) 08/20/2019, 12/31/2019    Past Medical History:  Diagnosis Date  . Anemia   . History of cold sores   . HPV (human papilloma virus) infection   . Post-operative nausea and vomiting    Allergies  Allergen Reactions  . Doxycycline Nausea And Vomiting   Past Surgical History:  Procedure Laterality Date  . BLEPHAROPLASTY  2005  . CESAREAN SECTION  2002   Family History  Problem Relation Age of Onset  . Lung cancer Maternal Grandmother   . Lung cancer Maternal Grandfather   . Colon cancer Neg Hx   . Colon polyps Neg Hx   . Esophageal cancer Neg Hx   . Rectal cancer Neg Hx   . 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 If you have any questions, ask your nurse or doctor.        STOP taking these medications   iron polysaccharides 150 MG capsule Commonly known as: NIFEREX Stopped by: Howard Pouch, DO     TAKE these medications   Junel 1/20 1-20 MG-MCG tablet Generic drug: norethindrone-ethinyl estradiol Take 1 tablet by mouth daily.   multivitamin tablet Take 1 tablet by mouth daily.   valACYclovir 500 MG tablet Commonly known as: VALTREX Take 500 mg  by mouth 2 (two) times daily as needed.       All past medical history, surgical history, allergies, family history, immunizations andmedications were updated in the EMR today and reviewed under the history and medication portions of their EMR.     No results found for this or any previous visit (from the past 2160 hour(s)).   ROS: 14 pt review of systems performed and negative (unless mentioned in an HPI)  Objective: BP 121/82   Pulse (!) 53   Temp 98.1 F (36.7 C) (Oral)   Ht 5\' 2"  (1.575 m)   Wt 135 lb (61.2 kg)   SpO2 98%   BMI 24.69 kg/m  Gen: Afebrile. No acute distress. Nontoxic in appearance, well-developed, well-nourished, pleasant female HENT: AT. [place]. Bilateral TM visualized and normal in appearance, normal external auditory canal. MMM, no oral lesions, adequate dentition. Bilateral nares within normal limits. Throat without erythema, ulcerations or exudates.  No cough on exam, no hoarseness on exam. Eyes:Pupils Equal Round Reactive to light, Extraocular movements intact,  Conjunctiva without redness, discharge or icterus. Neck/lymp/endocrine: Supple, no lymphadenopathy, no thyromegaly CV: RRR no murmur, no edema, +2/4 P posterior tibialis pulses.  Chest: CTAB, no wheeze, rhonchi or crackles.  Normal respiratory effort.  Good air movement. Abd: Soft.  Flat. NTND. BS present.  No masses palpated. No hepatosplenomegaly. No rebound tenderness or guarding. Skin: No rashes, purpura or petechiae. Warm and well-perfused. Skin intact. Neuro/Msk:  Normal gait. PERLA. EOMi. Alert. Oriented x3.  Cranial nerves II through XII intact. Muscle strength 5/5 upper/lower extremity. DTRs equal bilaterally. Psych: Normal affect, dress and demeanor. Normal speech. Normal thought content and judgment.   No exam data present  Assessment/plan: Sydney [name] is a 54 y.o. female present for  CPE  Stage 3a chronic kidney disease ([place]) - avoid NSAIDS.  -Maintain adequate control of  blood pressure and sugar levels to avoid progression of chronic kidney disease. - Comprehensive metabolic panel - Urine Microalbumin w/creat. ratio Iron deficiency anemia, unspecified iron deficiency anemia type - CBC with Differential/Platelet - Iron, TIBC and Ferritin Panel Diabetes mellitus screening - Hemoglobin A1c Screening cholesterol level - Lipid panel Encounter for preventive health examination Patient was encouraged to exercise greater than 150 minutes a week. Patient was encouraged to choose a diet filled with fresh fruits and vegetables, and lean meats. AVS provided to patient today for education/recommendation on gender specific health and safety maintenance. Colonoscopy: 08/2019 Dr. Hilarie Fredrickson 5 yr follow up Mammogram: completed:03/2019, Dr. Ouida Sills Cervical cancer screening: last pap: 03/2019, completed by: Dr. Ouida Sills Immunizations: tdap UTD 2020, Influenza 10/21 (encouraged yearly), Shingrix series completed, covid series completed- [employer]fizer Infectious disease screening: HIV declined today- likely completed with pregnancy. hep c declined DEXA: routine screen  Return in about 6 months (around 02/21/2021).   Orders Placed This Encounter  Procedures  . CBC with Differential/Platelet  . Comprehensive metabolic panel  . Hemoglobin A1c  . Lipid panel  .  Iron, TIBC and Ferritin Panel  . Urine Microalbumin w/creat. ratio   No orders of the defined types were placed in this encounter.  Referral Orders  No referral(s) requested today     Electronically signed by: Howard Pouch, [place]

## 2020-08-28 LAB — CBC WITH DIFFERENTIAL/PLATELET
Absolute Monocytes: 398 cells/uL (ref 200–950)
Basophils Absolute: 42 cells/uL (ref 0–200)
Basophils Relative: 0.8 %
Eosinophils Absolute: 21 cells/uL (ref 15–500)
Eosinophils Relative: 0.4 %
HCT: 40.7 % (ref 35.0–45.0)
Hemoglobin: 13.5 g/dL (ref 11.7–15.5)
Lymphs Abs: 1367 cells/uL (ref 850–3900)
MCH: 30.5 pg (ref 27.0–33.0)
MCHC: 33.2 g/dL (ref 32.0–36.0)
MCV: 91.9 fL (ref 80.0–100.0)
MPV: 11 fL (ref 7.5–12.5)
Monocytes Relative: 7.5 %
Neutro Abs: 3472 cells/uL (ref 1500–7800)
Neutrophils Relative %: 65.5 %
Platelets: 221 10*3/uL (ref 140–400)
RBC: 4.43 10*6/uL (ref 3.80–5.10)
RDW: 13.2 % (ref 11.0–15.0)
Total Lymphocyte: 25.8 %
WBC: 5.3 10*3/uL (ref 3.8–10.8)

## 2020-08-28 LAB — COMPREHENSIVE METABOLIC PANEL
AG Ratio: 1.1 (calc) (ref 1.0–2.5)
ALT: 8 U/L (ref 6–29)
AST: 20 U/L (ref 10–35)
Albumin: 4.5 g/dL (ref 3.6–5.1)
Alkaline phosphatase (APISO): 47 U/L (ref 37–153)
BUN/Creatinine Ratio: 10 (calc) (ref 6–22)
BUN: 11 mg/dL (ref 7–25)
CO2: 27 mmol/L (ref 20–32)
Calcium: 9.6 mg/dL (ref 8.6–10.4)
Chloride: 98 mmol/L (ref 98–110)
Creat: 1.13 mg/dL — ABNORMAL HIGH (ref 0.50–1.05)
Globulin: 4.1 g/dL (calc) — ABNORMAL HIGH (ref 1.9–3.7)
Glucose, Bld: 80 mg/dL (ref 65–99)
Potassium: 3.8 mmol/L (ref 3.5–5.3)
Sodium: 135 mmol/L (ref 135–146)
Total Bilirubin: 0.7 mg/dL (ref 0.2–1.2)
Total Protein: 8.6 g/dL — ABNORMAL HIGH (ref 6.1–8.1)

## 2020-08-28 LAB — HEMOGLOBIN A1C
Hgb A1c MFr Bld: 5.5 % of total Hgb (ref <5.7)
Mean Plasma Glucose: 111 (calc)
eAG (mmol/L): 6.2 (calc)

## 2020-08-28 LAB — LIPID PANEL
Cholesterol: 239 mg/dL — ABNORMAL HIGH (ref <200)
HDL: 79 mg/dL (ref >=50)
LDL Cholesterol (Calc): 143 mg/dL (calc) — ABNORMAL HIGH
Non-HDL Cholesterol (Calc): 160 mg/dL (calc) — ABNORMAL HIGH (ref <130)
Total CHOL/HDL Ratio: 3 (calc) (ref <5.0)
Triglycerides: 73 mg/dL (ref <150)

## 2020-08-28 LAB — IRON,TIBC AND FERRITIN PANEL
%SAT: 38 % (calc) (ref 16–45)
Ferritin: 14 ng/mL — ABNORMAL LOW (ref 16–232)
Iron: 180 ug/dL — ABNORMAL HIGH (ref 45–160)
TIBC: 478 mcg/dL (calc) — ABNORMAL HIGH (ref 250–450)

## 2020-08-28 LAB — MICROALBUMIN / CREATININE URINE RATIO
Creatinine, Urine: 12 mg/dL — ABNORMAL LOW (ref 20–275)
Microalb, Ur: <0.2 mg/dL

## 2020-08-30 ENCOUNTER — Telehealth: Payer: Self-pay | Admitting: Family Medicine

## 2020-08-30 NOTE — Telephone Encounter (Signed)
Please call patient -Liver function is normal -Blood cell counts and electrolytes are normal -Diabetes screening/A1c is normal at 5.5 -Cholesterol panel looks is higher than last year with an LDL of 143, however HDL/good cholesterol is 79 therefore the ratio between the two is good.  No need to start medications at this time.  -Iron levels in the blood are well supplemented and her percent saturation has improved.  Her ferritin/iron stores are still on the lower end.  For now, discontinue iron supplementation for now. Her kidney function is about the same as last year this time, however now her protein levels are higher than normal which is new.   -Recommend follow-up in 4 weeks with provider to repeat the protein levels.  I would encourage her to be well-hydrated for that- appointment.  If still elevated at that time well-hydrated and after iron discontinued, would need to further evaluate causes.  Levels could be off secondary to iron supplement, kidney disorder or other potential cause.

## 2020-08-31 NOTE — Telephone Encounter (Signed)
Has not taken iron supplementation for the last 4 months. Pt scheduled for f/u on protein levels. Encouraged to drink more than her normal amount which she states is typically at least 80 oz if not more because she works out a lot.

## 2020-09-01 NOTE — Telephone Encounter (Signed)
Spoke with regarding labs and instructions. And sent labs instruction to pt via portal

## 2020-09-01 NOTE — Telephone Encounter (Signed)
Patient called back and stated that was a lot of information to take in. She would like someone to call he to review this information again, put this information where she can access it via the portal.

## 2020-09-28 ENCOUNTER — Ambulatory Visit: Payer: 59 | Admitting: Family Medicine

## 2020-09-28 ENCOUNTER — Encounter: Payer: Self-pay | Admitting: Family Medicine

## 2020-09-28 ENCOUNTER — Other Ambulatory Visit: Payer: Self-pay

## 2020-09-28 VITALS — BP 128/71 | HR 57 | Temp 98.0°F | Ht 62.0 in | Wt 141.0 lb

## 2020-09-28 DIAGNOSIS — N1831 Chronic kidney disease, stage 3a: Secondary | ICD-10-CM

## 2020-09-28 DIAGNOSIS — R779 Abnormality of plasma protein, unspecified: Secondary | ICD-10-CM | POA: Diagnosis not present

## 2020-09-28 DIAGNOSIS — R790 Abnormal level of blood mineral: Secondary | ICD-10-CM

## 2020-09-28 NOTE — Progress Notes (Signed)
This visit occurred during the SARS-CoV-2 public health emergency.  Safety protocols were in place, including screening questions prior to the visit, additional usage of staff PPE, and extensive cleaning of exam room while observing appropriate contact time as indicated for disinfecting solutions.    Sydney [name] , [birth year], 54 y.o., female MRN: [id number] Patient Care Team    Relationship Specialty Notifications Start End  [name], DO PCP - General Family Medicine  12/03/18   [name], MD Consulting Physician Obstetrics and Gynecology  12/03/18     Chief Complaint  Patient presents with  . Follow-up    CKD;      Subjective: Pt presents for an OV to follow-up on her increasing creatinine and protein levels.  Patient was seen for routine physical 08/27/2020 and found to have an increase in her total protein to 8.6.  She has not had increase in her protein levels in the past.  Globulin levels were elevated at 4.1.  She reports she has had a very mild elevation in her creatinine over the past 2 years.  She believes this was contributed to when she was running marathons and was not hydrating adequately.  She had an episode where she passed out and was thought to have rhabdomyolysis.  Since that time she has had a very mild increase in her creatinine.  No further work-up on her kidneys had been completed at that time.  Kidney function/creatinine was normal in 2015 per EMR review.  That is the last labs available we will before event she described above.  Patient had a history of iron deficiency, she reported not taking iron in over a year however her iron levels were elevated at her physical in October as well.  Iron 180, TIBC 478, percent saturation 38 and ferritin 14.  Patient has a chronic history of mild anemia, however she had not been anemic on her labs at the end of October.  She did donate blood in early November.  Depression screen [facility] 2/9 08/27/2020 08/20/2019  12/03/2018  Decreased Interest 0 0 0  Down, Depressed, Hopeless 0 0 0  PHQ - 2 Score 0 0 0    Allergies  Allergen Reactions  . 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 Anemia   . History of cold sores   . HPV (human papilloma virus) infection   . Post-operative nausea and vomiting    Past Surgical History:  Procedure Laterality Date  . BLEPHAROPLASTY  2005  . CESAREAN SECTION  2002   Family History  Problem Relation Age of Onset  . Lung cancer Maternal Grandmother   . Lung cancer Maternal Grandfather   . Colon cancer Neg Hx   . Colon polyps Neg Hx   . Esophageal cancer Neg Hx   . Rectal cancer Neg Hx   . Stomach cancer Neg Hx    Allergies as of 09/28/2020      Reactions   Doxycycline Nausea And Vomiting      Medication List  Accurate as of September 28, 2020  3:41 PM. If you have any questions, ask your nurse or doctor.        Junel 1/20 1-20 MG-MCG tablet Generic drug: norethindrone-ethinyl estradiol Take 1 tablet by mouth daily.   multivitamin tablet Take 1 tablet by mouth daily.   valACYclovir 500 MG tablet Commonly known as: VALTREX Take 500 mg by mouth 2 (two) times daily as needed.       All past medical history, surgical history, allergies, family history, immunizations andmedications were updated in the EMR today and reviewed under the history and medication portions of their EMR.     ROS: Negative, with the exception of above mentioned in HPI   Objective:  BP 128/71   Pulse (!) 57   Temp 98 F (36.7 C) (Oral)   Ht 5\' 2"  (1.575 m)   Wt 141 lb (64 kg)   SpO2 100%   BMI 25.79  kg/m  Body mass index is 25.79 kg/m. Gen: Afebrile. No acute distress. Nontoxic in appearance, well developed, well nourished.  HENT: AT. [place].  Eyes:Pupils Equal Round Reactive to light, Extraocular movements intact,  Conjunctiva without redness, discharge or icterus. Neck/lymp/endocrine: Supple, very small right posterior neck lymph node.  Nontender.   CV: RRR, no edema Chest: CTAB, no wheeze or crackles. Good air movement, normal resp effort.  Abd: Soft.NTND. BS present MSK: No CVA tenderness bilaterally  skin: no rashes, purpura or petechiae.  Neuro: Normal gait. PERLA. EOMi. Alert. Oriented x3  Psych: Normal affect, dress and demeanor. Normal speech. Normal thought content and judgment.  No exam data present No results found. No results found for this or any previous visit (from the past 24 hour(s)).  Assessment/Plan: [name] is a 54 y.o. female present for OV for  Stage 3a chronic kidney disease (HCC)/elevated protein Discussed evaluation on her mildly elevated creatinine and elevated protein with her today.  She is agreeable to further laboratory work-up. - Comprehensive metabolic panel - Iron, TIBC and Ferritin Panel - Protein,Total and Electrophor w/IFE-([facility]) - Urinalysis, Routine w reflex microscopic - consider heme consult if abnormalities persist and/or SPEP is abnormal  elevated iron Repeat iron today, patient had not been on iron supplementation at the time of her last labs which showed an elevated iron. Unlikely hemochromatosis with history of iron deficiency.  Sideroblastic anemia?  Lab error?  Related to elevated protein and kidney dysfunction? MM/MGUS? - Iron, TIBC and Ferritin Panel   Reviewed expectations re: course of current medical issues.  Discussed self-management of symptoms.  Outlined signs and symptoms indicating need for more acute intervention.  Patient verbalized understanding and all questions were answered.  Patient received an  After-Visit Summary.    No orders of the defined types were placed in this encounter.  No orders of the defined types were placed in this encounter.  Referral Orders  No referral(s) requested today     Note is dictated utilizing voice recognition software. Although note has been proof read prior to signing, occasional typographical errors still can be missed. If any questions arise, please do not hesitate to call for verification.   electronically signed by:  Howard Pouch, DO  [place]

## 2020-09-28 NOTE — Patient Instructions (Addendum)
We will call you with results once we get them and discuss plan.

## 2020-09-29 LAB — IRON,TIBC AND FERRITIN PANEL
Ferritin: 12 ng/mL — ABNORMAL LOW (ref 15–150)
Iron Saturation: 11 % — ABNORMAL LOW (ref 15–55)
Iron: 45 ug/dL (ref 27–159)
Total Iron Binding Capacity: 422 ug/dL (ref 250–450)
UIBC: 377 ug/dL (ref 131–425)

## 2020-09-29 LAB — URINE CULTURE
MICRO NUMBER:: 11257820
Result:: NO GROWTH
SPECIMEN QUALITY:: ADEQUATE

## 2020-09-29 LAB — URINALYSIS, ROUTINE W REFLEX MICROSCOPIC
Bilirubin Urine: NEGATIVE
Glucose, UA: NEGATIVE
Hgb urine dipstick: NEGATIVE
Ketones, ur: NEGATIVE
Leukocytes,Ua: NEGATIVE
Nitrite: NEGATIVE
Protein, ur: NEGATIVE
Specific Gravity, Urine: 1.004 (ref 1.001–1.03)
pH: 5.5 (ref 5.0–8.0)

## 2020-10-01 LAB — COMPREHENSIVE METABOLIC PANEL
AG Ratio: 1 (calc) (ref 1.0–2.5)
ALT: 14 U/L (ref 6–29)
AST: 28 U/L (ref 10–35)
Albumin: 3.9 g/dL (ref 3.6–5.1)
Alkaline phosphatase (APISO): 48 U/L (ref 37–153)
BUN/Creatinine Ratio: 11 (calc) (ref 6–22)
BUN: 13 mg/dL (ref 7–25)
CO2: 19 mmol/L — ABNORMAL LOW (ref 20–32)
Calcium: 8.7 mg/dL (ref 8.6–10.4)
Chloride: 103 mmol/L (ref 98–110)
Creat: 1.18 mg/dL — ABNORMAL HIGH (ref 0.50–1.05)
Globulin: 4 g/dL (calc) — ABNORMAL HIGH (ref 1.9–3.7)
Glucose, Bld: 72 mg/dL (ref 65–99)
Potassium: 3.9 mmol/L (ref 3.5–5.3)
Sodium: 134 mmol/L — ABNORMAL LOW (ref 135–146)
Total Bilirubin: 0.3 mg/dL (ref 0.2–1.2)
Total Protein: 7.9 g/dL (ref 6.1–8.1)

## 2020-10-01 LAB — PROTEIN,TOTAL AND ELECTROPHOR W/IFE
Albumin ELP: 3.8 g/dL (ref 3.8–4.8)
Alpha 1: 0.5 g/dL — ABNORMAL HIGH (ref 0.2–0.3)
Alpha 2: 0.9 g/dL (ref 0.5–0.9)
Beta 2: 0.4 g/dL (ref 0.2–0.5)
Beta Globulin: 0.6 g/dL (ref 0.4–0.6)
Gamma Globulin: 1.7 g/dL (ref 0.8–1.7)
Immunofix Electr Int: NOT DETECTED
Total Protein: 7.9 g/dL (ref 6.1–8.1)

## 2020-10-06 ENCOUNTER — Encounter: Payer: Self-pay | Admitting: Family Medicine

## 2020-10-06 NOTE — Telephone Encounter (Signed)
Spoke with pt regarding labs and instructions.   

## 2020-10-28 ENCOUNTER — Inpatient Hospital Stay (HOSPITAL_COMMUNITY): Payer: 59

## 2020-10-28 ENCOUNTER — Encounter (HOSPITAL_COMMUNITY): Payer: Self-pay | Admitting: Emergency Medicine

## 2020-10-28 ENCOUNTER — Emergency Department (HOSPITAL_COMMUNITY): Payer: 59

## 2020-10-28 ENCOUNTER — Other Ambulatory Visit: Payer: Self-pay

## 2020-10-28 ENCOUNTER — Inpatient Hospital Stay (HOSPITAL_COMMUNITY)
Admission: EM | Admit: 2020-10-28 | Discharge: 2020-10-31 | DRG: 092 | Disposition: A | Discharge status: Home / Self Care | Payer: 59 | Attending: Internal Medicine | Admitting: Internal Medicine

## 2020-10-28 DIAGNOSIS — E876 Hypokalemia: Secondary | ICD-10-CM | POA: Diagnosis not present

## 2020-10-28 DIAGNOSIS — D649 Anemia, unspecified: Secondary | ICD-10-CM | POA: Diagnosis not present

## 2020-10-28 DIAGNOSIS — N951 Menopausal and female climacteric states: Secondary | ICD-10-CM | POA: Diagnosis present

## 2020-10-28 DIAGNOSIS — G08 Intracranial and intraspinal phlebitis and thrombophlebitis: Principal | ICD-10-CM | POA: Diagnosis present

## 2020-10-28 DIAGNOSIS — N183 Chronic kidney disease, stage 3 unspecified: Secondary | ICD-10-CM | POA: Diagnosis present

## 2020-10-28 DIAGNOSIS — Z8619 Personal history of other infectious and parasitic diseases: Secondary | ICD-10-CM

## 2020-10-28 DIAGNOSIS — N179 Acute kidney failure, unspecified: Secondary | ICD-10-CM | POA: Diagnosis present

## 2020-10-28 DIAGNOSIS — E785 Hyperlipidemia, unspecified: Secondary | ICD-10-CM | POA: Diagnosis present

## 2020-10-28 DIAGNOSIS — H6690 Otitis media, unspecified, unspecified ear: Secondary | ICD-10-CM | POA: Diagnosis present

## 2020-10-28 DIAGNOSIS — Z79899 Other long term (current) drug therapy: Secondary | ICD-10-CM | POA: Diagnosis not present

## 2020-10-28 DIAGNOSIS — Z20822 Contact with and (suspected) exposure to covid-19: Secondary | ICD-10-CM | POA: Diagnosis present

## 2020-10-28 DIAGNOSIS — Z881 Allergy status to other antibiotic agents status: Secondary | ICD-10-CM | POA: Diagnosis not present

## 2020-10-28 DIAGNOSIS — R55 Syncope and collapse: Secondary | ICD-10-CM | POA: Diagnosis not present

## 2020-10-28 DIAGNOSIS — D696 Thrombocytopenia, unspecified: Secondary | ICD-10-CM | POA: Diagnosis not present

## 2020-10-28 DIAGNOSIS — N1831 Chronic kidney disease, stage 3a: Secondary | ICD-10-CM | POA: Diagnosis present

## 2020-10-28 LAB — CBC
HCT: 35.7 % — ABNORMAL LOW (ref 36.0–46.0)
Hemoglobin: 11.8 g/dL — ABNORMAL LOW (ref 12.0–15.0)
MCH: 29.9 pg (ref 26.0–34.0)
MCHC: 33.1 g/dL (ref 30.0–36.0)
MCV: 90.6 fL (ref 80.0–100.0)
Platelets: 102 10*3/uL — ABNORMAL LOW (ref 150–400)
RBC: 3.94 MIL/uL (ref 3.87–5.11)
RDW: 13.9 % (ref 11.5–15.5)
WBC: 10.9 10*3/uL — ABNORMAL HIGH (ref 4.0–10.5)
nRBC: 0 % (ref 0.0–0.2)

## 2020-10-28 LAB — BASIC METABOLIC PANEL
Anion gap: 11 (ref 5–15)
BUN: 14 mg/dL (ref 6–20)
CO2: 21 mmol/L — ABNORMAL LOW (ref 22–32)
Calcium: 8.9 mg/dL (ref 8.9–10.3)
Chloride: 104 mmol/L (ref 98–111)
Creatinine, Ser: 1.37 mg/dL — ABNORMAL HIGH (ref 0.44–1.00)
GFR, Estimated: 46 mL/min — ABNORMAL LOW (ref >60)
Glucose, Bld: 125 mg/dL — ABNORMAL HIGH (ref 70–99)
Potassium: 3.1 mmol/L — ABNORMAL LOW (ref 3.5–5.1)
Sodium: 136 mmol/L (ref 135–145)

## 2020-10-28 LAB — HIV ANTIBODY (ROUTINE TESTING W REFLEX): HIV Screen 4th Generation wRfx: NONREACTIVE

## 2020-10-28 LAB — URINALYSIS, ROUTINE W REFLEX MICROSCOPIC
Bilirubin Urine: NEGATIVE
Glucose, UA: NEGATIVE mg/dL
Hgb urine dipstick: NEGATIVE
Ketones, ur: 5 mg/dL — AB
Leukocytes,Ua: NEGATIVE
Nitrite: NEGATIVE
Protein, ur: NEGATIVE mg/dL
Specific Gravity, Urine: 1.013 (ref 1.005–1.030)
pH: 5 (ref 5.0–8.0)

## 2020-10-28 LAB — RESP PANEL BY RT-PCR (FLU A&B, COVID) ARPGX2
Influenza A by PCR: NEGATIVE
Influenza B by PCR: NEGATIVE
SARS Coronavirus 2 by RT PCR: NEGATIVE

## 2020-10-28 LAB — I-STAT BETA HCG BLOOD, ED (MC, WL, AP ONLY): I-stat hCG, quantitative: <5 m[IU]/mL (ref <5)

## 2020-10-28 LAB — CBG MONITORING, ED: Glucose-Capillary: 120 mg/dL — ABNORMAL HIGH (ref 70–99)

## 2020-10-28 LAB — ANTITHROMBIN III: AntiThromb III Func: 107 % (ref 75–120)

## 2020-10-28 IMAGING — CT CT HEAD W/O CM
4 series · 16 of 47 positions shown, 18 images · non-contrast
Comparison: None.

CLINICAL DATA: Headache, nausea.

EXAM:
CT HEAD WITHOUT CONTRAST
TECHNIQUE: Contiguous axial images were obtained from the base of the skull
through the vertex without intravenous contrast.

[Series 2: head wo · axial · 0.39mm/px · z∈[-128,-18]mm · 7 of 30 slices shown, 9 images]
[im 4/30  brain]
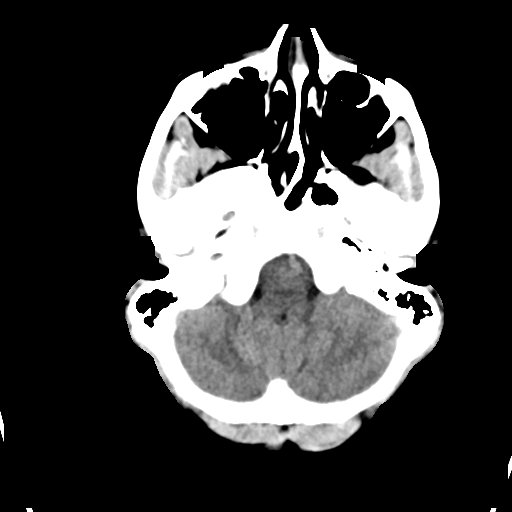
[im 4/30  bone]
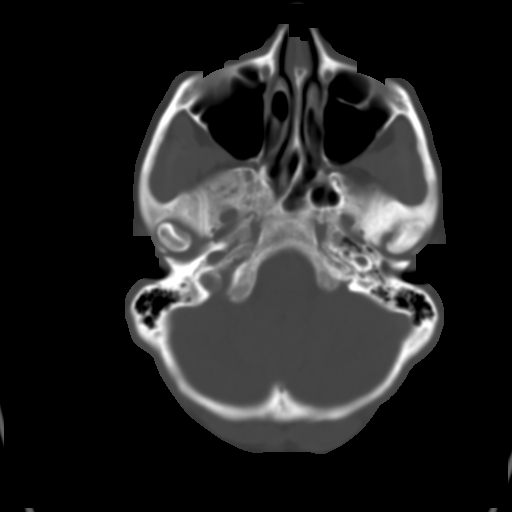
[im 8/30  brain]
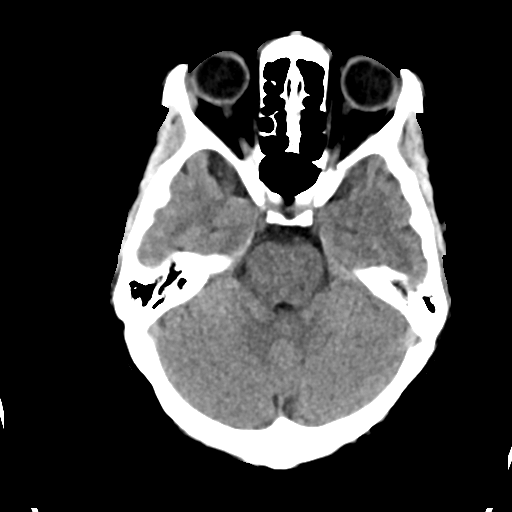
[im 11/30  brain]
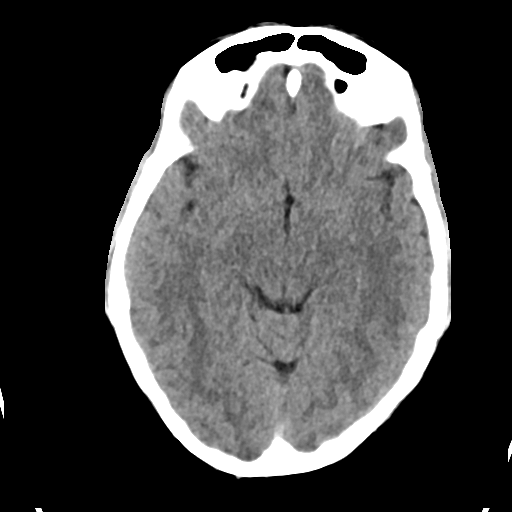
[im 15/30  brain]
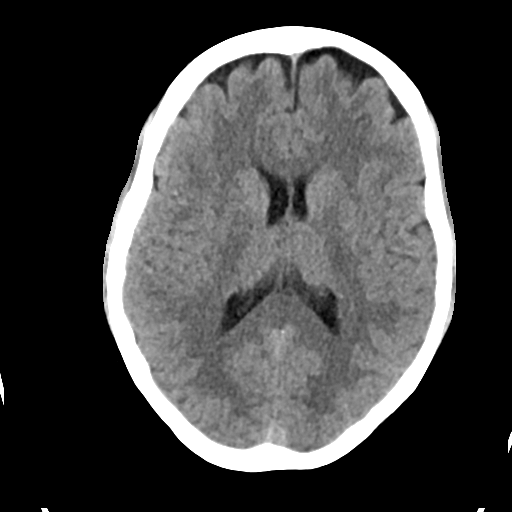
[im 19/30  brain]
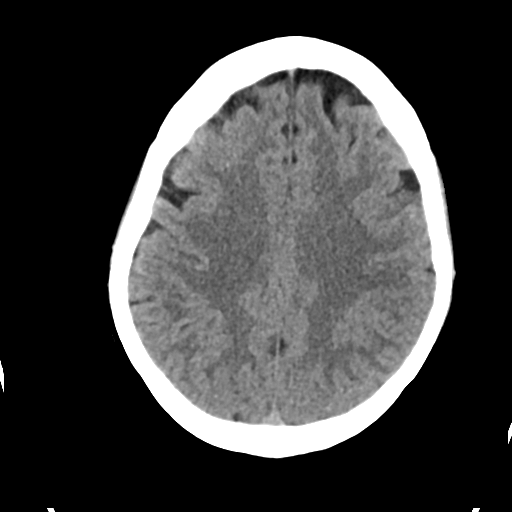
[im 19/30  bone]
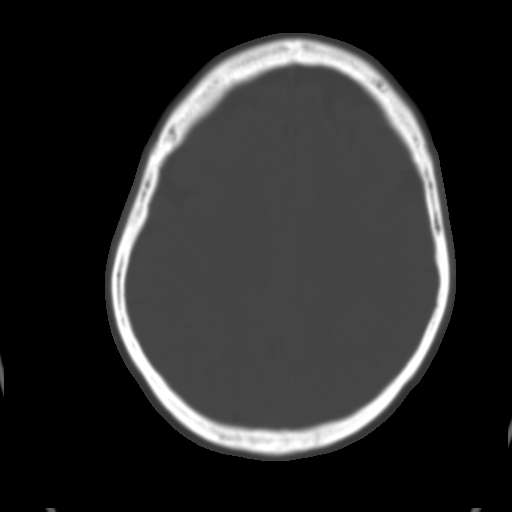
[im 22/30  brain]
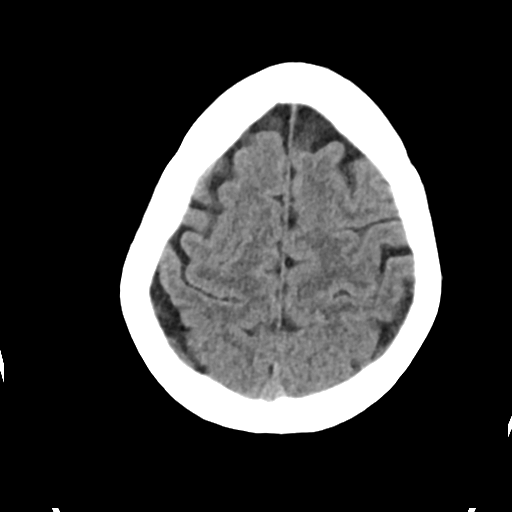
[im 26/30  brain]
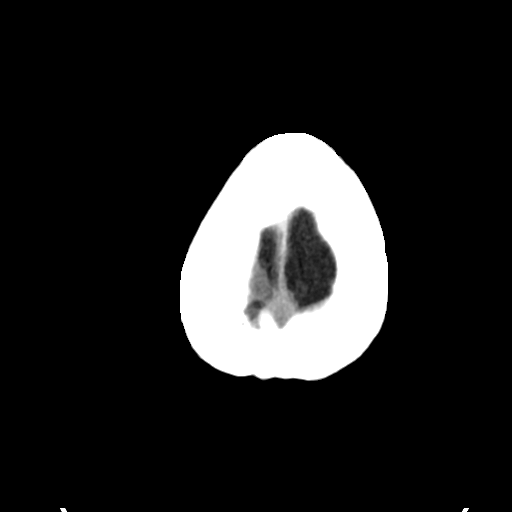

[Series 3: head bone · axial · 0.39mm/px · z∈[-130,-102]mm · 3 of 73 slices shown]
[im 8/73  bone]
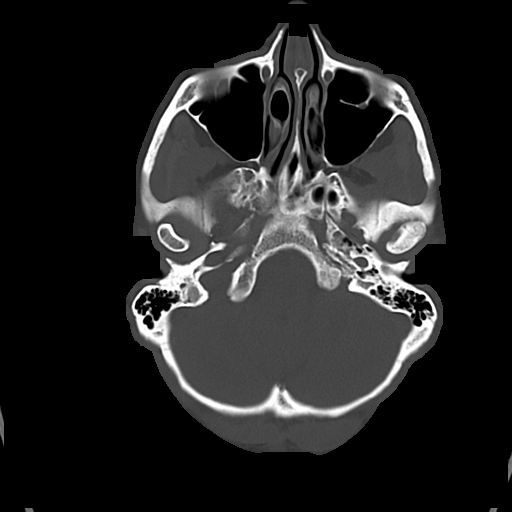
[im 15/73  bone]
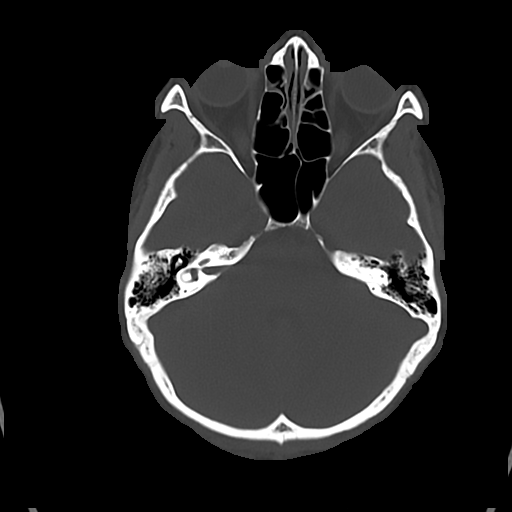
[im 22/73  bone]
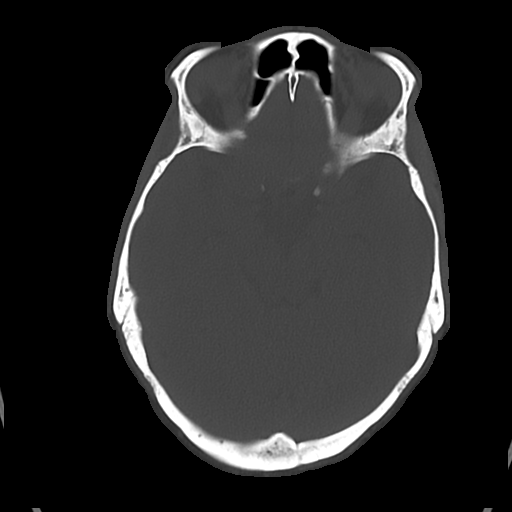

[Series 4: cor soft · coronal · 0.29mm/px · 3 of 64 slices shown]
[im 22/64  brain]
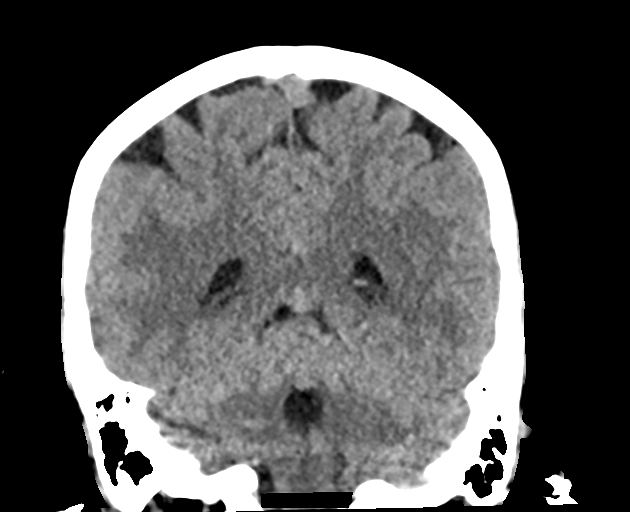
[im 29/64  brain]
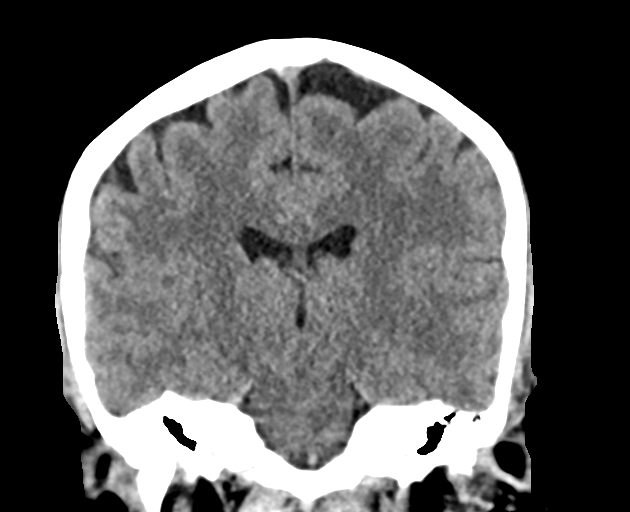
[im 36/64  brain]
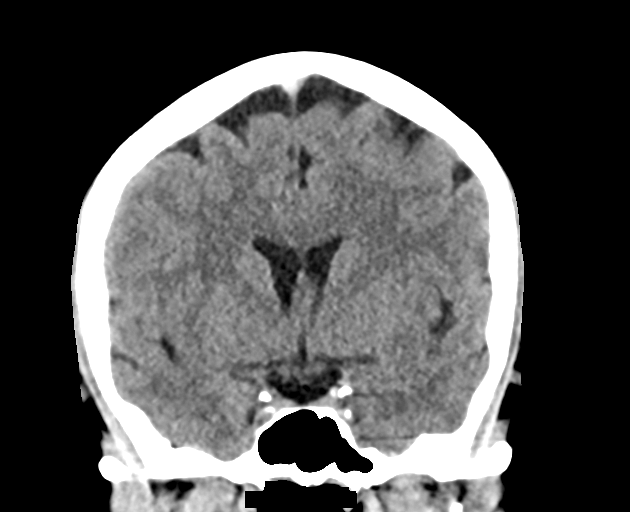

[Series 5: sag soft · sagittal · 0.28mm/px · 3 of 58 slices shown]
[im 20/58  brain]
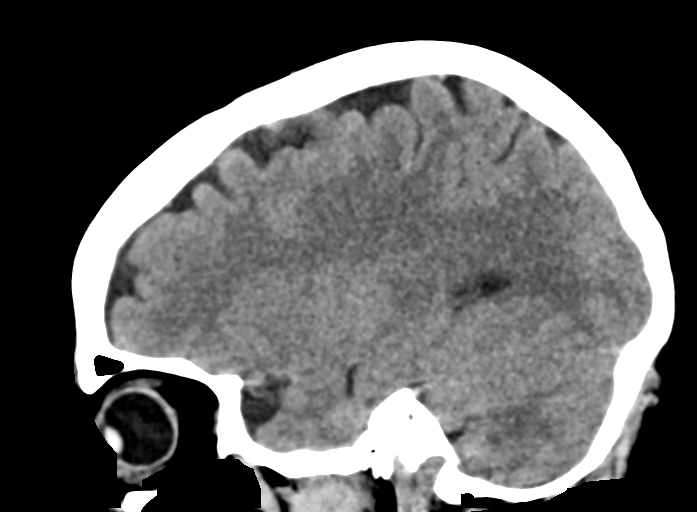
[im 29/58  brain]
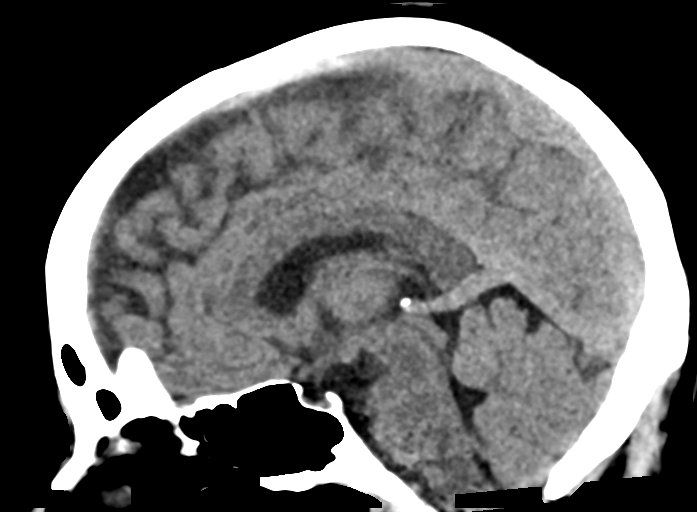
[im 39/58  brain]
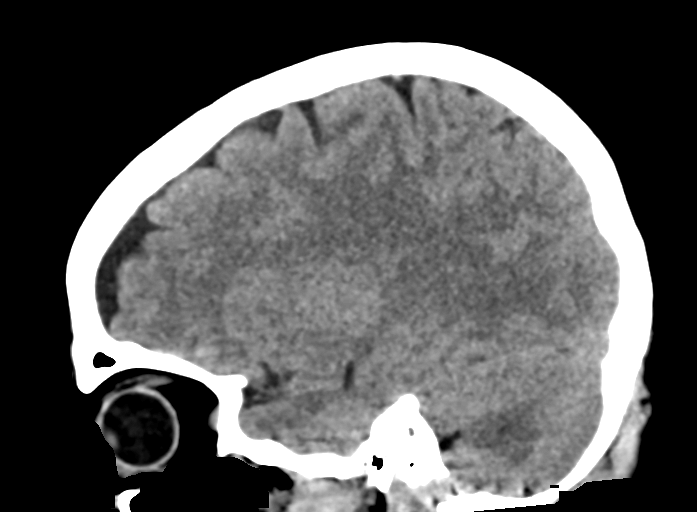

[16 of 47 positions shown; findings below may reference images not displayed]

FINDINGS: Brain: No evidence of acute infarction, hemorrhage, hydrocephalus,
extra-axial collection or mass lesion/mass effect.

Vascular: No hyperdense vessel or unexpected calcification.

Skull: Normal. Negative for fracture or focal lesion.

Sinuses/Orbits: No acute finding.

Other: None.
IMPRESSION: Normal head CT.

## 2020-10-28 IMAGING — CT CT MAXILLOFACIAL W/O CM
3 of 4 series · 16 of 47 positions shown, 19 images · IV contrast (APPLIED)
Comparison: CT head [DATE].  MR venogram [DATE]

CLINICAL DATA: Maxillofacial pain. Headache. Recent history of
cerebral venous sinus thrombosis involving the sagittal sinus and
left sigmoid sinus.

EXAM:
CT MAXILLOFACIAL WITHOUT CONTRAST
TECHNIQUE: Multidetector CT imaging of the maxillofacial structures was
performed. Multiplanar CT image reconstructions were also generated.

[Series 3: facial/orbits w 2.0 st · axial · 0.36mm/px · z∈[-110,+32]mm · 10 of 83 slices shown, 13 images]
[im 6/83  brain]
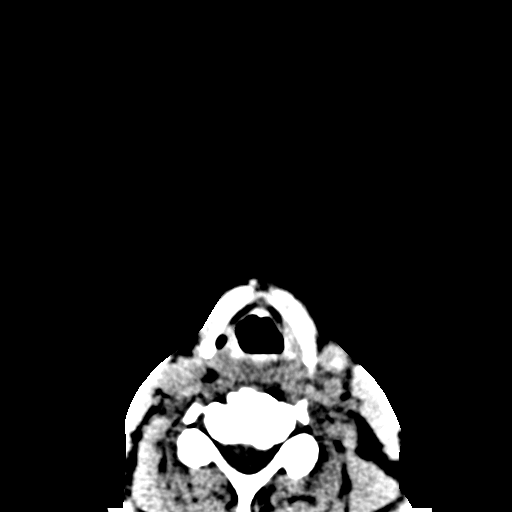
[im 6/83  bone]
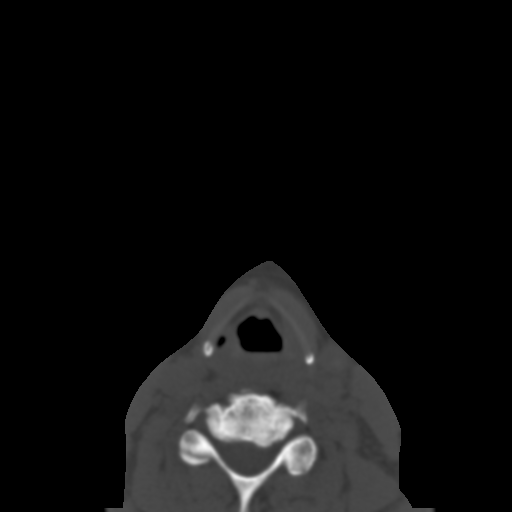
[im 15/83  bone]
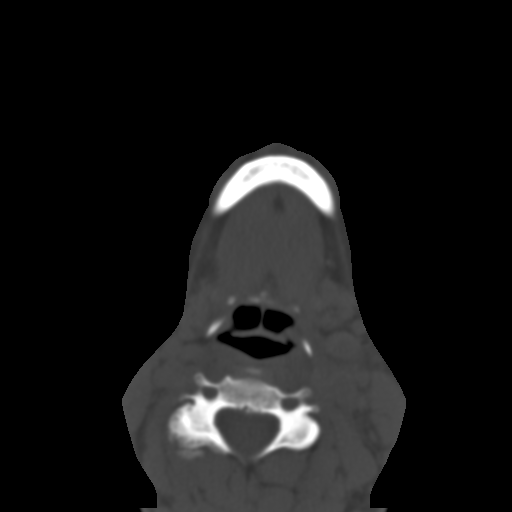
[im 23/83  bone]
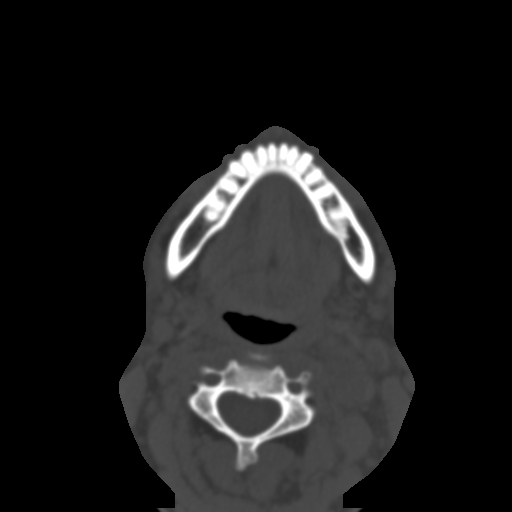
[im 29/83  bone]
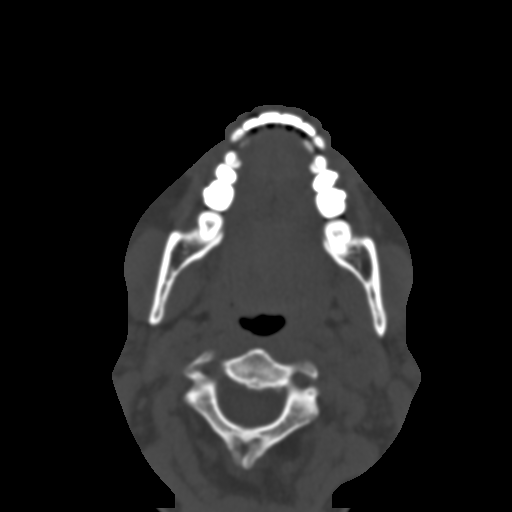
[im 37/83  brain]
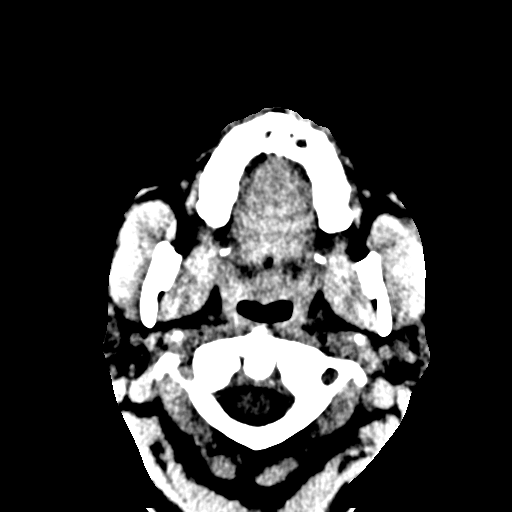
[im 37/83  bone]
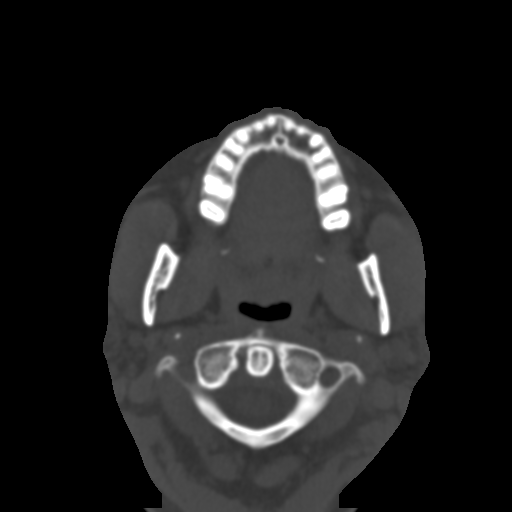
[im 46/83  bone]
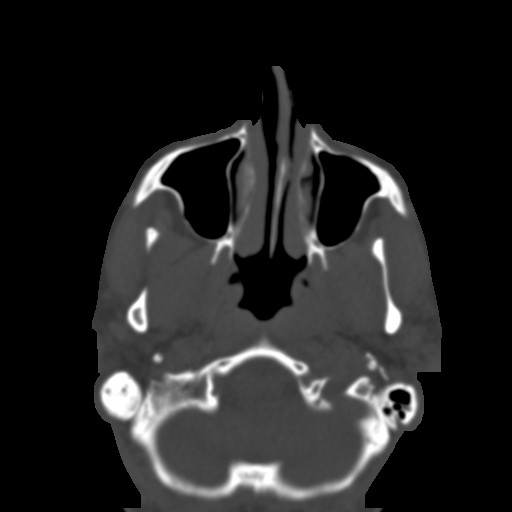
[im 54/83  bone]
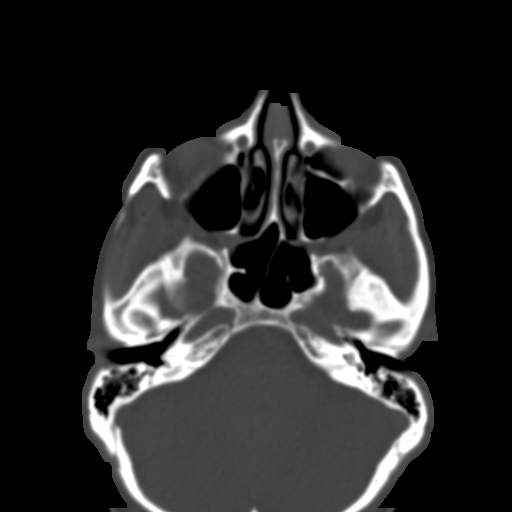
[im 63/83  bone]
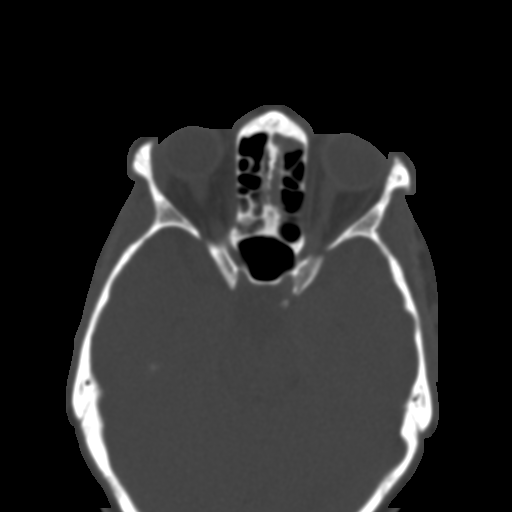
[im 68/83  brain]
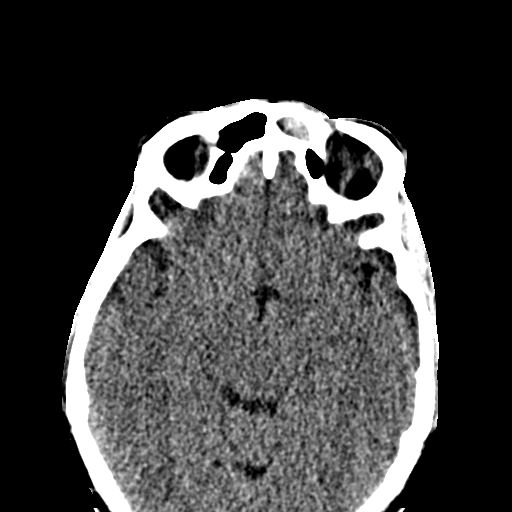
[im 68/83  bone]
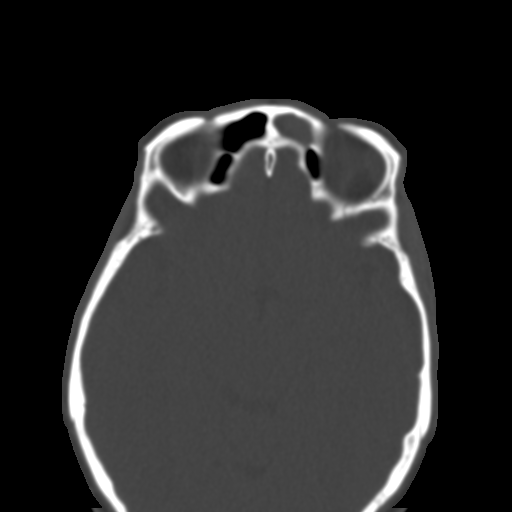
[im 77/83  bone]
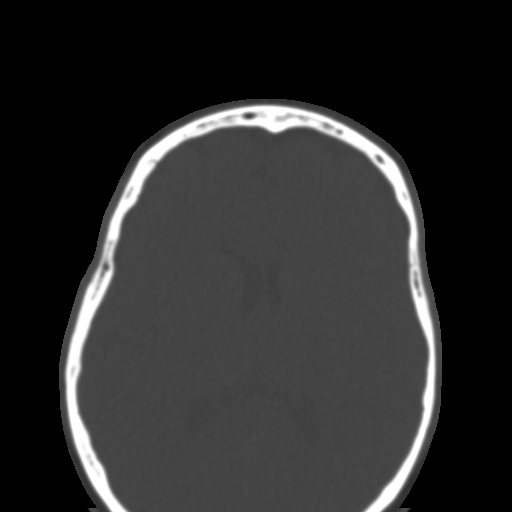

[Series 8: sagittal soft tissue · sagittal · 0.33mm/px · 3 of 91 slices shown]
[im 31/91  bone]
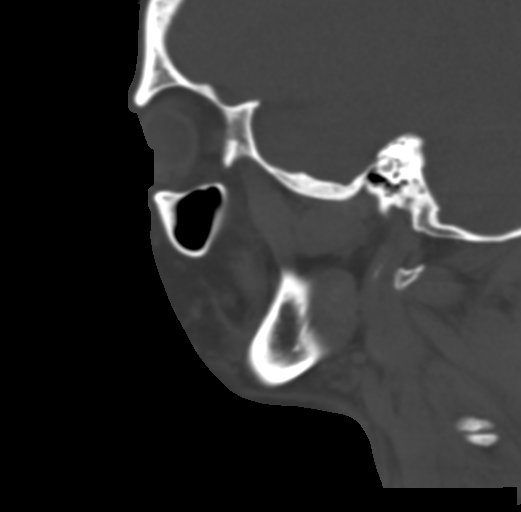
[im 46/91  bone]
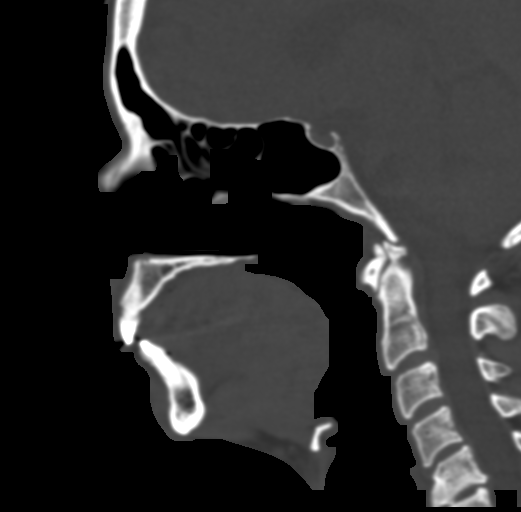
[im 61/91  bone]
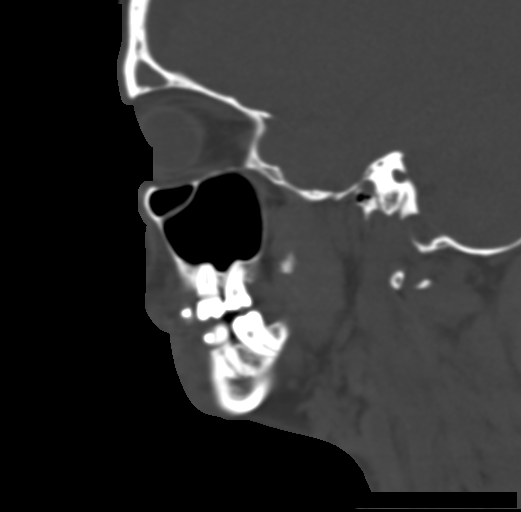

[Series 9: coronal bone · coronal · 0.36mm/px · 3 of 81 slices shown]
[im 21/81  bone]
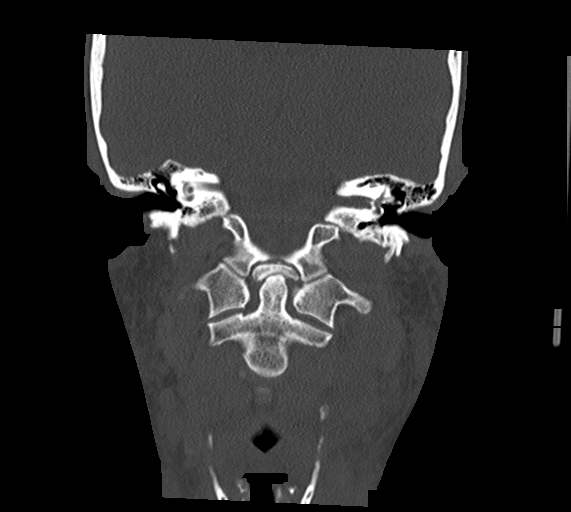
[im 41/81  bone]
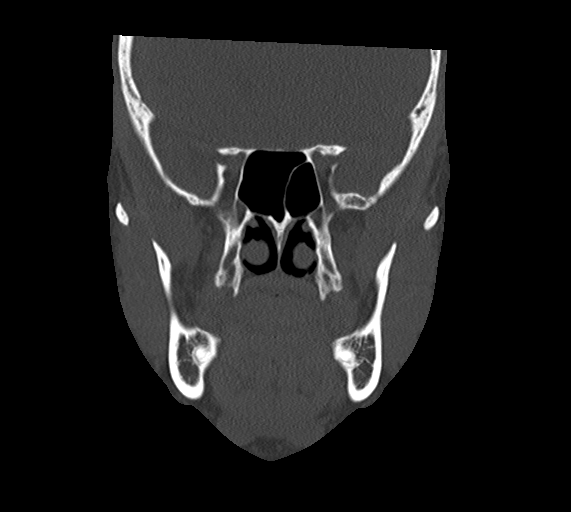
[im 61/81  bone]
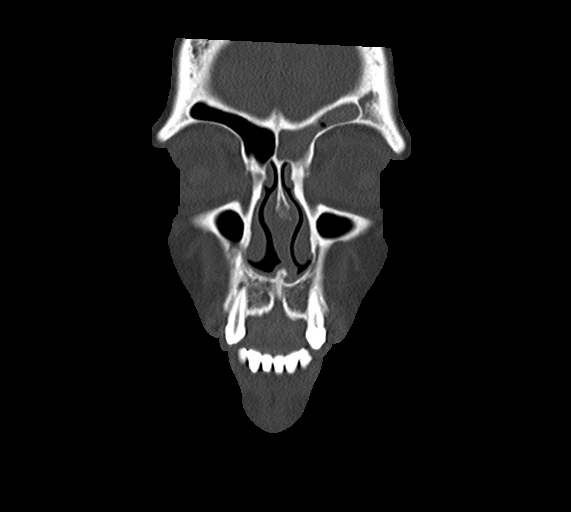

[16 of 47 positions shown; findings below may reference images not displayed]

FINDINGS: Osseous: Negative for fracture. No bone lesion identified. No acute
dental infection

Orbits: Normal orbit. No mass or edema in the orbit. Normal bony
orbit.

Sinuses: Frontal sinus extensive mucosal edema left clear. Frontal
sinus. Right remaining paranasal sinuses clear.

Mastoid sinus clear bilaterally.

Soft tissues: No soft tissue mass or adenopathy.  Normal pharynx

The left jugular vein is distended and ill-defined suggesting left
jugular vein thrombosis. There is filling defect in the sigmoid
sinus and proximal left jugular vein on MRV of the head today.

Limited intracranial: Negative
IMPRESSION: 1. Findings consistent with thrombus in the left jugular vein. This
corresponds with filling defect in the left sigmoid sinus and
jugular vein on MR venogram from today.
2. Mucosal edema left frontal sinus.  Remaining sinuses clear.

## 2020-10-28 IMAGING — MR MR MRV HEAD WO/W CM
4 of 5 series · 19 of 48 positions shown · IV contrast (6 ML GAD)
Comparison: None.

CLINICAL DATA: Headache, near syncope

EXAM:
MR VENOGRAM HEAD WITHOUT AND WITH CONTRAST
TECHNIQUE: Angiographic images of the intracranial venous structures were
obtained using MRV technique without and with intravenous contrast.
CONTRAST:  6mL GADAVIST GADOBUTROL 1 MMOL/ML IV SOLN

[Series 3: MRV · coronal · 1.5mm · 0.43mm/px · 6 of 120 slices shown]
[im 1/120]
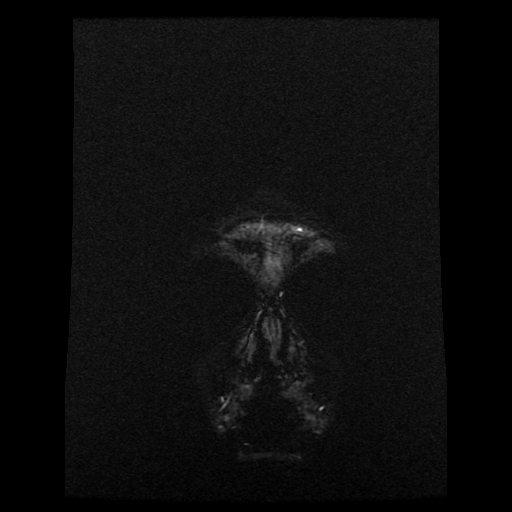
[im 24/120]
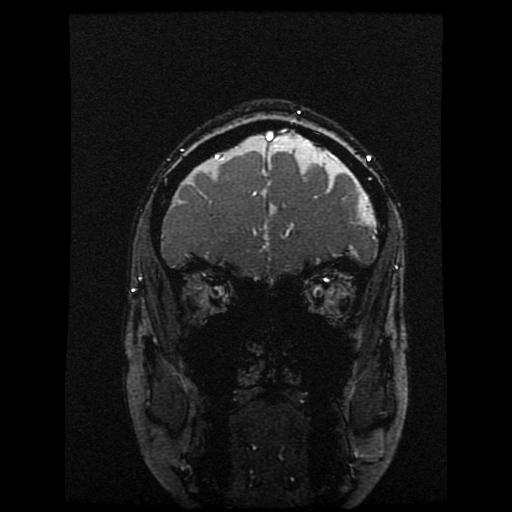
[im 48/120]
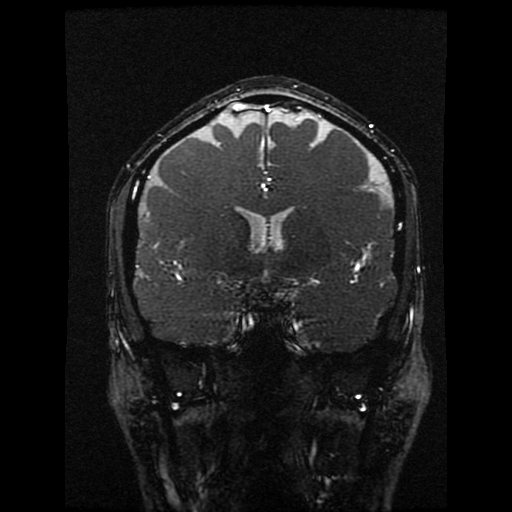
[im 72/120]
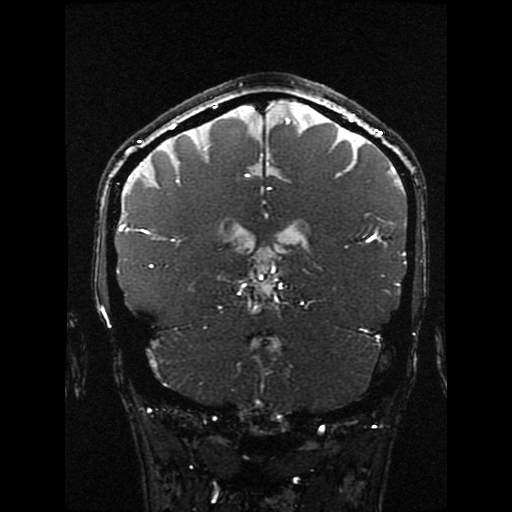
[im 96/120]
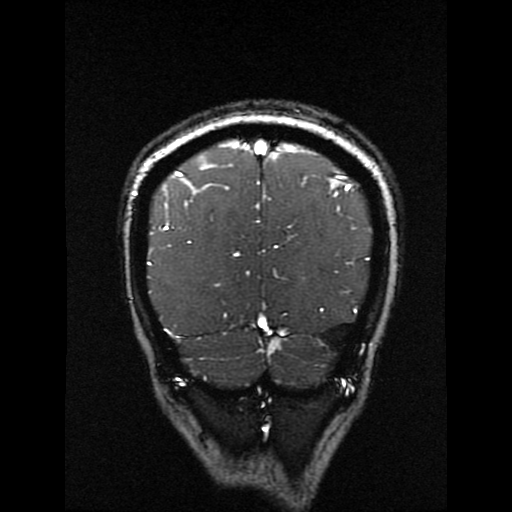
[im 120/120]
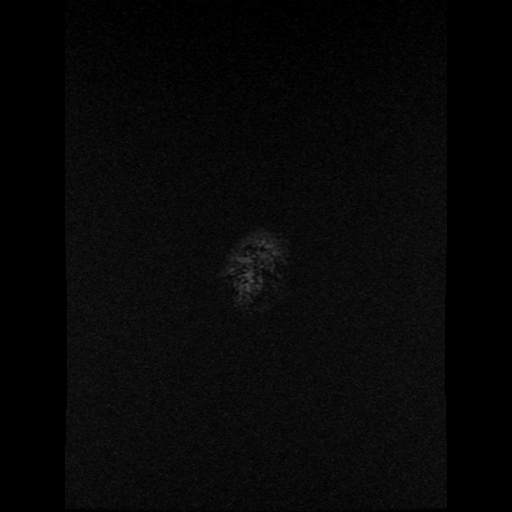

[Series 4: sag inhance (id) · sagittal · 1.8mm · 0.47mm/px · 7 of 326 slices shown]
[im 1/326]
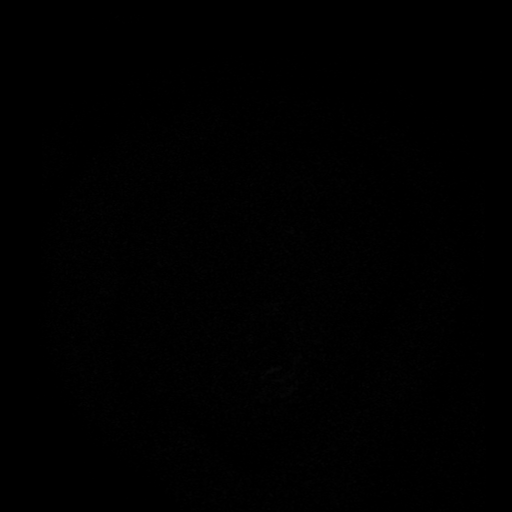
[im 47/326]
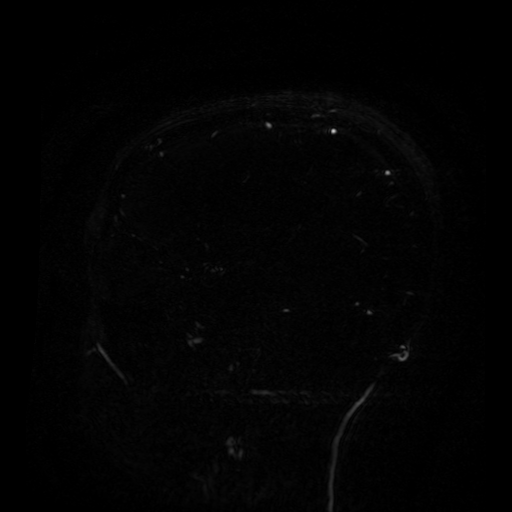
[im 93/326]
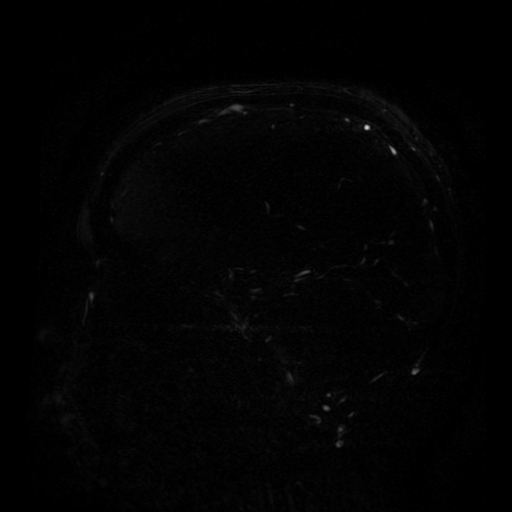
[im 140/326]
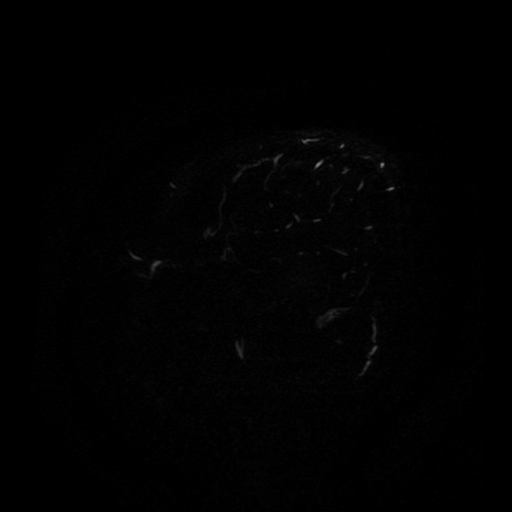
[im 163/326]
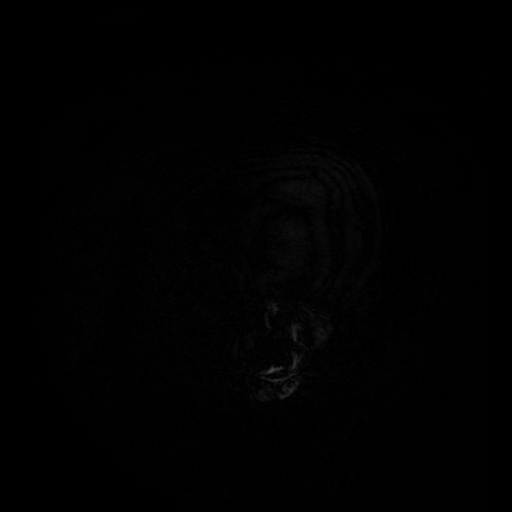
[im 186/326]
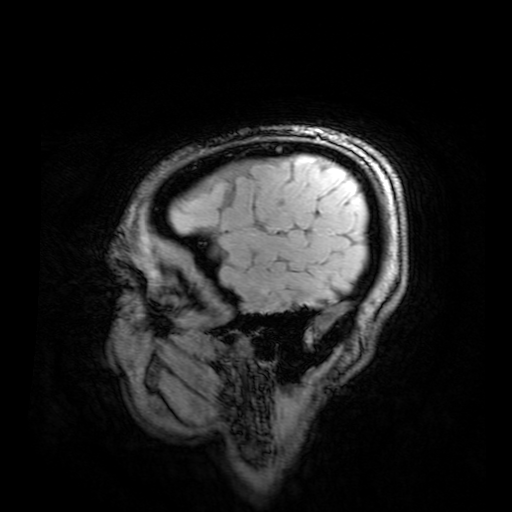
[im 279/326]
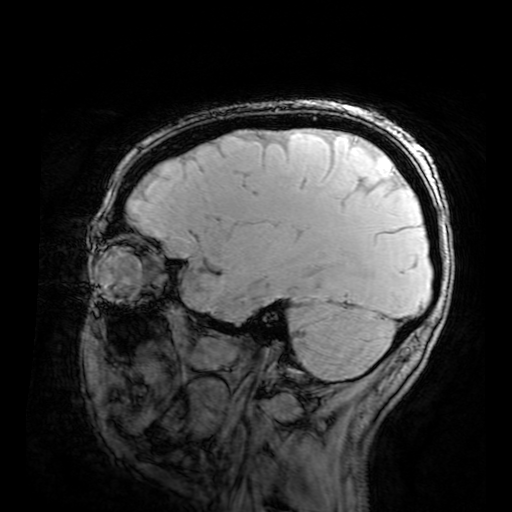

[Series 500: multiplanar reconstruction (mpr) · sagittal · 0.9mm · 0.47mm/px · 3 of 205 slices shown (1 of 2)]
[im 26/205]
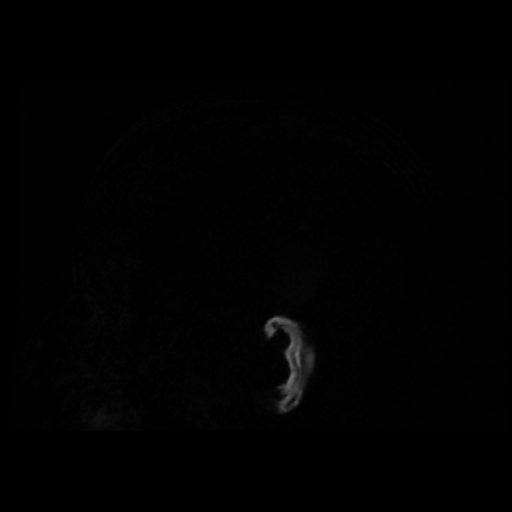
[im 103/205]
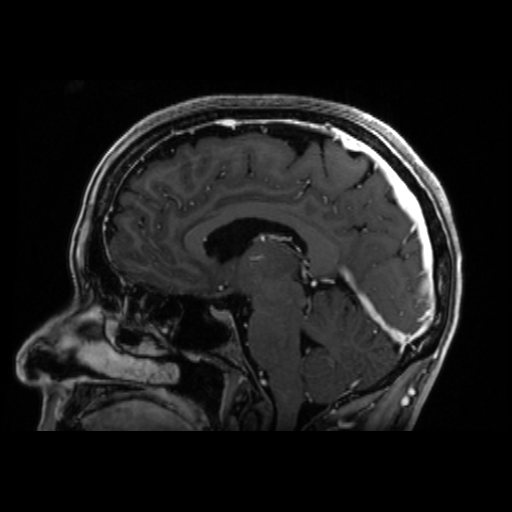
[im 179/205]
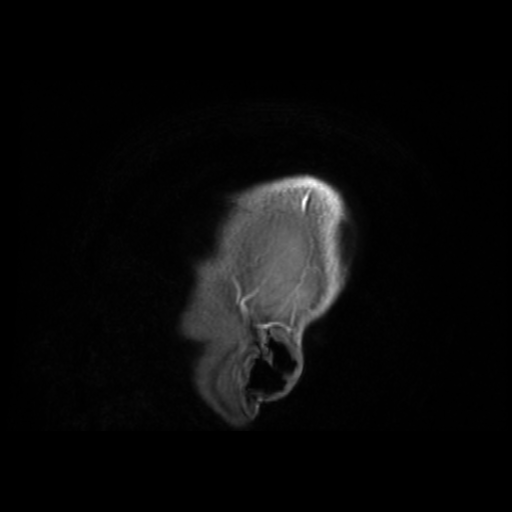

[Series 501: multiplanar reconstruction (mpr) · coronal · 0.9mm · 0.47mm/px · 3 of 254 slices shown (2 of 2)]
[im 26/254]
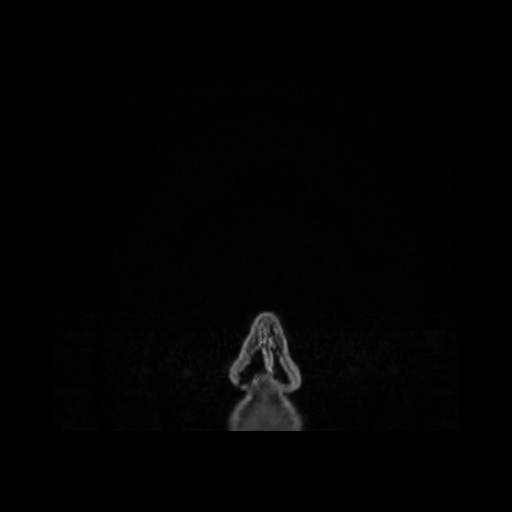
[im 127/254]
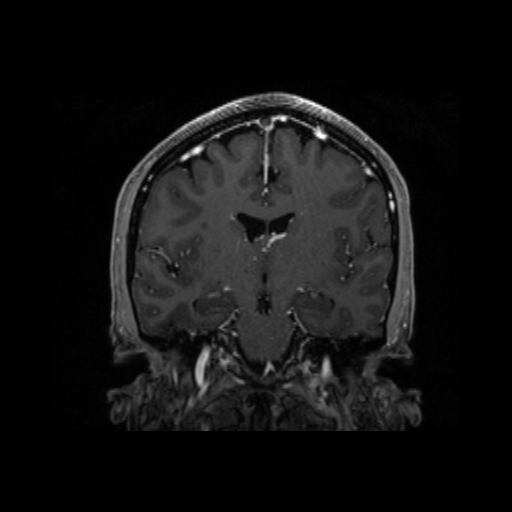
[im 228/254]
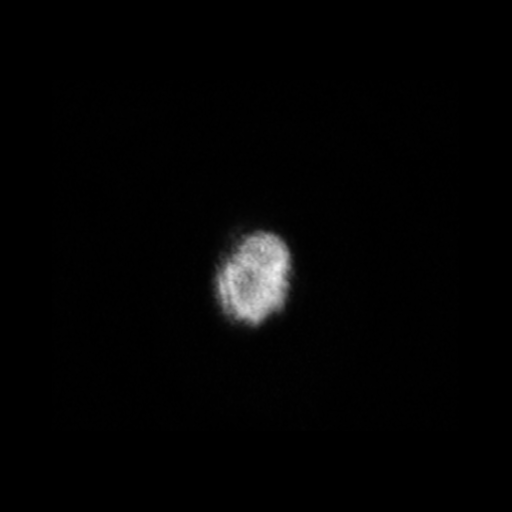

[19 of 48 positions shown; findings below may reference images not displayed]

FINDINGS: There is nonocclusive thrombus extending from the mid superior
sagittal sinus posteriorly. A sliver extends posteriorly pre
remainder of the superior sagittal sinus sinus is patent. Straight
sinus, vein of RIVERA KALEMBERG, and internal cerebral veins are patent. Right
transverse and sigmoid sinuses are patent. Dominant left transverse
sinus is patent. There is absent enhancement of the left sigmoid
sinus and visualized left internal jugular vein.

There is no abnormal intracranial enhancement.
IMPRESSION: Nonocclusive thrombus within the mid superior sagittal sinus
extending posteriorly.

Thrombus within the left sigmoid sinus and visualized left internal
jugular vein.

These results were called by telephone at the time of interpretation
on [DATE] at [DATE] to provider Dr. RIVERA KALEMBERG, Who verbally
acknowledged these results.

## 2020-10-28 MED ORDER — ACETAMINOPHEN 325 MG PO TABS
650.0000 mg | ORAL_TABLET | Freq: Four times a day (QID) | ORAL | Status: DC | PRN
  Administered 2020-10-29 – 2020-10-31 (×5): 650 mg via ORAL
  Filled 2020-10-28 (×6): qty 2

## 2020-10-28 MED ORDER — MECLIZINE HCL 25 MG PO TABS
25.0000 mg | ORAL_TABLET | Freq: Once | ORAL | Status: AC
  Administered 2020-10-28: 25 mg via ORAL
  Filled 2020-10-28: qty 1

## 2020-10-28 MED ORDER — ACETAMINOPHEN 650 MG RE SUPP: 650.0000 mg | Freq: Four times a day (QID) | RECTAL | Status: DC | PRN

## 2020-10-28 MED ORDER — DEXAMETHASONE SODIUM PHOSPHATE 10 MG/ML IJ SOLN
10.0000 mg | Freq: Once | INTRAMUSCULAR | Status: AC
  Administered 2020-10-28: 10 mg via INTRAVENOUS
  Filled 2020-10-28: qty 1

## 2020-10-28 MED ORDER — KETOROLAC TROMETHAMINE 15 MG/ML IJ SOLN
15.0000 mg | Freq: Once | INTRAMUSCULAR | Status: AC
  Administered 2020-10-28: 15 mg via INTRAVENOUS
  Filled 2020-10-28: qty 1

## 2020-10-28 MED ORDER — METOCLOPRAMIDE HCL 5 MG/ML IJ SOLN
10.0000 mg | Freq: Once | INTRAMUSCULAR | Status: AC
  Administered 2020-10-28: 10 mg via INTRAVENOUS
  Filled 2020-10-28: qty 2

## 2020-10-28 MED ORDER — ONDANSETRON HCL 4 MG/2ML IJ SOLN
4.0000 mg | Freq: Four times a day (QID) | INTRAMUSCULAR | Status: DC | PRN
  Administered 2020-10-28 – 2020-10-30 (×4): 4 mg via INTRAVENOUS
  Filled 2020-10-28 (×4): qty 2

## 2020-10-28 MED ORDER — ONDANSETRON HCL 4 MG PO TABS
4.0000 mg | ORAL_TABLET | Freq: Four times a day (QID) | ORAL | Status: DC | PRN
  Administered 2020-10-30: 4 mg via ORAL
  Filled 2020-10-28: qty 1

## 2020-10-28 MED ORDER — FENTANYL CITRATE (PF) 100 MCG/2ML IJ SOLN
25.0000 ug | Freq: Once | INTRAMUSCULAR | Status: AC
  Administered 2020-10-28: 25 ug via INTRAVENOUS
  Filled 2020-10-28: qty 2

## 2020-10-28 MED ORDER — HYDROMORPHONE HCL 1 MG/ML IJ SOLN
0.5000 mg | INTRAMUSCULAR | Status: DC | PRN
  Administered 2020-10-28: 0.5 mg via INTRAVENOUS
  Filled 2020-10-28: qty 1

## 2020-10-28 MED ORDER — DIPHENHYDRAMINE HCL 50 MG/ML IJ SOLN
25.0000 mg | Freq: Once | INTRAMUSCULAR | Status: AC
  Administered 2020-10-28: 25 mg via INTRAVENOUS
  Filled 2020-10-28: qty 1

## 2020-10-28 MED ORDER — LACTATED RINGERS IV SOLN: INTRAVENOUS | Status: AC

## 2020-10-28 MED ORDER — LORAZEPAM 2 MG/ML IJ SOLN
1.0000 mg | Freq: Once | INTRAMUSCULAR | Status: AC
  Administered 2020-10-28: 1 mg via INTRAVENOUS
  Filled 2020-10-28: qty 1

## 2020-10-28 MED ORDER — LACTATED RINGERS IV BOLUS
1000.0000 mL | Freq: Once | INTRAVENOUS | Status: AC
  Administered 2020-10-28: 1000 mL via INTRAVENOUS

## 2020-10-28 MED ORDER — SODIUM CHLORIDE 0.9 % IV SOLN
3.0000 g | Freq: Four times a day (QID) | INTRAVENOUS | Status: DC
  Administered 2020-10-28 – 2020-10-31 (×11): 3 g via INTRAVENOUS
  Filled 2020-10-28 (×2): qty 3
  Filled 2020-10-28: qty 8
  Filled 2020-10-28: qty 3
  Filled 2020-10-28 (×2): qty 8
  Filled 2020-10-28 (×5): qty 3
  Filled 2020-10-28 (×2): qty 8
  Filled 2020-10-28: qty 3

## 2020-10-28 MED ORDER — FENTANYL CITRATE (PF) 100 MCG/2ML IJ SOLN
25.0000 ug | INTRAMUSCULAR | Status: DC | PRN
Start: 2020-10-28 — End: 2020-10-31
  Administered 2020-10-29: 25 ug via INTRAVENOUS
  Administered 2020-10-29: 50 ug via INTRAVENOUS
  Administered 2020-10-29 (×2): 25 ug via INTRAVENOUS
  Administered 2020-10-29 – 2020-10-30 (×3): 50 ug via INTRAVENOUS
  Filled 2020-10-28 (×7): qty 2

## 2020-10-28 MED ORDER — GADOBUTROL 1 MMOL/ML IV SOLN
6.0000 mL | Freq: Once | INTRAVENOUS | Status: AC | PRN
  Administered 2020-10-28: 6 mL via INTRAVENOUS

## 2020-10-28 MED ORDER — HEPARIN (PORCINE) 25000 UT/250ML-% IV SOLN
950.0000 [IU]/h | INTRAVENOUS | Status: DC
  Administered 2020-10-28 – 2020-10-31 (×3): 850 [IU]/h via INTRAVENOUS
  Filled 2020-10-28 (×4): qty 250

## 2020-10-28 MED ORDER — LACTATED RINGERS IV SOLN: INTRAVENOUS | Status: DC

## 2020-10-28 MED ORDER — LORAZEPAM 2 MG/ML IJ SOLN: 0.5000 mg | Freq: Once | INTRAMUSCULAR | Status: DC | PRN

## 2020-10-28 NOTE — ED Notes (Signed)
Assumed care of patient at this time. Patient in MRI at this time. Will assess upon return.

## 2020-10-28 NOTE — ED Provider Notes (Signed)
[facility] EMERGENCY DEPARTMENT Provider Note   CSN: [id number] Arrival date & time: 10/28/20  7793     History Chief Complaint  Patient presents with  . Headache  . Near Syncope    Sydney [name] is a 54 y.o. female.  Patient is a 54 year old female with a history of 3 a chronic kidney disease who has no other significant medical history presenting today with headache, facial pain, nausea, vomiting, left ear pain and dizziness.  Patient reports for the last 3 weeks she has been under the care of her PCP and has been treated several times for a sinus infection.  She reports that she usually gets a sinus infection about once a year.  She has had several rounds of steroids and antibiotics which she reports starts making her symptoms better and then she will complete the course and they return again.  She had been having a headache and left ear pain but it was tolerable until last night she reports symptoms became significantly worse.  She had recently completed a course of antibiotics and steroids a few days ago.  She reports the pain is a throbbing sensation behind her eyes as well as a severe sharp pain in her left ear that radiates into her throat.  She attempted to go workout this morning and was doing walking with her walking group and the pain just became more intense.  She then started having nausea and vomiting as well as some dizziness which she describes as some spinning.  The headache is worse with light and loud noise.  The vomiting has become persistent and she is unable to hold anything down.  She has felt several times like she may syncopized but has not lost consciousness and reports the dizziness feels a little worse when she is moving around or has her eyes open.  She denies any significant cough or shortness of breath.  She has received all 3 Covid vaccines but has had a positive exposure recently.  She denies any fever or neck pain.  No unilateral numbness  or weakness.  She denies any visual changes.  The history is provided by the patient.  Headache Pain location:  Frontal Quality:  Dull Radiates to: Left ear. Severity currently:  8/10 Onset quality:  Gradual Duration:  3 weeks Timing:  Intermittent Progression:  Worsening Chronicity:  New Similar to prior headaches: no   Associated symptoms: congestion, dizziness, facial pain, nausea, near-syncope, photophobia, swollen glands and vomiting   Associated symptoms: no abdominal pain, no back pain, no blurred vision, no fever, no focal weakness, no neck stiffness, no numbness and no paresthesias   Near Syncope Associated symptoms include headaches. Pertinent negatives include no abdominal pain.       Past Medical History:  Diagnosis Date  . Anemia   . History of cold sores   . HPV (human papilloma virus) infection   . Post-operative nausea and vomiting     Patient Active Problem List   Diagnosis Date Noted  . Abnormal blood level of iron 09/28/2020  . Iron deficiency anemia 09/10/2019  . CKD (chronic kidney disease) stage 3, GFR 30-59 ml/min (HCC) 09/10/2019    Past Surgical History:  Procedure Laterality Date  . BLEPHAROPLASTY  2005  . CESAREAN SECTION  2002     OB History    Gravida  1   Para  1   Term      Preterm      AB  Living  1     SAB      IAB      Ectopic      Multiple      Live Births              Family History  Problem Relation Age of Onset  . Lung cancer Maternal Grandmother   . Lung cancer Maternal Grandfather   . Colon cancer Neg Hx   . Colon polyps Neg Hx   . Esophageal cancer Neg Hx   . Rectal cancer Neg Hx   . Stomach cancer Neg Hx     Social History   Tobacco Use  . Smoking status: Never Smoker  . Smokeless tobacco: Never Used  Vaping Use  . Vaping Use: Never used  Substance Use Topics  . Alcohol use: Never  . Drug use: Never    Home Medications Prior to Admission medications   Medication Sig Start  Date End Date Taking? Authorizing Provider  JUNEL 1/20 1-20 MG-MCG tablet Take 1 tablet by mouth daily. 08/11/20   [provider]  Multiple Vitamin (MULTIVITAMIN) tablet Take 1 tablet by mouth daily.    [provider]  valACYclovir (VALTREX) 500 MG tablet Take 500 mg by mouth 2 (two) times daily as needed. 05/16/19   [provider]    Allergies    Doxycycline  Review of Systems   Review of Systems  Constitutional: Negative for fever.  HENT: Positive for congestion.   Eyes: Positive for photophobia. Negative for blurred vision.  Cardiovascular: Positive for near-syncope.  Gastrointestinal: Positive for nausea and vomiting. Negative for abdominal pain.  Musculoskeletal: Negative for back pain and neck stiffness.  Neurological: Positive for dizziness and headaches. Negative for focal weakness, numbness and paresthesias.  All other systems reviewed and are negative.   Physical Exam Updated Vital Signs BP (!) 154/78 (BP Location: Right Arm)   Pulse 69   Temp 98.3 F (36.8 C) (Oral)   Resp 17   Ht 5\' 2"  (1.575 m)   Wt 59.9 kg   SpO2 100%   BMI 24.14 kg/m   Physical Exam Vitals and nursing note reviewed.  Constitutional:      General: She is not in acute distress.    Appearance: She is well-developed, normal weight and well-nourished. She is ill-appearing.     Comments: Currently retching over an emesis bag upon arrival  HENT:     Head: Normocephalic and atraumatic.     Right Ear: A middle ear effusion is present.     Left Ear: Swelling and tenderness present. No drainage. A middle ear effusion is present. No mastoid tenderness. Tympanic membrane is bulging. Tympanic membrane is not perforated or erythematous.     Nose: Mucosal edema present.     Mouth/Throat:     Mouth: Mucous membranes are dry.     Tongue: No lesions.     Palate: No mass.     Pharynx: Oropharynx is clear.  Eyes:     Extraocular Movements: Extraocular movements intact and EOM  normal.     Pupils: Pupils are equal, round, and reactive to light.  Neck:     Comments: Tender left-sided cervical adenopathy Cardiovascular:     Rate and Rhythm: Normal rate and regular rhythm.     Pulses: Intact distal pulses.     Heart sounds: Normal heart sounds. No murmur heard. No friction rub.  Pulmonary:     Effort: Pulmonary effort is normal.     Breath  sounds: Normal breath sounds. No wheezing or rales.  Abdominal:     General: Bowel sounds are normal. There is no distension.     Palpations: Abdomen is soft.     Tenderness: There is no abdominal tenderness. There is no guarding or rebound.  Musculoskeletal:        General: No tenderness. Normal range of motion.     Cervical back: Normal range of motion and neck supple.     Comments: No edema  Lymphadenopathy:     Cervical: Cervical adenopathy present.  Skin:    General: Skin is warm and dry.     Capillary Refill: Capillary refill takes less than 2 seconds.     Findings: No rash.  Neurological:     General: No focal deficit present.     Mental Status: She is alert and oriented to person, place, and time. Mental status is at baseline.     Cranial Nerves: No cranial nerve deficit.     Sensory: No sensory deficit.     Motor: No weakness.     Coordination: Coordination normal.     Gait: Gait normal.  Psychiatric:        Mood and Affect: Mood and affect and mood normal.        Behavior: Behavior normal.        Thought Content: Thought content normal.     ED Results / Procedures / Treatments   Labs (all labs ordered are listed, but only abnormal results are displayed) Labs Reviewed  BASIC METABOLIC PANEL - Abnormal; Notable for the following components:      Result Value   Potassium 3.1 (*)    CO2 21 (*)    Glucose, Bld 125 (*)    Creatinine, Ser 1.37 (*)    GFR, Estimated 46 (*)    All other components within normal limits  CBC - Abnormal; Notable for the following components:   WBC 10.9 (*)    Hemoglobin  11.8 (*)    HCT 35.7 (*)    Platelets 102 (*)    All other components within normal limits  CBG MONITORING, ED - Abnormal; Notable for the following components:   Glucose-Capillary 120 (*)    All other components within normal limits  RESP PANEL BY RT-PCR (FLU A&B, COVID) [id number]  URINALYSIS, ROUTINE W REFLEX MICROSCOPIC  I-STAT BETA HCG BLOOD, ED ([facility], [facility], [facility] ONLY)    EKG EKG Interpretation  Date/Time:  Thursday October 28 2020 08:12:36 EST Ventricular Rate:  81 PR Interval:  124 QRS Duration: 84 QT Interval:  394 QTC Calculation: 457 R Axis:   97 Text Interpretation: Normal sinus rhythm Rightward axis Artifact No significant change since last tracing Confirmed by Blanchie Dessert [phone]) on 10/28/2020 10:07:28 AM   Radiology CT HEAD WO CONTRAST  Result Date: 10/28/2020 CLINICAL DATA:  Headache, nausea. EXAM: CT HEAD WITHOUT CONTRAST TECHNIQUE: Contiguous axial images were obtained from the base of the skull through the vertex without intravenous contrast. COMPARISON:  None. FINDINGS: Brain: No evidence of acute infarction, hemorrhage, hydrocephalus, extra-axial collection or mass lesion/mass effect. Vascular: No hyperdense vessel or unexpected calcification. Skull: Normal. Negative for fracture or focal lesion. Sinuses/Orbits: No acute finding. Other: None. IMPRESSION: Normal head CT. Electronically Signed   By: Marijo Conception M.D.   On: 10/28/2020 09:28    Procedures Procedures (including critical care time)  Medications Ordered in ED Medications  metoCLOPramide (REGLAN) injection 10 mg (has no administration in time range)  lactated ringers  bolus 1,000 mL (has no administration in time range)  diphenhydrAMINE (BENADRYL) injection 25 mg (has no administration in time range)  dexamethasone (DECADRON) injection 10 mg (has no administration in time range)    ED Course  I have reviewed the triage vital signs and the nursing notes.  Pertinent labs & imaging results that  were available during my care of the patient were reviewed by me and considered in my medical decision making (see chart for details).    MDM Rules/Calculators/A&P                          Patient presenting today with complaint of headache which is facial pain, left ear pain in the setting of recent sinus symptoms for the last 3 weeks.  Patient is also now currently having nausea vomiting with mostly dry heaving at this point.  Patient is photosensitive but also complaining of some dizziness which may be vertiginous.  She does have bilateral tympanic membrane effusions but no significant signs for infection.  She has full range of motion of her neck and normal mental status with low suspicion for encephalopathy or meningitis.  She is afebrile at this time and denies any recent fever.  She is not displaying any focal neurologic abnormality concerning for stroke.  The headache has been present for 3 weeks but became much worse in the last few hours.  Patient had a head CT within 6 hours of the headache becoming worse and the head CT is within normal limits.  Patient's creatinine has bumped some today from her baseline of 1.1-1.37, mild hypokalemia of 3.1 which is most likely from vomiting.  Mild leukocytosis of 10,000 but stable hemoglobin of 11.  Also no acute findings were present in the sinuses or orbits on CT and EKG without acute findings.  Suspect patient's headache may be from ongoing sinus congestion, some dehydration and possibly even vertigo causing the vomiting from middle ear effusion.  Will check Covid.  Patient needs symptom control and will be given IV fluids, headache cocktail including Reglan, Decadron and Benadryl.  Will reassess after patient receives medication and fluid.  12:20 PM On reevaluation patient reports nausea and vomiting has improved but she is still having a headache and she still has significant dizziness when she tries to open her eyes which then causes more nausea.  We  will try Ativan for symptoms and some pain medication.  2:02 PM On reevaluation patient reports the dizziness is better she still having significant point tenderness in the left jaw and lymph nodes.  She is still having a 5 out of 10 pressure behind her eyes.  She does still describe some mild blurry vision which she states is not her baseline.  We will do MRV to rule out cavernous sinus venous thrombosis.   Final Clinical Impression(s) / ED Diagnoses Final diagnoses:  None    Rx / DC Orders ED Discharge Orders    None       Gwyneth Sprout, MD 10/28/20 2100

## 2020-10-28 NOTE — Progress Notes (Signed)
The patient is being started on IV heparin (goal heparin level of 0.3-0.5 U/mL) with Pharmacy to dose. Discussed with Barbara Cower from Pharmacy and consult has been placed. Appreciate Pharmacy assistance.   Electronically signed: Dr. Caryl Pina

## 2020-10-28 NOTE — H&P (Addendum)
History and Physical    Sydney [name] LGX:[id number] DOB: 1966/05/09 DOA: 10/28/2020  Referring MD/NP/PA: EDP PCP:  Patient coming from: Home  Chief Complaint: Headache, almost passed out  HPI: Sydney [name] is a 54 y.o. female with medical history significant for mild kidney disease, rhabdomyolysis few years ago but no other significant medical history presented to the ED today with multiple complaints. -Over the past 3 weeks she has been treated by her PCP with multiple courses of antibiotics (amoxicillin followed by cefdinir )for sinus infections which progressed to middle ear infections, she also had a course of prednisone.  Her ears and sinuses started improving however developed pain and swelling in the left side of her neck associated with headaches over the last few days.  Describes the headache as throbbing, right behind her years and close to her left ear as well.  -Today when she was walking started developing nausea and some dizziness, and a few episodes of vomiting and felt like she was about to pass out.  She denies any loss of consciousness. -Denies any fevers or chills, denies any symptoms of Covid, she has received 2 Pfizer shots in January and February followed by booster in November. ED Course: Vital signs were unremarkable, labs were notable for potassium 3.1, creatinine of 1.3, white count of 10.9, Covid PCR and urine hCG were negative.  She had an MRV done which was notable for Nonocclusive thrombus within the mid superior sagittal sinus extending posteriorly. Thrombus within the left sigmoid sinus and visualized left internal jugular vein.  Review of Systems: As per HPI otherwise 14 point review of systems negative.   Past Medical History:  Diagnosis Date  . Anemia   . History of cold sores   . HPV (human papilloma virus) infection   . Post-operative nausea and vomiting     Past Surgical History:  Procedure Laterality Date  . BLEPHAROPLASTY  2005  .  CESAREAN SECTION  2002     reports that she has never smoked. She has never used smokeless tobacco. She reports that she does not drink alcohol and does not use drugs.  Allergies  Allergen Reactions  . Doxycycline Nausea And Vomiting    Family History  Problem Relation Age of Onset  . Lung cancer Maternal Grandmother   . Lung cancer Maternal Grandfather   . Colon cancer Neg Hx   . Colon polyps Neg Hx   . Esophageal cancer Neg Hx   . Rectal cancer Neg Hx   . Stomach cancer Neg Hx      Prior to Admission medications   Medication Sig Start Date End Date Taking? Authorizing Provider  JUNEL 1/20 1-20 MG-MCG tablet Take 1 tablet by mouth daily. 08/11/20   [provider]  Multiple Vitamin (MULTIVITAMIN) tablet Take 1 tablet by mouth daily.    [provider]  predniSONE (DELTASONE) 10 MG tablet Take 10 mg by mouth daily. 10/21/20   [provider]  valACYclovir (VALTREX) 500 MG tablet Take 500 mg by mouth 2 (two) times daily as needed. 05/16/19   [provider]    Physical Exam: Vitals:   10/28/20 1638 10/28/20 1639 10/28/20 1715 10/28/20 1800  BP: 128/86  139/77 (!) 156/93  Pulse: (!) 56 (!) 56 (!) 52 73  Resp: 18  12 15   Temp: 98.4 F (36.9 C)     TempSrc: Oral     SpO2: 99% 99% 99% 100%  Weight:      Height:  Constitutional: Pleasant female sitting up in bed, anxious appearing, AAOx3 Vitals:   10/28/20 1638 10/28/20 1639 10/28/20 1715 10/28/20 1800  BP: 128/86  139/77 (!) 156/93  Pulse: (!) 56 (!) 56 (!) 52 73  Resp: 18  12 15   Temp: 98.4 F (36.9 C)     TempSrc: Oral     SpO2: 99% 99% 99% 100%  Weight:      Height:       HEENT: Pupils equal and reactive, mild swelling and tenderness in the left upper neck with lymphadenopathy CVS: S1-S2, regular rate rhythm Lungs: Clear bilaterally Abdomen: Soft, nontender, bowel sounds present Extremities: No edema Skin: No rashes on exposed skin Neuro: Cranial nerves intact,  motor strength is 5 x 5 in both extremities, sensations intact Psychiatric: Notably anxious but otherwise appropriate mood and affect  Labs on Admission: I have personally reviewed following labs and imaging studies  CBC: Recent Labs  Lab 10/28/20 0818  WBC 10.9*  HGB 11.8*  HCT 35.7*  MCV 90.6  PLT [id number]*   Basic Metabolic Panel: Recent Labs  Lab 10/28/20 0818  NA 136  K 3.1*  CL 104  CO2 21*  GLUCOSE 125*  BUN 14  CREATININE 1.37*  CALCIUM 8.9   GFR: Estimated Creatinine Clearance: 38 mL/min (A) (by C-G formula based on SCr of 1.37 mg/dL (H)). Liver Function Tests: No results for input(s): AST, ALT, ALKPHOS, BILITOT, PROT, ALBUMIN in the last 168 hours. No results for input(s): LIPASE, AMYLASE in the last 168 hours. No results for input(s): AMMONIA in the last 168 hours. Coagulation Profile: No results for input(s): INR, PROTIME in the last 168 hours. Cardiac Enzymes: No results for input(s): CKTOTAL, CKMB, CKMBINDEX, TROPONINI in the last 168 hours. BNP (last 3 results) No results for input(s): PROBNP in the last 8760 hours. HbA1C: No results for input(s): HGBA1C in the last 72 hours. CBG: Recent Labs  Lab 10/28/20 0823  GLUCAP 120*   Lipid Profile: No results for input(s): CHOL, HDL, LDLCALC, TRIG, CHOLHDL, LDLDIRECT in the last 72 hours. Thyroid Function Tests: No results for input(s): TSH, T4TOTAL, FREET4, T3FREE, THYROIDAB in the last 72 hours. Anemia Panel: No results for input(s): VITAMINB12, FOLATE, FERRITIN, TIBC, IRON, RETICCTPCT in the last 72 hours. Urine analysis:    Component Value Date/Time   COLORURINE YELLOW 10/28/2020 1420   APPEARANCEUR CLEAR 10/28/2020 1420   LABSPEC 1.013 10/28/2020 1420   PHURINE 5.0 10/28/2020 1420   GLUCOSEU NEGATIVE 10/28/2020 1420   HGBUR NEGATIVE 10/28/2020 1420   BILIRUBINUR NEGATIVE 10/28/2020 1420   KETONESUR 5 (A) 10/28/2020 1420   PROTEINUR NEGATIVE 10/28/2020 1420   NITRITE NEGATIVE 10/28/2020 1420    LEUKOCYTESUR NEGATIVE 10/28/2020 1420   Sepsis Labs: @LABRCNTIP (procalcitonin:4,lacticidven:4) ) Recent Results (from the past 240 hour(s))  Resp Panel by RT-PCR (Flu A&B, Covid) Nasopharyngeal Swab     Status: None   Collection Time: 10/28/20 10:46 AM   Specimen: Nasopharyngeal Swab; Nasopharyngeal(NP) swabs in vial transport medium  Result Value Ref Range Status   SARS Coronavirus 2 by RT PCR NEGATIVE NEGATIVE Final    Comment: (NOTE) SARS-CoV-2 target nucleic acids are NOT DETECTED.  The SARS-CoV-2 RNA is generally detectable in upper respiratory specimens during the acute phase of infection. The lowest concentration of SARS-CoV-2 viral copies this assay can detect is 138 copies/mL. A negative result does not preclude SARS-Cov-2 infection and should not be used as the sole basis for treatment or other patient management decisions. A negative result may occur with  improper specimen collection/handling, submission of specimen other than nasopharyngeal swab, presence of viral mutation(s) within the areas targeted by this assay, and inadequate number of viral copies(<138 copies/mL). A negative result must be combined with clinical observations, patient history, and epidemiological information. The expected result is Negative.  Fact Sheet for Patients:  EntrepreneurPulse.com.au  Fact Sheet for Healthcare Providers:  IncredibleEmployment.be  This test is no t yet approved or cleared by the [place] FDA and  has been authorized for detection and/or diagnosis of SARS-CoV-2 by FDA under an Emergency Use Authorization (EUA). This EUA will remain  in effect (meaning this test can be used) for the duration of the COVID-19 declaration under Section 564(b)(1) of the Act, 21 U.S.C.section 360bbb-3(b)(1), unless the authorization is terminated  or revoked sooner.       Influenza A by PCR NEGATIVE NEGATIVE Final   Influenza B by PCR NEGATIVE  NEGATIVE Final    Comment: (NOTE) The Xpert Xpress SARS-CoV-2/FLU/RSV plus assay is intended as an aid in the diagnosis of influenza from Nasopharyngeal swab specimens and should not be used as a sole basis for treatment. Nasal washings and aspirates are unacceptable for Xpert Xpress SARS-CoV-2/FLU/RSV testing.  Fact Sheet for Patients: EntrepreneurPulse.com.au  Fact Sheet for Healthcare Providers: IncredibleEmployment.be  This test is not yet approved or cleared by the [place] FDA and has been authorized for detection and/or diagnosis of SARS-CoV-2 by FDA under an Emergency Use Authorization (EUA). This EUA will remain in effect (meaning this test can be used) for the duration of the COVID-19 declaration under Section 564(b)(1) of the Act, 21 U.S.C. section 360bbb-3(b)(1), unless the authorization is terminated or revoked.  Performed at [facility], [place] [address], [place],  [address]      Radiological Exams on Admission: CT HEAD WO CONTRAST  Result Date: 10/28/2020 CLINICAL DATA:  Headache, nausea. EXAM: CT HEAD WITHOUT CONTRAST TECHNIQUE: Contiguous axial images were obtained from the base of the skull through the vertex without intravenous contrast. COMPARISON:  None. FINDINGS: Brain: No evidence of acute infarction, hemorrhage, hydrocephalus, extra-axial collection or mass lesion/mass effect. Vascular: No hyperdense vessel or unexpected calcification. Skull: Normal. Negative for fracture or focal lesion. Sinuses/Orbits: No acute finding. Other: None. IMPRESSION: Normal head CT. Electronically Signed   By: Marijo Conception M.D.   On: 10/28/2020 09:28   MR MRV HEAD W WO CONTRAST  Result Date: 10/28/2020 CLINICAL DATA:  Headache, near syncope EXAM: MR VENOGRAM HEAD WITHOUT AND WITH CONTRAST TECHNIQUE: Angiographic images of the intracranial venous structures were obtained using MRV technique without and with intravenous  contrast. CONTRAST:  42mL GADAVIST GADOBUTROL 1 MMOL/ML IV SOLN COMPARISON:  None. FINDINGS: There is nonocclusive thrombus extending from the mid superior sagittal sinus posteriorly. A sliver extends posteriorly pre remainder of the superior sagittal sinus sinus is patent. Straight sinus, vein of Galen, and internal cerebral veins are patent. Right transverse and sigmoid sinuses are patent. Dominant left transverse sinus is patent. There is absent enhancement of the left sigmoid sinus and visualized left internal jugular vein. There is no abnormal intracranial enhancement. 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 Thrombus within the left sigmoid sinus and visualized left internal jugular vein. These results were called by telephone at the time of interpretation on 10/28/2020 at 5:00 pm to provider Dr. Almyra Free, Who verbally acknowledged these results. Electronically Signed   By: Macy Mis M.D.   On: 10/28/2020 17:03    Assessment/Plan Principal Problem:  Cerebral venous sinus thrombosis -Etiologies could be potentially infectious, recent long course of ear infections/ ? Chronic otitis media potentialy affecting the dural sinus, will check CT head and neck, add Empiric Unasyn pending further imaging -Hormonal oral contraceptive pills (Junel 1/20 1-20) is an important risk factor, she has been on this since August -Neurology consulted -Will also order hypercoagulable panel -Per Neuro- start IV Heparin -will admit to progressive unit for first 24-48hours   AKi on CKD (chronic kidney disease) stage 2  -Prior history of renal insufficiency in the setting of rhabdomyolysis, baseline creatinine was 1.1 in November -Creatinine is 1.3 today, continue gentle hydration overnight  DVT prophylaxis: IV heparin Code Status:FUll Code Family Communication:  Son and friend at bedside Disposition Plan: home pending improvement/stabilization Consults  called: Neurology Admission status: Inpatient  Domenic Polite MD [facility]   10/28/2020, 6:20 PM

## 2020-10-28 NOTE — ED Notes (Addendum)
Patient c/o nausea and headache despite prn meds given. Patient reports that she received reglan and fentanyl earlier which helped her symptoms. MRV cannot be done until 2300. Admitting team made aware of the above.

## 2020-10-28 NOTE — Consult Note (Addendum)
Neurology Consultation  Reason for Consult: Headache Referring Physician: Dr. Gwyneth Sprout, EDP  CC: Headache  History is obtained from: Patient, chart  HPI: Sydney [name] is a 54 y.o. female past medical history of stage III CKD, anemia, on hormonal therapy for hot flashes, presenting to the emergency room for evaluation of a pressure-like headache that has been going on now for about 24 hours.  She was in her usual to felt but 24 hours ago when she had this headache that would not go away.  She has been dealing with a sinus and ear infection for the past few weeks and thought that the headache is likely related to it but this headache continued to be worse over the course of the last day which brought her to the emergency room. She has been treated with antibiotics and steroids for her presumed sinus and ear infections in the past few weeks to months. In the emergency room, the ED provider obtained a CT head and a CT maxillofacial-that showed thrombus in the left jugular vein.  The MR venogram revealed left sigmoid sinus and jugular vein nonocclusive thrombus. Neurological consultation was obtained for further management  She has no family history of clotting disorders.  No personal history of clotting disorders.  No history of miscarriages.  Has 1 child who is 66 years of age.  She had some trouble with passing clots and had some scares of losing the baby when she was pregnant with her son, who was eventually delivered via emergent C-section due to breech presentation.  LKW: Greater than 24 hours ago tpa given?: no, not a stroke, outside the window, venous sinus thrombosis Premorbid modified Rankin scale (mRS): 0   ROS: Obtained and negative except as noted in HPI  Past Medical History:  Diagnosis Date  . Anemia   . History of cold sores   . HPV (human papilloma virus) infection   . Post-operative nausea and vomiting     Family History  Problem Relation Age of Onset  .  Lung cancer Maternal Grandmother   . Lung cancer Maternal Grandfather   . Colon cancer Neg Hx   . Colon polyps Neg Hx   . Esophageal cancer Neg Hx   . Rectal cancer Neg Hx   . Stomach cancer Neg Hx    Social History:   reports that she has never smoked. She has never used smokeless tobacco. She reports that she does not drink alcohol and does not use drugs.  Medications  Current Facility-Administered Medications:  .  acetaminophen (TYLENOL) tablet 650 mg, 650 mg, Oral, Q6H PRN **OR** acetaminophen (TYLENOL) suppository 650 mg, 650 mg, Rectal, Q6H PRN, Zannie Cove, MD .  Ampicillin-Sulbactam (UNASYN) 3 g in sodium chloride 0.9 % 100 mL IVPB, 3 g, Intravenous, Q6H, Joaquim Lai, RPH .  heparin ADULT infusion 100 units/mL (25000 units/252mL), 850 Units/hr, Intravenous, Continuous, Joaquim Lai, RPH .  HYDROmorphone (DILAUDID) injection 0.5 mg, 0.5 mg, Intravenous, Q3H PRN, Zannie Cove, MD, 0.5 mg at 10/28/20 1914 .  lactated ringers infusion, , Intravenous, Continuous, Zannie Cove, MD .  LORazepam (ATIVAN) injection 0.5 mg, 0.5 mg, Intravenous, Once PRN, Zannie Cove, MD .  ondansetron [facility]) tablet 4 mg, 4 mg, Oral, Q6H PRN **OR** ondansetron (ZOFRAN) injection 4 mg, 4 mg, Intravenous, Q6H PRN, Zannie Cove, MD, 4 mg at 10/28/20 1914  Current Outpatient Medications:  .  JUNEL 1/20 1-20 MG-MCG tablet, Take 1 tablet by mouth daily., Disp: , Rfl:  .  Multiple Vitamin (  MULTIVITAMIN) tablet, Take 1 tablet by mouth daily., Disp: , Rfl:  .  predniSONE (DELTASONE) 10 MG tablet, Take 10 mg by mouth daily., Disp: , Rfl:  .  valACYclovir (VALTREX) 500 MG tablet, Take 500 mg by mouth 2 (two) times daily as needed., Disp: , Rfl:   Exam: Current vital signs: BP (!) 156/93   Pulse 73   Temp 98.4 F (36.9 C) (Oral)   Resp 15   Ht 5\' 2"  (1.575 m)   Wt 59.9 kg   SpO2 100%   BMI 24.14 kg/m  Vital signs in last 24 hours: Temp:  [98.2 F (36.8 C)-98.4 F (36.9 C)] 98.4 F  (36.9 C) (12/30 1638) Pulse Rate:  [40-74] 73 (12/30 1800) Resp:  [12-22] 15 (12/30 1800) BP: (125-163)/(63-93) 156/93 (12/30 1800) SpO2:  [97 %-100 %] 100 % (12/30 1800) Weight:  [59.9 kg] 59.9 kg (12/30 0816) GENERAL: Awake, alert in NAD HEENT: - Normocephalic and atraumatic, dry mm, no LN++, no Thyromegally, but reports some left-sided neck pain-tenderness on palpation. LUNGS - Clear to auscultation bilaterally with no wheezes CV - S1S2 RRR, no m/r/g, equal pulses bilaterally. ABDOMEN - Soft, nontender, nondistended with normoactive BS Ext: warm, well perfused, intact peripheral pulses, no edema  NEURO:  Mental Status: AA&Ox3 Language: speech is not dysarhtric.  Naming, repetition, fluency, and comprehension intact. Cranial Nerves: PERRL. EOMI, visual fields full, no facial asymmetry, facial sensation intact, hearing intact, tongue/uvula/soft palate midline, normal sternocleidomastoid and trapezius muscle strength. No evidence of tongue atrophy or fibrillations Motor: 5/5 without drift in all fours Tone: is normal and bulk is normal Sensation- Intact to light touch bilaterally Coordination: FTN intact bilaterally Gait- deferred  NIHSS-0   Labs I have reviewed labs in epic and the results pertinent to this consultation are:  CBC    Component Value Date/Time   WBC 10.9 (H) 10/28/2020 0818   RBC 3.94 10/28/2020 0818   HGB 11.8 (L) 10/28/2020 0818   HCT 35.7 (L) 10/28/2020 0818   PLT 102 (L) 10/28/2020 0818   MCV 90.6 10/28/2020 0818   MCH 29.9 10/28/2020 0818   MCHC 33.1 10/28/2020 0818   RDW 13.9 10/28/2020 0818   LYMPHSABS 1,367 08/27/2020 1433   MONOABS 0.7 05/07/2019 1635   EOSABS 21 08/27/2020 1433   BASOSABS 42 08/27/2020 1433    CMP     Component Value Date/Time   NA 136 10/28/2020 0818   K 3.1 (L) 10/28/2020 0818   CL 104 10/28/2020 0818   CO2 21 (L) 10/28/2020 0818   GLUCOSE 125 (H) 10/28/2020 0818   BUN 14 10/28/2020 0818   CREATININE 1.37 (H)  10/28/2020 0818   CREATININE 1.18 (H) 09/28/2020 1614   CALCIUM 8.9 10/28/2020 0818   PROT 7.9 09/28/2020 1614   PROT 7.9 09/28/2020 1614   ALBUMIN 4.3 08/20/2019 1536   AST 28 09/28/2020 1614   ALT 14 09/28/2020 1614   ALKPHOS 55 08/20/2019 1536   BILITOT 0.3 09/28/2020 1614   GFRNONAA 46 (L) 10/28/2020 0818   GFRAA >60 05/07/2019 1635    Lipid Panel     Component Value Date/Time   CHOL 239 (H) 08/27/2020 1433   TRIG 73 08/27/2020 1433   HDL 79 08/27/2020 1433   CHOLHDL 3.0 08/27/2020 1433   VLDL 11.2 08/20/2019 1536   LDLCALC 143 (H) 08/27/2020 1433     Imaging I have reviewed the images obtained:  CT-scan of the brain-normal Maxillofacial CT headWith findings consistent with thrombus in the left jugular  vein.  Mucosal edema of the left frontal sinus.  Remaining sinuses clear.  Magnetic resonance venogram of the head-nonocclusive thrombus within the mid superior sagittal sinus extending posteriorly, thrombus within the left sigmoid sinus and visualized left internal jugular vein.  Assessment: 54 year old with 24 hours of headaches found to have nonocclusive thrombus within the mid superior sagittal sinus extending posteriorly, with thrombus within the left sigmoid sinus and visualized left internal jugular vein. No family history of clotting disorders.  No personal history of clotting disorders. On hormonal treatment for hot flashes. Presumably dural venous sinus thrombosis in the setting of hormonal treatment.  Recommendations: Hypercoagulable labs prior to heparinization Start heparin IV.  Appreciate pharmacy consultation. MRI brain to evaluate for any underlying venous infarcts. Frequent neurochecks Telemetry Stat CT head if any change in neurological status is observed for the concern of stroke/ICH. MRA head and neck w/o (deranged kidney function) to r/o dissection (neck tenderness)  Discussed my plan in detail with the patient, and her son at bedside. Also  discussed my plan with the ED provider.  Stroke team will follow with you.  -- Amie Portland, MD [facility] Pager: [phone] If 7pm to 7am, please call on call as listed on AMION.

## 2020-10-28 NOTE — Progress Notes (Addendum)
Antibiotic/ANTICOAGULATION CONSULT NOTE  Pharmacy Consult for Heparin Indication: Venous sinus thrombus   Allergies  Allergen Reactions  . Doxycycline Nausea And Vomiting    Patient Measurements: Height: 5\' 2"  (157.5 cm) Weight: 59.9 kg (132 lb) IBW/kg (Calculated) : 50.1 Heparin Dosing Weight: 59.9kg  Vital Signs: Temp: 98.4 F (36.9 C) (12/30 1638) Temp Source: Oral (12/30 1638) BP: 156/93 (12/30 1800) Pulse Rate: 73 (12/30 1800)  Labs: Recent Labs    10/28/20 0818  HGB 11.8*  HCT 35.7*  PLT 102*  CREATININE 1.37*    Estimated Creatinine Clearance: 38 mL/min (A) (by C-G formula based on SCr of 1.37 mg/dL (H)).   Medical History: Past Medical History:  Diagnosis Date  . Anemia   . History of cold sores   . HPV (human papilloma virus) infection   . Post-operative nausea and vomiting     Medications:  Scheduled:    Assessment: Patient is a 66 yof that is being admitted with a central venus thrombus. Pharmacy has been asked to dose heparin with no bolus at this time.   Goal of Therapy:  Heparin level 0.3-0.5 units/ml Monitor platelets by anticoagulation protocol: Yes   Plan:  - Unasyn 3g IV q6h  - No heparin bolus  - Heparin drip @ 850units/hr - Heparin level in ~ 8 hours  - Monitor patient for s/s of bleeding and CBC while on heparin   44 PharmD. BCPS  10/28/2020,6:48 PM

## 2020-10-28 NOTE — ED Notes (Signed)
Pt ambulatory to restroom with standby assist.  Pt c/o nausea and mild dizziness while ambulating  Pt unsteady on feet and shuffles feet, states she is nervous to get sick again

## 2020-10-28 NOTE — ED Triage Notes (Addendum)
Pt reports she has been treated for a sinus infection over the past 3 weeks, multiple rounds of steroids and abx without resolve. States that she is now having severe, frontal headache, had near syncopal episode this morning. Took tylenol without relief. Pt tearful and tremulous in triage. A/ox4, endorses nausea and vomiting. No blood thinners or recent head trauma, no hx of HTN, however, hypertensive in the 160s in triage. A/ox4.

## 2020-10-28 NOTE — ED Notes (Signed)
Patient's HR on monitor noted to decreased to 38 bpm sinus brady. Patient denies symptoms at this time. Dr. Antionette Char made aware.

## 2020-10-28 NOTE — ED Notes (Signed)
Pt transported to MRI 

## 2020-10-28 NOTE — ED Provider Notes (Signed)
Signed out to me pending MRI results.  MRI shows sinus rhombus.  Case discussed with radiology.  Case discussed with neurology.  Hospitalist consulted for admission.  Patient continues have no focal deficit is ambulatory and symptoms appear improving with medication provided during ER stay.  Heparin to be initiated by neurology team.   Cheryll Cockayne, MD 10/28/20 [phone]

## 2020-10-28 NOTE — ED Notes (Signed)
CT contacted about patient needing to come over for scan as soon as they are able.

## 2020-10-29 ENCOUNTER — Encounter (HOSPITAL_COMMUNITY): Payer: Self-pay | Admitting: Internal Medicine

## 2020-10-29 ENCOUNTER — Inpatient Hospital Stay (HOSPITAL_COMMUNITY): Payer: 59

## 2020-10-29 DIAGNOSIS — D649 Anemia, unspecified: Secondary | ICD-10-CM

## 2020-10-29 DIAGNOSIS — R55 Syncope and collapse: Secondary | ICD-10-CM

## 2020-10-29 DIAGNOSIS — D696 Thrombocytopenia, unspecified: Secondary | ICD-10-CM | POA: Diagnosis not present

## 2020-10-29 DIAGNOSIS — N1831 Chronic kidney disease, stage 3a: Secondary | ICD-10-CM | POA: Diagnosis not present

## 2020-10-29 DIAGNOSIS — G08 Intracranial and intraspinal phlebitis and thrombophlebitis: Secondary | ICD-10-CM | POA: Diagnosis not present

## 2020-10-29 LAB — COMPREHENSIVE METABOLIC PANEL
ALT: 12 U/L (ref 0–44)
AST: 19 U/L (ref 15–41)
Albumin: 2.8 g/dL — ABNORMAL LOW (ref 3.5–5.0)
Alkaline Phosphatase: 33 U/L — ABNORMAL LOW (ref 38–126)
Anion gap: 10 (ref 5–15)
BUN: 15 mg/dL (ref 6–20)
CO2: 23 mmol/L (ref 22–32)
Calcium: 8.5 mg/dL — ABNORMAL LOW (ref 8.9–10.3)
Chloride: 104 mmol/L (ref 98–111)
Creatinine, Ser: 1.24 mg/dL — ABNORMAL HIGH (ref 0.44–1.00)
GFR, Estimated: 52 mL/min — ABNORMAL LOW (ref >60)
Glucose, Bld: 99 mg/dL (ref 70–99)
Potassium: 4.1 mmol/L (ref 3.5–5.1)
Sodium: 137 mmol/L (ref 135–145)
Total Bilirubin: 0.7 mg/dL (ref 0.3–1.2)
Total Protein: 6.5 g/dL (ref 6.5–8.1)

## 2020-10-29 LAB — ECHOCARDIOGRAM COMPLETE
Area-P 1/2: 3.77 cm2
Height: 62 in
S' Lateral: 2.7 cm
Weight: 2112 oz

## 2020-10-29 LAB — CBC
HCT: 32.7 % — ABNORMAL LOW (ref 36.0–46.0)
Hemoglobin: 10.3 g/dL — ABNORMAL LOW (ref 12.0–15.0)
MCH: 28.5 pg (ref 26.0–34.0)
MCHC: 31.5 g/dL (ref 30.0–36.0)
MCV: 90.6 fL (ref 80.0–100.0)
Platelets: 81 10*3/uL — ABNORMAL LOW (ref 150–400)
RBC: 3.61 MIL/uL — ABNORMAL LOW (ref 3.87–5.11)
RDW: 13.8 % (ref 11.5–15.5)
WBC: 8.9 10*3/uL (ref 4.0–10.5)
nRBC: 0 % (ref 0.0–0.2)

## 2020-10-29 LAB — RAPID URINE DRUG SCREEN, HOSP PERFORMED
Amphetamines: NOT DETECTED
Barbiturates: NOT DETECTED
Benzodiazepines: NOT DETECTED
Cocaine: NOT DETECTED
Opiates: NOT DETECTED
Tetrahydrocannabinol: NOT DETECTED

## 2020-10-29 LAB — HEPARIN LEVEL (UNFRACTIONATED)
Heparin Unfractionated: 0.3 IU/mL (ref 0.30–0.70)
Heparin Unfractionated: 0.34 IU/mL (ref 0.30–0.70)

## 2020-10-29 IMAGING — MR MR MRA HEAD W/O CM
1 series · 19 of 48 positions shown · non-contrast
Comparison: MRV brain [DATE]

CLINICAL DATA: Headache



[Series 1: 3d cow · axial · 0.5mm · 0.41mm/px · z∈[-80,+1]mm · 19 of 172 slices shown]
[im 1/172]
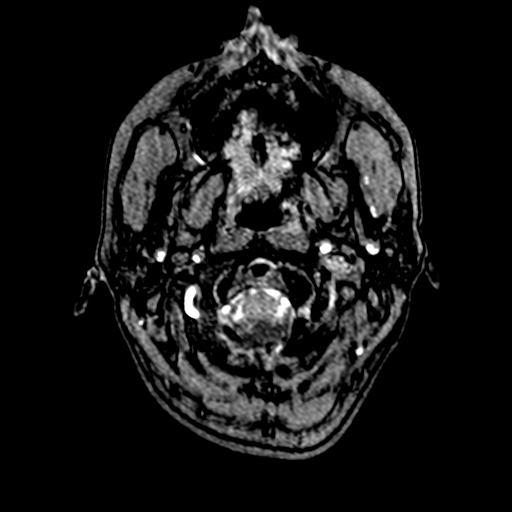
[im 4/172]
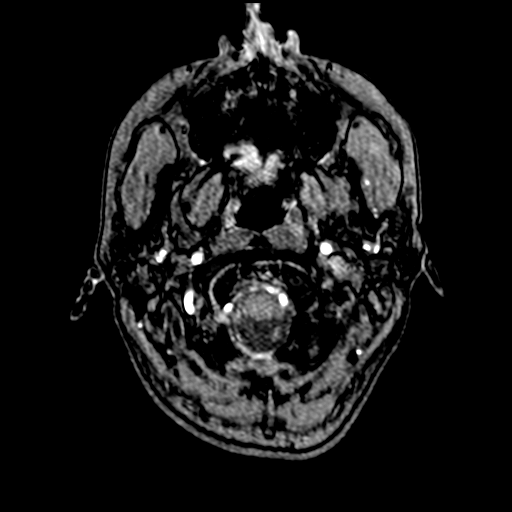
[im 8/172]
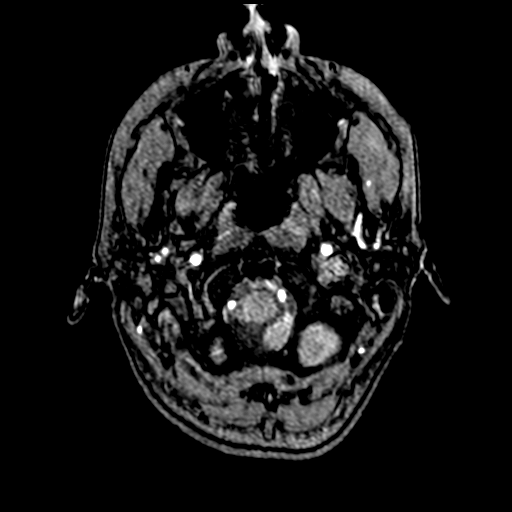
[im 11/172]
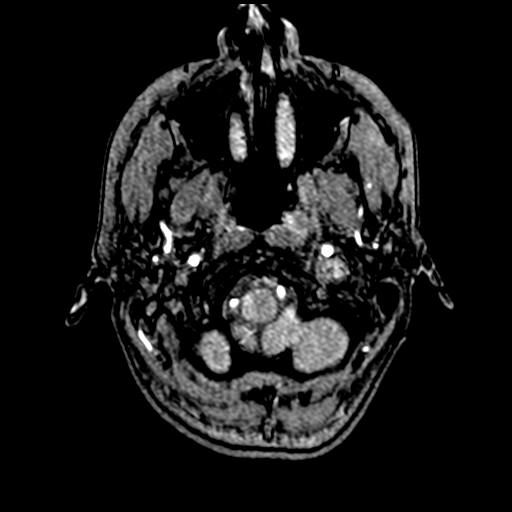
[im 15/172]
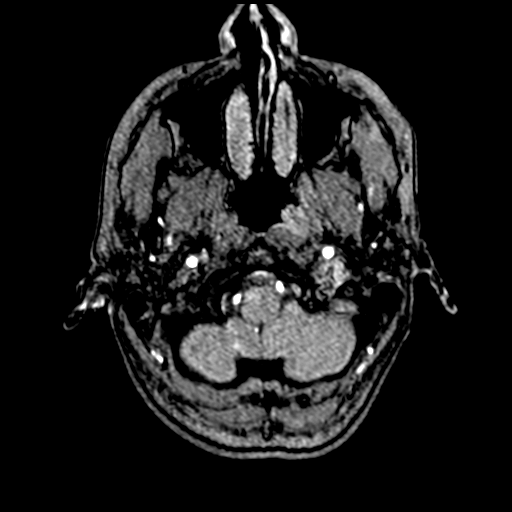
[im 19/172]
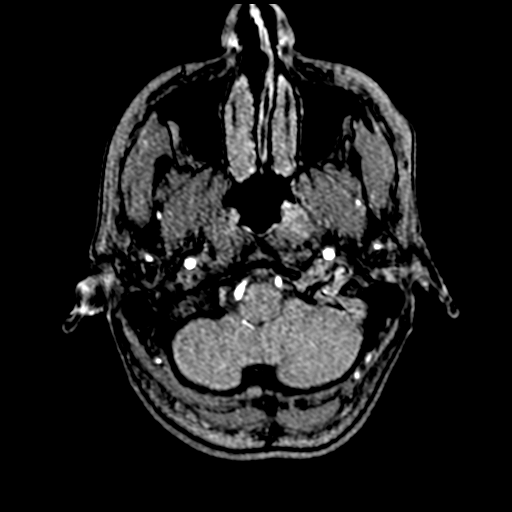
[im 22/172]
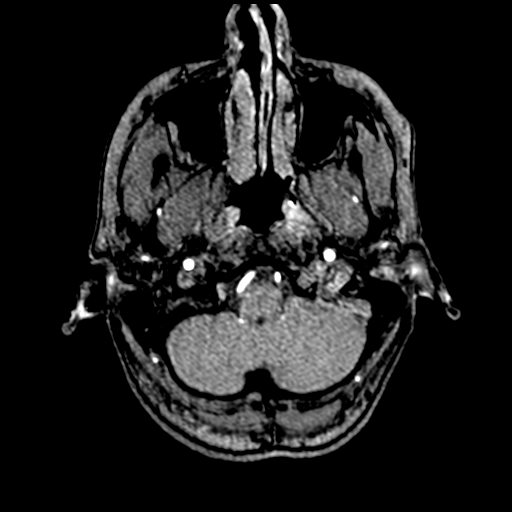
[im 26/172]
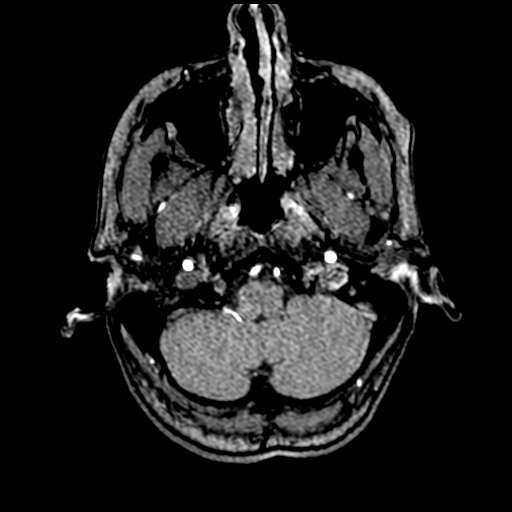
[im 30/172]
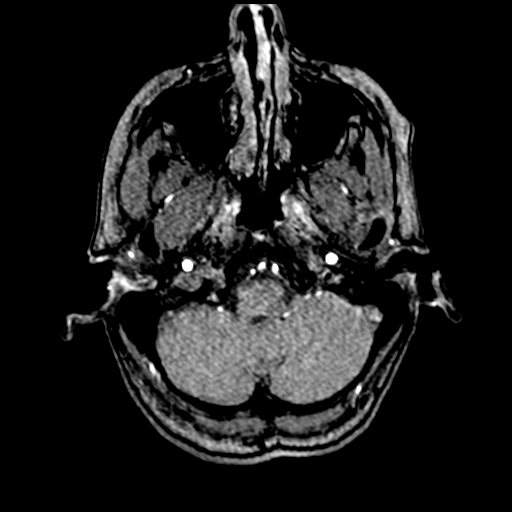
[im 33/172]
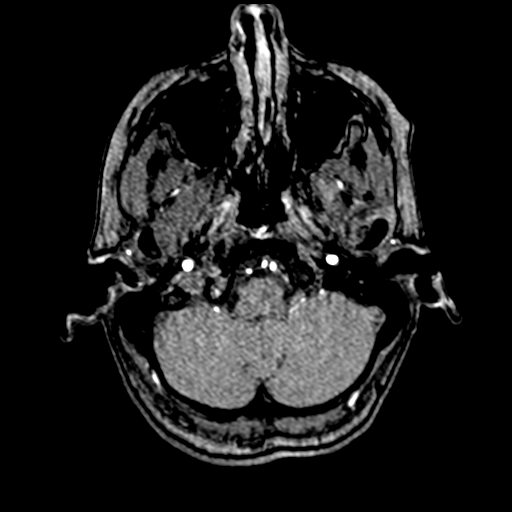
[im 37/172]
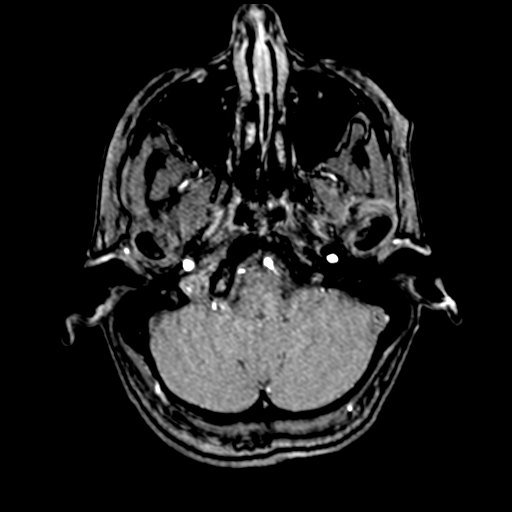
[im 55/172]
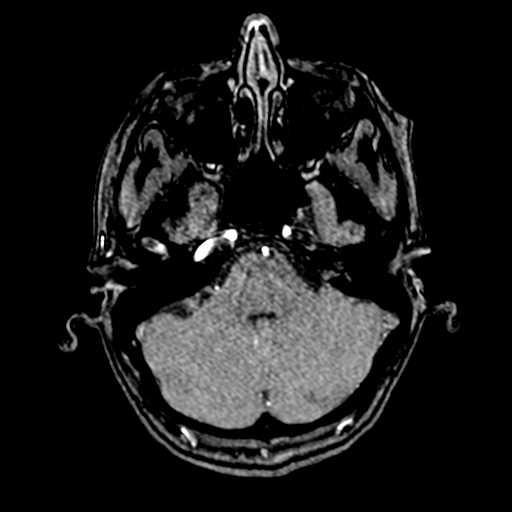
[im 77/172]
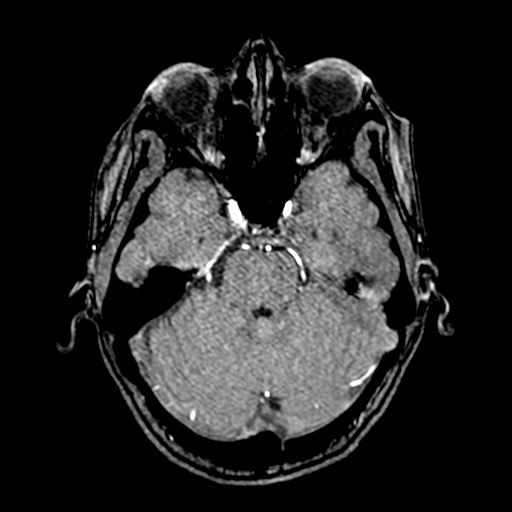
[im 88/172]
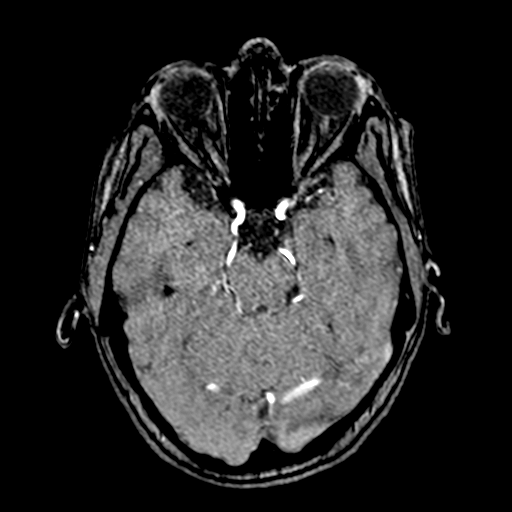
[im 99/172]
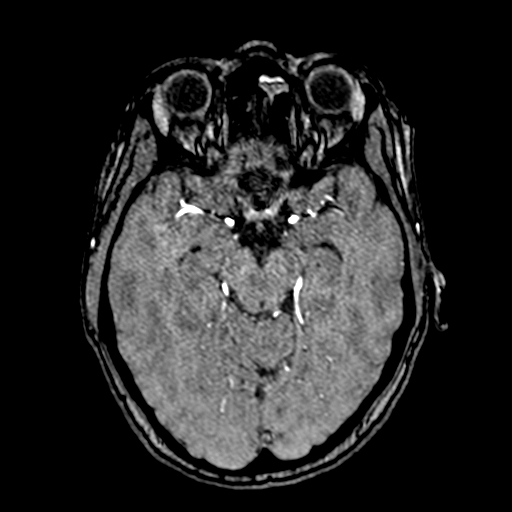
[im 121/172]
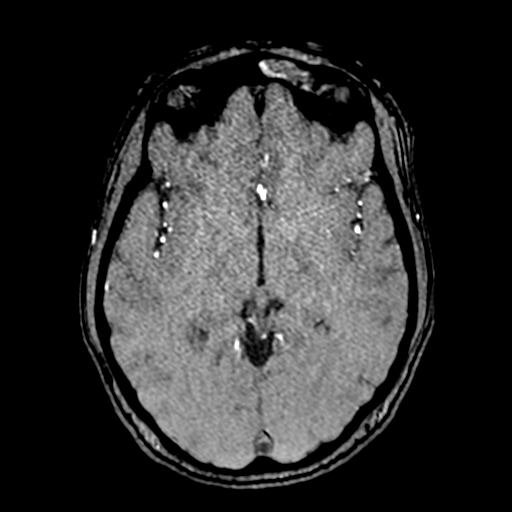
[im 142/172]
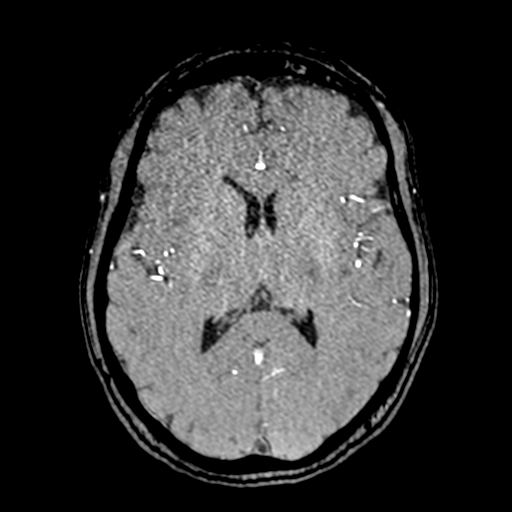
[im 146/172]
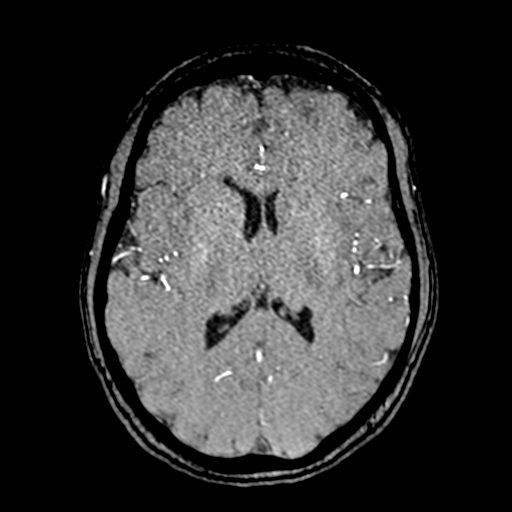
[im 164/172]
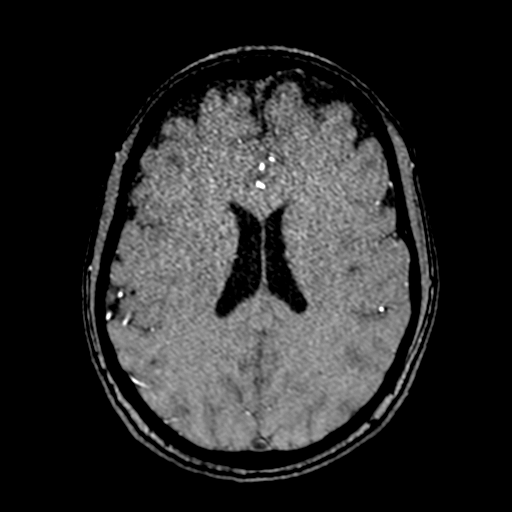

[19 of 48 positions shown; findings below may reference images not displayed]

FINDINGS: MRI HEAD FINDINGS

Brain: No acute infarct, mass effect or extra-axial collection. No
acute or chronic hemorrhage. Normal white matter signal, parenchymal
volume and CSF spaces. The midline structures are normal.

Vascular: Abnormal signal within the superior sagittal sinus, left
transverse sinus, left sigmoid sinus, compatible with recently
demonstrated dural venous thrombosis.

Skull and upper cervical spine: Normal calvarium and skull base.
Visualized upper cervical spine and soft tissues are normal.

Sinuses/Orbits:No paranasal sinus fluid levels or advanced mucosal
thickening. No mastoid or middle ear effusion. Normal orbits.

MRA HEAD FINDINGS

POSTERIOR CIRCULATION:

--Vertebral arteries: Normal

--Inferior cerebellar arteries: Normal.

--Basilar artery: Normal.

--Superior cerebellar arteries: Normal.

--Posterior cerebral arteries: Normal.

ANTERIOR CIRCULATION:

--Intracranial internal carotid arteries: Normal.

--Anterior cerebral arteries (ACA): Normal.

--Middle cerebral arteries (MCA): Normal.

ANATOMIC VARIANTS: Fetal origin of the right PCA.

MRA NECK FINDINGS

Normal 3 vessel branching pattern of the aortic arch. Vertebral
arteries are codominant. Both vertebral arteries are normal from
their origin to the skull base. There is no carotid stenosis or
occlusion.
IMPRESSION: 1. Abnormal signal within the superior sagittal sinus, left
transverse sinus, left sigmoid sinus, compatible with recently
demonstrated dural venous thrombosis.
2. No acute ischemia or hemorrhage.
3. Normal MRA of the head and neck.

## 2020-10-29 IMAGING — MR MR HEAD W/O CM
12 of 13 series · 44 of 48 positions shown · non-contrast
Comparison: MRV brain [DATE]

CLINICAL DATA: Headache



[Series 5: DWI · axial · 3.0mm · 0.88mm/px · z∈[-85,+61]mm · 8 of 100 slices shown (1 of 4)]
[im 1/100]
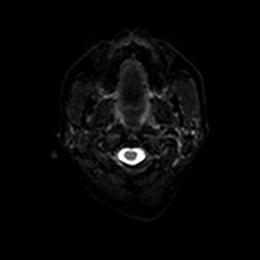
[im 15/100]
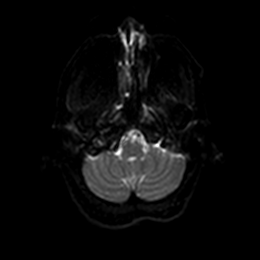
[im 29/100]
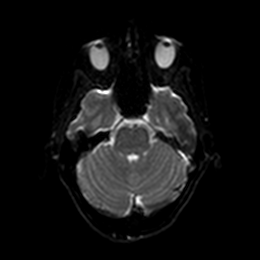
[im 43/100]
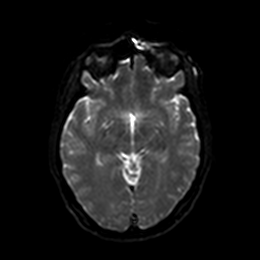
[im 57/100]
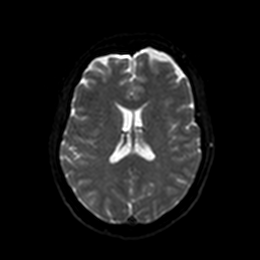
[im 71/100]
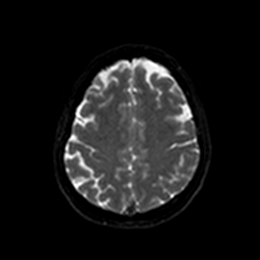
[im 85/100]
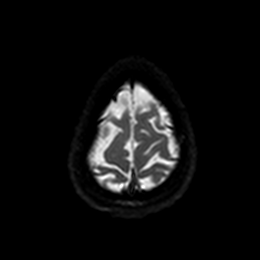
[im 100/100]
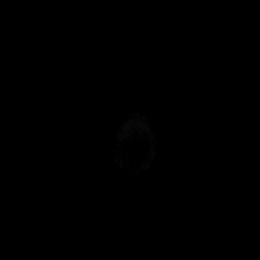

[Series 6: DWI · axial · 3.0mm · 0.88mm/px · z∈[-85,+61]mm · 4 of 50 slices shown (2 of 4)]
[im 1/50]
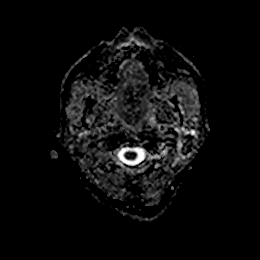
[im 17/50]
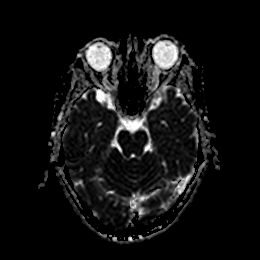
[im 33/50]
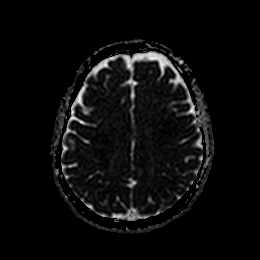
[im 50/50]
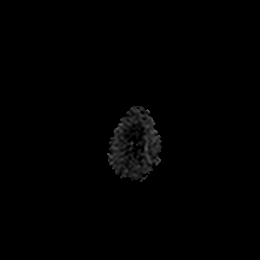

[Series 7: DWI · coronal · 4.0mm · 0.88mm/px · 5 of 68 slices shown (3 of 4)]
[im 1/68]
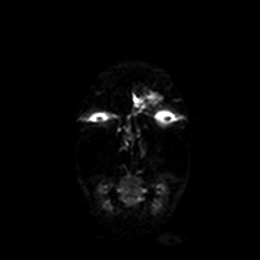
[im 17/68]
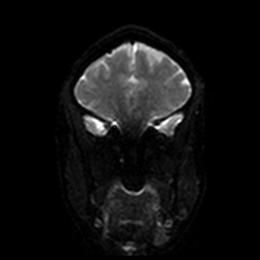
[im 34/68]
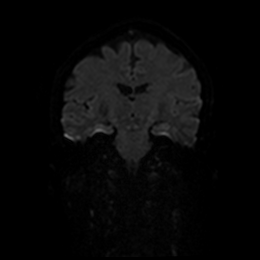
[im 51/68]
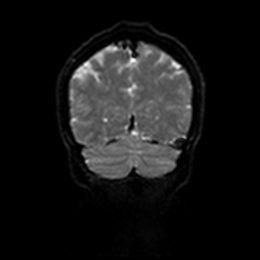
[im 68/68]
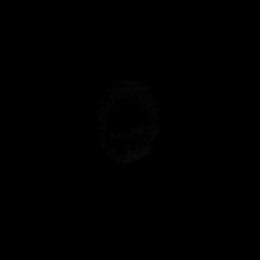

[Series 8: DWI · coronal · 4.0mm · 0.88mm/px · 3 of 34 slices shown (4 of 4)]
[im 1/34]
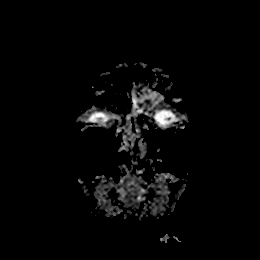
[im 17/34]
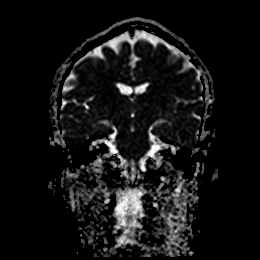
[im 34/34]
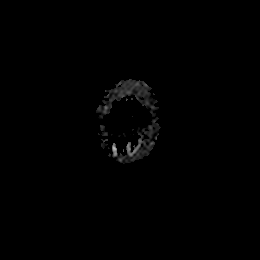

[Series 9: T1 · sagittal · 5.0mm · 0.75mm/px · 2 of 23 slices shown]
[im 1/23]
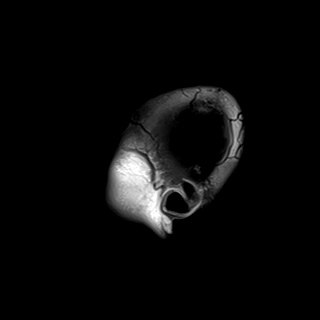
[im 23/23]
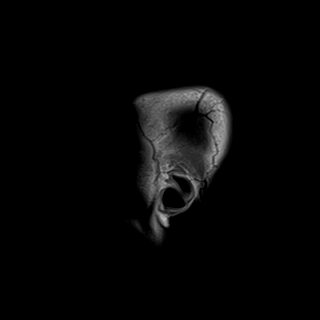

[Series 10: T2 · axial · 5.0mm · 0.72mm/px · z∈[-87,+62]mm · 2 of 26 slices shown (1 of 2)]
[im 1/26]
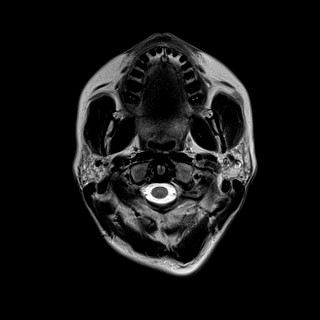
[im 26/26]
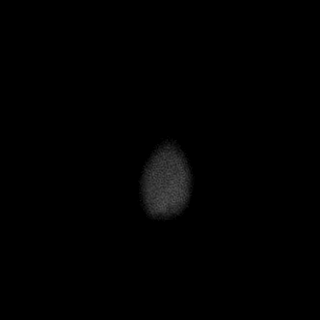

[Series 11: FLAIR · axial · 5.0mm · 0.45mm/px · z∈[-87,+63]mm · 2 of 26 slices shown]
[im 1/26]
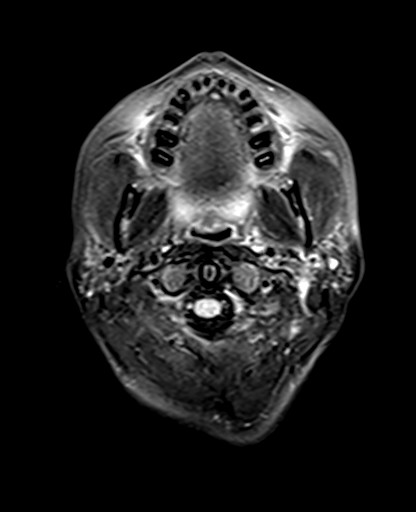
[im 26/26]
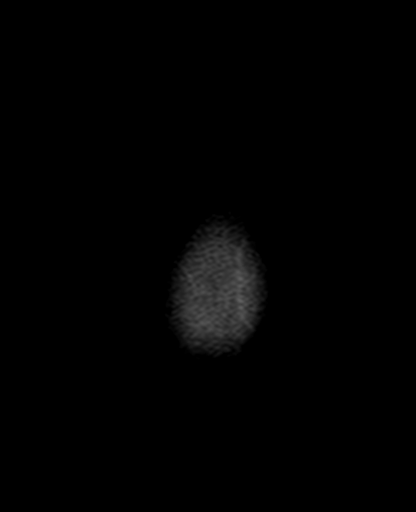

[Series 12: mag_images · axial · 3.0mm · 0.90mm/px · z∈[-100,+76]mm · 4 of 60 slices shown]
[im 1/60]
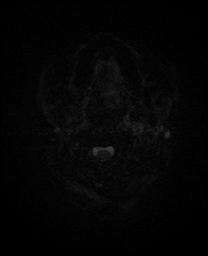
[im 20/60]
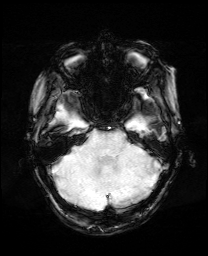
[im 40/60]
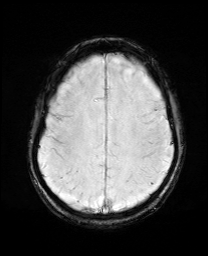
[im 60/60]
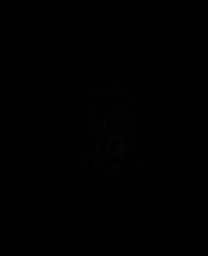

[Series 13: pha_images · axial · 3.0mm · 0.90mm/px · z∈[-100,+73]mm · 4 of 56 slices shown]
[im 1/56]
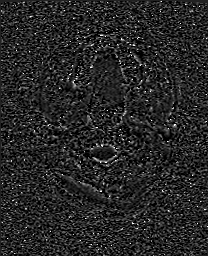
[im 19/56]
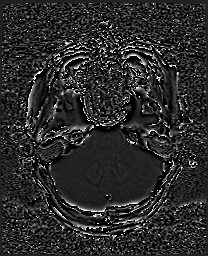
[im 37/56]
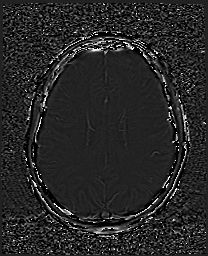
[im 56/56]
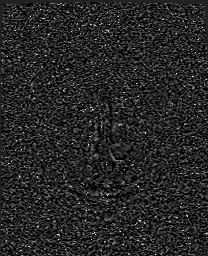

[Series 14: swi_images · axial · 3.0mm · 0.90mm/px · z∈[-100,+76]mm · 4 of 60 slices shown]
[im 1/60]
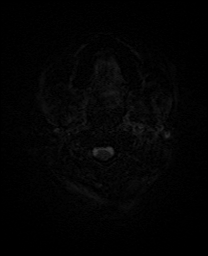
[im 20/60]
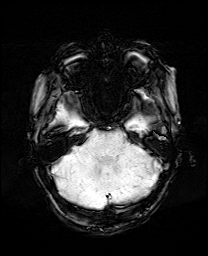
[im 40/60]
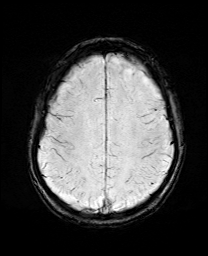
[im 60/60]
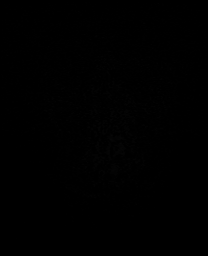

[Series 15: mip_images(sw) · axial · 24.0mm · 0.90mm/px · z∈[-90,+66]mm · 4 of 53 slices shown]
[im 1/53]
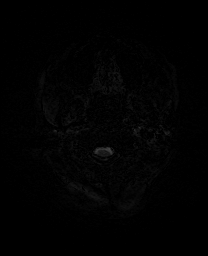
[im 18/53]
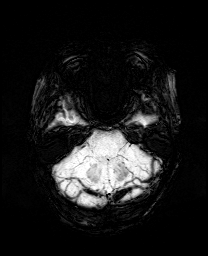
[im 35/53]
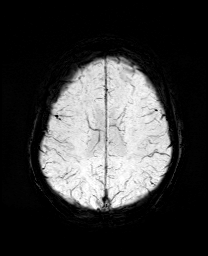
[im 53/53]
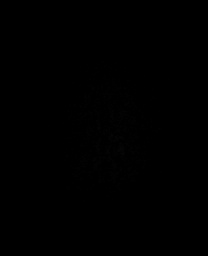

[Series 17: T2 · coronal · 5.0mm · 0.34mm/px · 2 of 29 slices shown (2 of 2)]
[im 1/29]
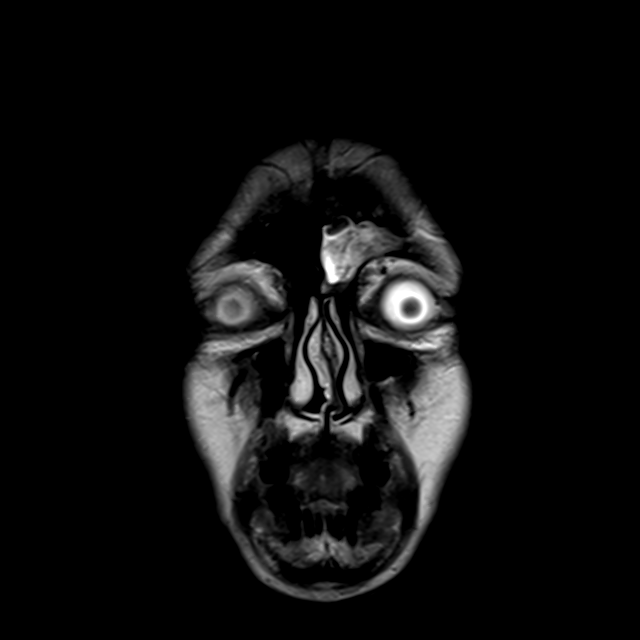
[im 29/29]
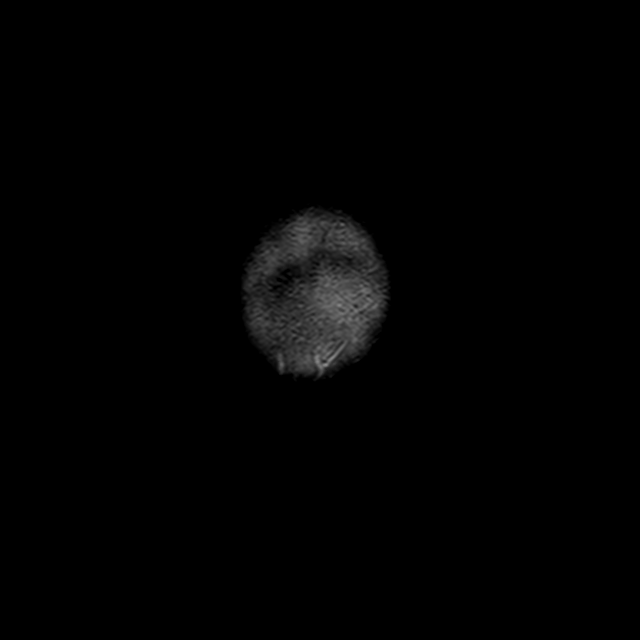

[44 of 48 positions shown; findings below may reference images not displayed]

FINDINGS: MRI HEAD FINDINGS

Brain: No acute infarct, mass effect or extra-axial collection. No
acute or chronic hemorrhage. Normal white matter signal, parenchymal
volume and CSF spaces. The midline structures are normal.

Vascular: Abnormal signal within the superior sagittal sinus, left
transverse sinus, left sigmoid sinus, compatible with recently
demonstrated dural venous thrombosis.

Skull and upper cervical spine: Normal calvarium and skull base.
Visualized upper cervical spine and soft tissues are normal.

Sinuses/Orbits:No paranasal sinus fluid levels or advanced mucosal
thickening. No mastoid or middle ear effusion. Normal orbits.

MRA HEAD FINDINGS

POSTERIOR CIRCULATION:

--Vertebral arteries: Normal

--Inferior cerebellar arteries: Normal.

--Basilar artery: Normal.

--Superior cerebellar arteries: Normal.

--Posterior cerebral arteries: Normal.

ANTERIOR CIRCULATION:

--Intracranial internal carotid arteries: Normal.

--Anterior cerebral arteries (ACA): Normal.

--Middle cerebral arteries (MCA): Normal.

ANATOMIC VARIANTS: Fetal origin of the right PCA.

MRA NECK FINDINGS

Normal 3 vessel branching pattern of the aortic arch. Vertebral
arteries are codominant. Both vertebral arteries are normal from
their origin to the skull base. There is no carotid stenosis or
occlusion.
IMPRESSION: 1. Abnormal signal within the superior sagittal sinus, left
transverse sinus, left sigmoid sinus, compatible with recently
demonstrated dural venous thrombosis.
2. No acute ischemia or hemorrhage.
3. Normal MRA of the head and neck.

## 2020-10-29 IMAGING — MR MR MRA NECK W/O CM
3 series · 41 of 48 positions shown · non-contrast
Comparison: MRV brain [DATE]

CLINICAL DATA: Headache



[Series 11: tof_fl3d_tra_iso · axial · 0.6mm · 0.52mm/px · z∈[-271,-61]mm · 25 of 373 slices shown]
[im 1/373]
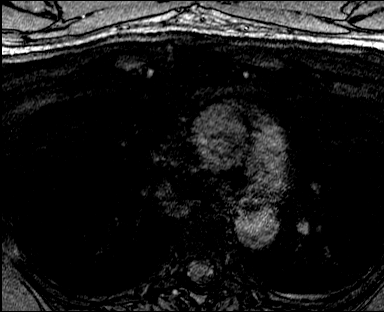
[im 13/373]
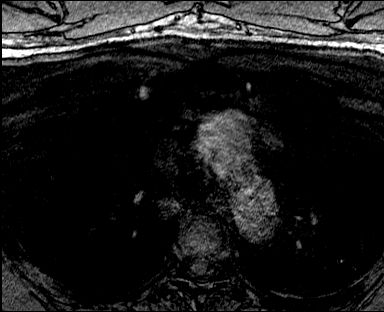
[im 25/373]
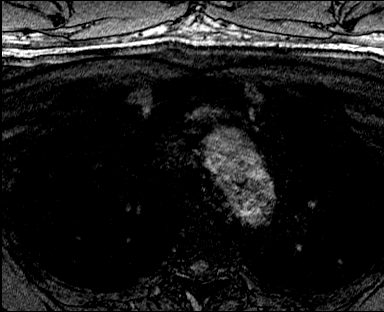
[im 37/373]
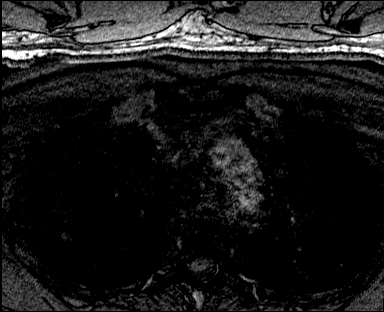
[im 49/373]
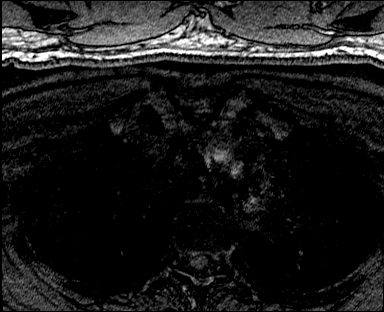
[im 61/373]
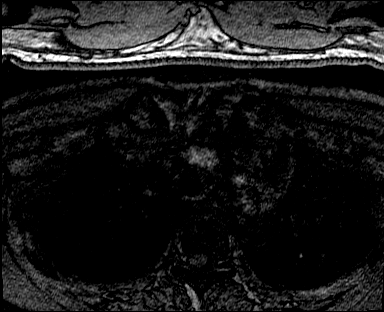
[im 73/373]
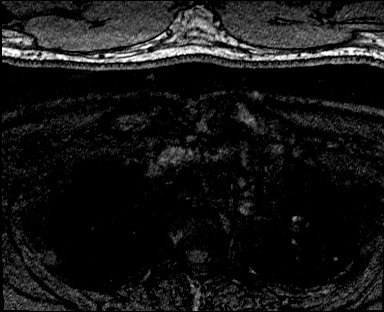
[im 85/373]
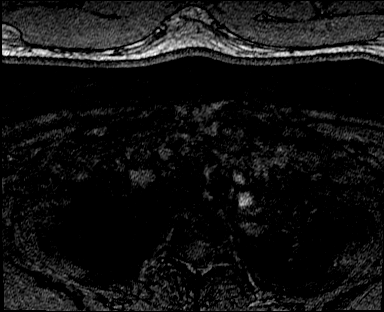
[im 97/373]
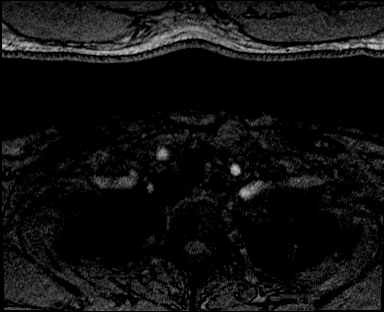
[im 109/373]
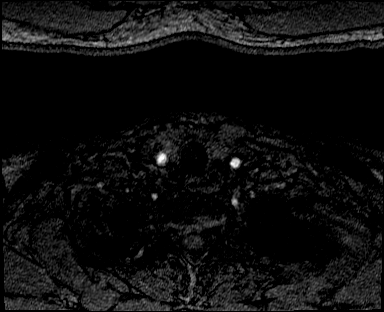
[im 121/373]
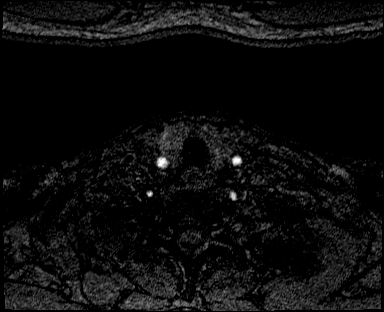
[im 133/373]
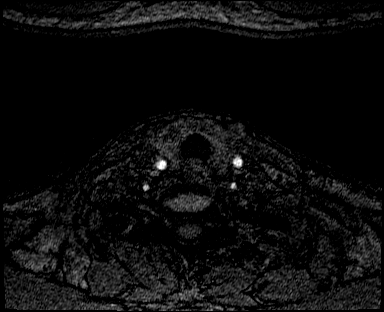
[im 145/373]
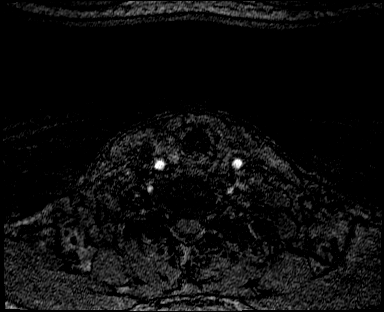
[im 157/373]
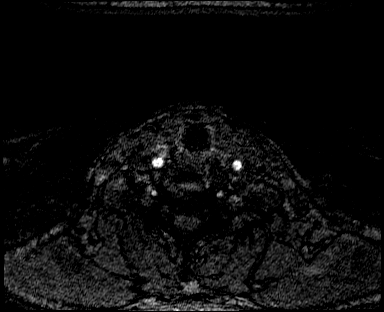
[im 169/373]
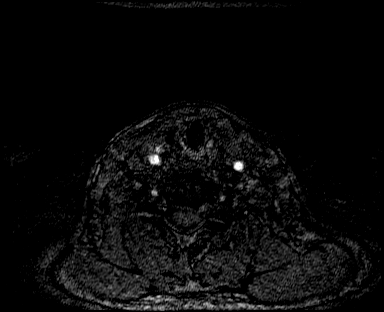
[im 181/373]
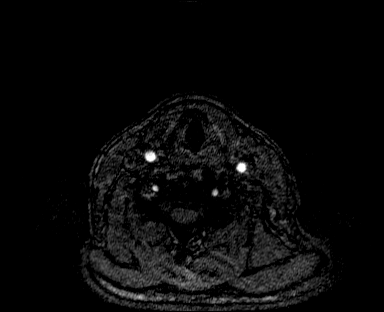
[im 193/373]
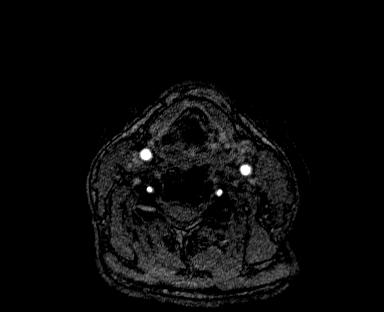
[im 205/373]
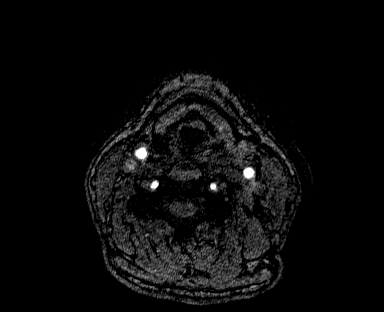
[im 217/373]
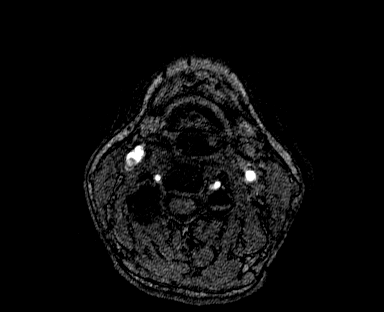
[im 229/373]
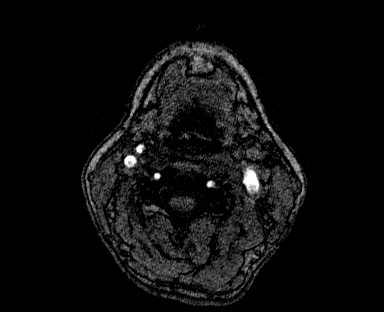
[im 241/373]
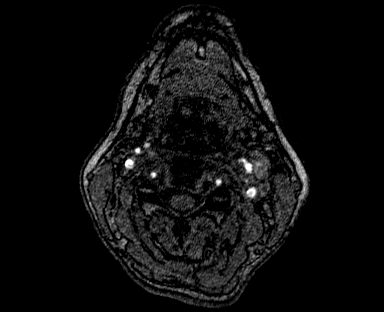
[im 253/373]
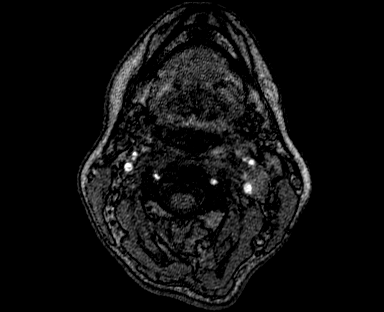
[im 265/373]
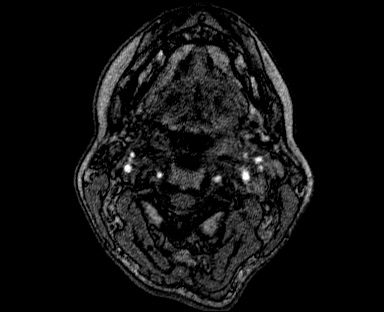
[im 313/373]
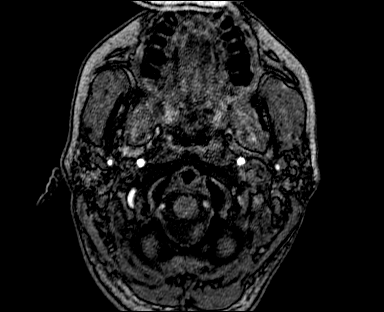
[im 361/373]
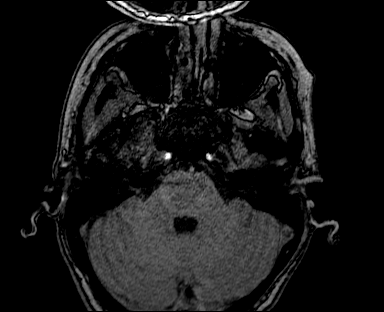

[Series 14: tof_fl2d_tra · axial · 3.0mm · 0.78mm/px · z∈[-261,-56]mm · 9 of 101 slices shown]
[im 1/101]
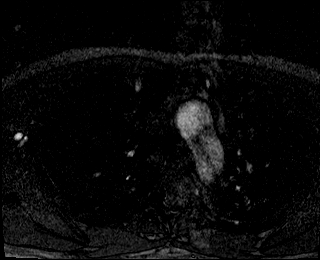
[im 13/101]
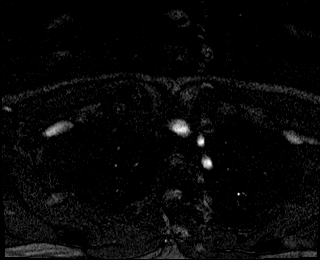
[im 26/101]
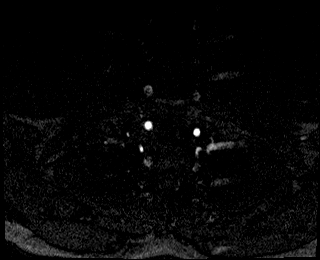
[im 38/101]
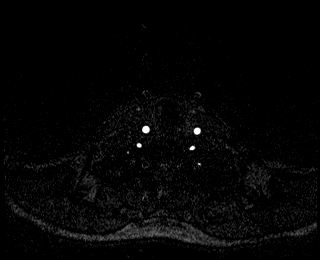
[im 51/101]
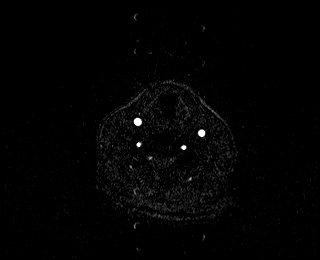
[im 63/101]
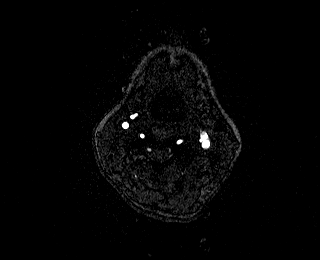
[im 76/101]
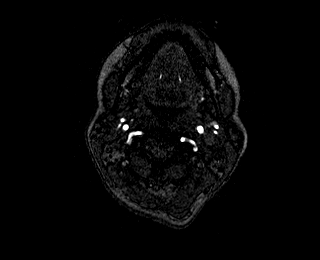
[im 88/101]
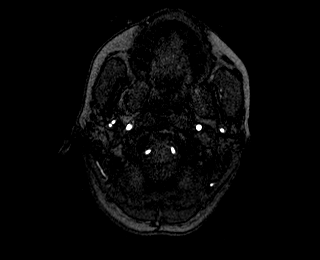
[im 101/101]
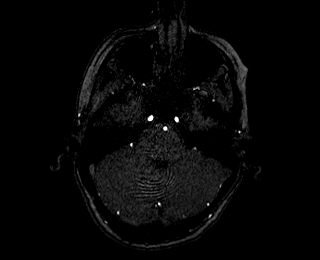

[Series 19: angio_fl3d_cor_pre_ttc=3.0s · coronal · 0.9mm · 0.85mm/px · 7 of 80 slices shown]
[im 1/80]
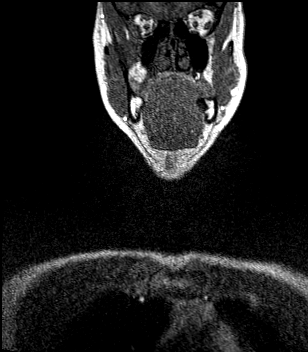
[im 14/80]
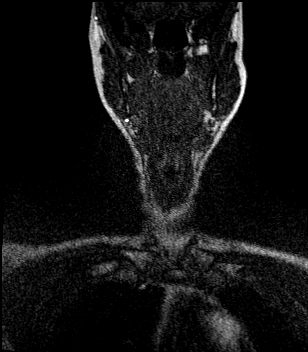
[im 27/80]
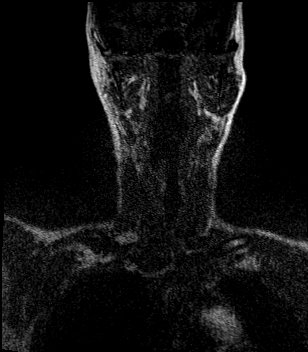
[im 40/80]
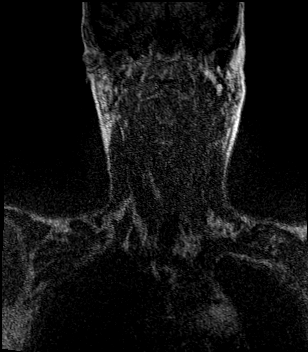
[im 53/80]
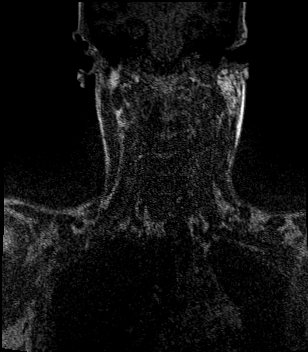
[im 66/80]
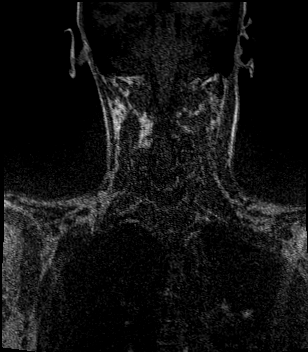
[im 80/80]
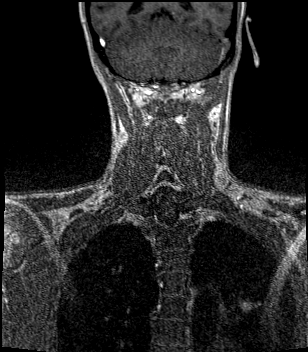

[41 of 48 positions shown; findings below may reference images not displayed]

FINDINGS: MRI HEAD FINDINGS

Brain: No acute infarct, mass effect or extra-axial collection. No
acute or chronic hemorrhage. Normal white matter signal, parenchymal
volume and CSF spaces. The midline structures are normal.

Vascular: Abnormal signal within the superior sagittal sinus, left
transverse sinus, left sigmoid sinus, compatible with recently
demonstrated dural venous thrombosis.

Skull and upper cervical spine: Normal calvarium and skull base.
Visualized upper cervical spine and soft tissues are normal.

Sinuses/Orbits:No paranasal sinus fluid levels or advanced mucosal
thickening. No mastoid or middle ear effusion. Normal orbits.

MRA HEAD FINDINGS

POSTERIOR CIRCULATION:

--Vertebral arteries: Normal

--Inferior cerebellar arteries: Normal.

--Basilar artery: Normal.

--Superior cerebellar arteries: Normal.

--Posterior cerebral arteries: Normal.

ANTERIOR CIRCULATION:

--Intracranial internal carotid arteries: Normal.

--Anterior cerebral arteries (ACA): Normal.

--Middle cerebral arteries (MCA): Normal.

ANATOMIC VARIANTS: Fetal origin of the right PCA.

MRA NECK FINDINGS

Normal 3 vessel branching pattern of the aortic arch. Vertebral
arteries are codominant. Both vertebral arteries are normal from
their origin to the skull base. There is no carotid stenosis or
occlusion.
IMPRESSION: 1. Abnormal signal within the superior sagittal sinus, left
transverse sinus, left sigmoid sinus, compatible with recently
demonstrated dural venous thrombosis.
2. No acute ischemia or hemorrhage.
3. Normal MRA of the head and neck.

## 2020-10-29 MED ORDER — SODIUM CHLORIDE 0.9 % IV SOLN: INTRAVENOUS | Status: DC

## 2020-10-29 MED ORDER — PROCHLORPERAZINE EDISYLATE 10 MG/2ML IJ SOLN
10.0000 mg | Freq: Four times a day (QID) | INTRAMUSCULAR | Status: DC | PRN
  Administered 2020-10-29 – 2020-10-30 (×3): 10 mg via INTRAVENOUS
  Filled 2020-10-29 (×4): qty 2

## 2020-10-29 MED ORDER — ATROPINE SULFATE 1 MG/10ML IJ SOSY: 0.5000 mg | PREFILLED_SYRINGE | Freq: Once | INTRAMUSCULAR | Status: DC | PRN

## 2020-10-29 MED ORDER — ATORVASTATIN CALCIUM 40 MG PO TABS
40.0000 mg | ORAL_TABLET | Freq: Every day | ORAL | Status: DC
  Administered 2020-10-29 – 2020-10-31 (×3): 40 mg via ORAL
  Filled 2020-10-29 (×3): qty 1

## 2020-10-29 MED ORDER — ALUM & MAG HYDROXIDE-SIMETH 200-200-20 MG/5ML PO SUSP
30.0000 mL | Freq: Four times a day (QID) | ORAL | Status: DC | PRN
  Administered 2020-10-29: 30 mL via ORAL
  Filled 2020-10-29: qty 30

## 2020-10-29 NOTE — Progress Notes (Signed)
Cross-coverage note:   Patient admitted to progressive bed, seen in f/u rounds while holding in ED. No acute issues. Discussed with RN.

## 2020-10-29 NOTE — Progress Notes (Signed)
STROKE TEAM PROGRESS NOTE   INTERVAL HISTORY RN at the bedside. Pt lying in bed, no acute distress, stated slight HA more pressure like at this time. MRI negative for ICH or infarct, however confirmed MRV result of cerebral venous sinus thrombosis.  Patient stated that she is taking JUNEL for hot flashes, which is estrogen product.  Vitals:   10/29/20 1000 10/29/20 1215 10/29/20 1241 10/29/20 1400  BP: 114/68 136/70  (!) 141/74  Pulse: (!) 53 68  (!) 46  Resp: 16 (!) 22  13  Temp:   98.2 F (36.8 C)   TempSrc:   Oral   SpO2: 97% 100%  97%  Weight:      Height:       CBC:  Recent Labs  Lab 10/28/20 0818 10/29/20 0236  WBC 10.9* 8.9  HGB 11.8* 10.3*  HCT 35.7* 32.7*  MCV 90.6 90.6  PLT 102* 81*   Basic Metabolic Panel:  Recent Labs  Lab 10/28/20 0818 10/29/20 0236  NA 136 137  K 3.1* 4.1  CL 104 104  CO2 21* 23  GLUCOSE 125* 99  BUN 14 15  CREATININE 1.37* 1.24*  CALCIUM 8.9 8.5*   Lab Results  Component Value Date   CHOL 239 (H) 08/27/2020   HDL 79 08/27/2020   LDLCALC 143 (H) 08/27/2020   TRIG 73 08/27/2020   CHOLHDL 3.0 08/27/2020    Lab Results  Component Value Date   HGBA1C 5.5 08/27/2020   Urine Drug Screen:  Recent Labs  Lab 10/28/20 1420  LABOPIA NONE DETECTED  COCAINSCRNUR NONE DETECTED  LABBENZ NONE DETECTED  AMPHETMU NONE DETECTED  THCU NONE DETECTED  LABBARB NONE DETECTED    Alcohol Level No results for input(s): ETH in the last 168 hours.  IMAGING past 24 hours MR ANGIO HEAD WO CONTRAST  Result Date: 10/29/2020 CLINICAL DATA:  Headache EXAM: MRI HEAD WITHOUT CONTRAST MRA HEAD WITHOUT CONTRAST MRA NECK WITHOUT CONTRAST TECHNIQUE: Multiplanar, multiecho pulse sequences of the brain and surrounding structures were obtained without intravenous contrast. Angiographic images of the Circle of Willis were obtained using MRA technique without intravenous contrast. Angiographic images of the neck were obtained using MRA technique without  intravenous contrast. Carotid stenosis measurements (when applicable) are obtained utilizing NASCET criteria, using the distal internal carotid diameter as the denominator. COMPARISON:  MRV brain 10/28/2020 FINDINGS: MRI HEAD FINDINGS Brain: No acute infarct, mass effect or extra-axial collection. No acute or chronic hemorrhage. Normal white matter signal, parenchymal volume and CSF spaces. The midline structures are normal. Vascular: Abnormal signal within the superior sagittal sinus, left transverse sinus, left sigmoid sinus, compatible with recently demonstrated dural venous thrombosis. Skull and upper cervical spine: Normal calvarium and skull base. Visualized upper cervical spine and soft tissues are normal. Sinuses/Orbits:No paranasal sinus fluid levels or advanced mucosal thickening. No mastoid or middle ear effusion. Normal orbits. MRA HEAD FINDINGS POSTERIOR CIRCULATION: --Vertebral arteries: Normal --Inferior cerebellar arteries: Normal. --Basilar artery: Normal. --Superior cerebellar arteries: Normal. --Posterior cerebral arteries: Normal. ANTERIOR CIRCULATION: --Intracranial internal carotid arteries: Normal. --Anterior cerebral arteries (ACA): Normal. --Middle cerebral arteries (MCA): Normal. ANATOMIC VARIANTS: Fetal origin of the right PCA. MRA NECK FINDINGS Normal 3 vessel branching pattern of the aortic arch. Vertebral arteries are codominant. Both vertebral arteries are normal from their origin to the skull base. There is no carotid stenosis or occlusion. IMPRESSION: 1. Abnormal signal within the superior sagittal sinus, left transverse sinus, left sigmoid sinus, compatible with recently demonstrated dural venous thrombosis. 2. No acute  ischemia or hemorrhage. 3. Normal MRA of the head and neck. Electronically Signed   By: Deatra Robinson M.D.   On: 10/29/2020 02:58   MR ANGIO NECK WO CONTRAST  Result Date: 10/29/2020 CLINICAL DATA:  Headache EXAM: MRI HEAD WITHOUT CONTRAST MRA HEAD WITHOUT  CONTRAST MRA NECK WITHOUT CONTRAST TECHNIQUE: Multiplanar, multiecho pulse sequences of the brain and surrounding structures were obtained without intravenous contrast. Angiographic images of the Circle of Willis were obtained using MRA technique without intravenous contrast. Angiographic images of the neck were obtained using MRA technique without intravenous contrast. Carotid stenosis measurements (when applicable) are obtained utilizing NASCET criteria, using the distal internal carotid diameter as the denominator. COMPARISON:  MRV brain 10/28/2020 FINDINGS: MRI HEAD FINDINGS Brain: No acute infarct, mass effect or extra-axial collection. No acute or chronic hemorrhage. Normal white matter signal, parenchymal volume and CSF spaces. The midline structures are normal. Vascular: Abnormal signal within the superior sagittal sinus, left transverse sinus, left sigmoid sinus, compatible with recently demonstrated dural venous thrombosis. Skull and upper cervical spine: Normal calvarium and skull base. Visualized upper cervical spine and soft tissues are normal. Sinuses/Orbits:No paranasal sinus fluid levels or advanced mucosal thickening. No mastoid or middle ear effusion. Normal orbits. MRA HEAD FINDINGS POSTERIOR CIRCULATION: --Vertebral arteries: Normal --Inferior cerebellar arteries: Normal. --Basilar artery: Normal. --Superior cerebellar arteries: Normal. --Posterior cerebral arteries: Normal. ANTERIOR CIRCULATION: --Intracranial internal carotid arteries: Normal. --Anterior cerebral arteries (ACA): Normal. --Middle cerebral arteries (MCA): Normal. ANATOMIC VARIANTS: Fetal origin of the right PCA. MRA NECK FINDINGS Normal 3 vessel branching pattern of the aortic arch. Vertebral arteries are codominant. Both vertebral arteries are normal from their origin to the skull base. There is no carotid stenosis or occlusion. IMPRESSION: 1. Abnormal signal within the superior sagittal sinus, left transverse sinus, left  sigmoid sinus, compatible with recently demonstrated dural venous thrombosis. 2. No acute ischemia or hemorrhage. 3. Normal MRA of the head and neck. Electronically Signed   By: Deatra Robinson M.D.   On: 10/29/2020 02:58   MR BRAIN WO CONTRAST  Result Date: 10/29/2020 CLINICAL DATA:  Headache EXAM: MRI HEAD WITHOUT CONTRAST MRA HEAD WITHOUT CONTRAST MRA NECK WITHOUT CONTRAST TECHNIQUE: Multiplanar, multiecho pulse sequences of the brain and surrounding structures were obtained without intravenous contrast. Angiographic images of the Circle of Willis were obtained using MRA technique without intravenous contrast. Angiographic images of the neck were obtained using MRA technique without intravenous contrast. Carotid stenosis measurements (when applicable) are obtained utilizing NASCET criteria, using the distal internal carotid diameter as the denominator. COMPARISON:  MRV brain 10/28/2020 FINDINGS: MRI HEAD FINDINGS Brain: No acute infarct, mass effect or extra-axial collection. No acute or chronic hemorrhage. Normal white matter signal, parenchymal volume and CSF spaces. The midline structures are normal. Vascular: Abnormal signal within the superior sagittal sinus, left transverse sinus, left sigmoid sinus, compatible with recently demonstrated dural venous thrombosis. Skull and upper cervical spine: Normal calvarium and skull base. Visualized upper cervical spine and soft tissues are normal. Sinuses/Orbits:No paranasal sinus fluid levels or advanced mucosal thickening. No mastoid or middle ear effusion. Normal orbits. MRA HEAD FINDINGS POSTERIOR CIRCULATION: --Vertebral arteries: Normal --Inferior cerebellar arteries: Normal. --Basilar artery: Normal. --Superior cerebellar arteries: Normal. --Posterior cerebral arteries: Normal. ANTERIOR CIRCULATION: --Intracranial internal carotid arteries: Normal. --Anterior cerebral arteries (ACA): Normal. --Middle cerebral arteries (MCA): Normal. ANATOMIC VARIANTS: Fetal  origin of the right PCA. MRA NECK FINDINGS Normal 3 vessel branching pattern of the aortic arch. Vertebral arteries are codominant. Both vertebral arteries are normal from  their origin to the skull base. There is no carotid stenosis or occlusion. IMPRESSION: 1. Abnormal signal within the superior sagittal sinus, left transverse sinus, left sigmoid sinus, compatible with recently demonstrated dural venous thrombosis. 2. No acute ischemia or hemorrhage. 3. Normal MRA of the head and neck. Electronically Signed   By: Ulyses Jarred M.D.   On: 10/29/2020 02:58   MR MRV HEAD W WO CONTRAST  Result Date: 10/28/2020 CLINICAL DATA:  Headache, near syncope EXAM: MR VENOGRAM HEAD WITHOUT AND WITH CONTRAST TECHNIQUE: Angiographic images of the intracranial venous structures were obtained using MRV technique without and with intravenous contrast. CONTRAST:  54mL GADAVIST GADOBUTROL 1 MMOL/ML IV SOLN COMPARISON:  None. FINDINGS: There is nonocclusive thrombus extending from the mid superior sagittal sinus posteriorly. A sliver extends posteriorly pre remainder of the superior sagittal sinus sinus is patent. Straight sinus, vein of Galen, and internal cerebral veins are patent. Right transverse and sigmoid sinuses are patent. Dominant left transverse sinus is patent. There is absent enhancement of the left sigmoid sinus and visualized left internal jugular vein. There is no abnormal intracranial enhancement. 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 Thrombus within the left sigmoid sinus and visualized left internal jugular vein. These results were called by telephone at the time of interpretation on 10/28/2020 at 5:00 pm to provider Dr. Almyra Free, Who verbally acknowledged these results. Electronically Signed   By: Macy Mis M.D.   On: 10/28/2020 17:03   ECHOCARDIOGRAM COMPLETE  Result Date: 10/29/2020    ECHOCARDIOGRAM REPORT   Patient Name:   Sydney [name]  Date of Exam: 10/29/2020 Medical Rec #:  [id number]            Height:       62.0 in Accession #:    [id number]           Weight:       132.0 lb Date of Birth:  February 01, 1966           BSA:          1.602 m Patient Age:    33 years             BP:           131/72 mmHg Patient Gender: F                    HR:           47 bpm. Exam Location:  Inpatient Procedure: 2D Echo, Color Doppler and Cardiac Doppler Indications:    Stroke 434.91 / I163.9  History:        Patient has no prior history of Echocardiogram examinations.  Sonographer:    Bernadene Person RDCS Referring Phys: [id number] [place]  1. Left ventricular ejection fraction, by estimation, is 55 to 60%. The left ventricle has normal function. The left ventricle has no regional wall motion abnormalities. Left ventricular diastolic parameters were normal.  2. Right ventricular systolic function is normal. The right ventricular size is normal. There is normal pulmonary artery systolic pressure. The estimated right ventricular systolic pressure is 123XX123 mmHg.  3. The mitral valve is normal in structure. No evidence of mitral valve regurgitation. No evidence of mitral stenosis.  4. The aortic valve is tricuspid. Aortic valve regurgitation is not visualized. No aortic stenosis is present.  5. The inferior vena cava is normal in size with greater than 50% respiratory variability, suggesting right atrial pressure of 3 mmHg. FINDINGS  Left Ventricle: Left ventricular ejection fraction, by estimation, is 55 to 60%. The left ventricle has normal function. The left ventricle has no regional wall motion abnormalities. The left ventricular internal cavity size was normal in size. There is  no left ventricular hypertrophy. Left ventricular diastolic parameters were normal. Right Ventricle: The right ventricular size is normal. No increase in right ventricular wall thickness. Right ventricular systolic function is normal. There is normal pulmonary artery systolic  pressure. The tricuspid regurgitant velocity is 2.69 m/s, and  with an assumed right atrial pressure of 3 mmHg, the estimated right ventricular systolic pressure is 123XX123 mmHg. Left Atrium: Left atrial size was normal in size. Right Atrium: Right atrial size was normal in size. Pericardium: There is no evidence of pericardial effusion. Mitral Valve: The mitral valve is normal in structure. No evidence of mitral valve regurgitation. No evidence of mitral valve stenosis. Tricuspid Valve: The tricuspid valve is normal in structure. Tricuspid valve regurgitation is trivial. Aortic Valve: The aortic valve is tricuspid. Aortic valve regurgitation is not visualized. No aortic stenosis is present. Pulmonic Valve: The pulmonic valve was normal in structure. Pulmonic valve regurgitation is not visualized. Aorta: The aortic root is normal in size and structure. Venous: The inferior vena cava is normal in size with greater than 50% respiratory variability, suggesting right atrial pressure of 3 mmHg. IAS/Shunts: No atrial level shunt detected by color flow Doppler.  LEFT VENTRICLE PLAX 2D LVIDd:         3.90 cm  Diastology LVIDs:         2.70 cm  LV e' medial:    11.60 cm/s LV PW:         1.20 cm  LV E/e' medial:  8.1 LV IVS:        0.70 cm  LV e' lateral:   12.70 cm/s LVOT diam:     1.90 cm  LV E/e' lateral: 7.4 LV SV:         81 LV SV Index:   50 LVOT Area:     2.84 cm  RIGHT VENTRICLE RV S prime:     13.10 cm/s TAPSE (M-mode): 2.8 cm LEFT ATRIUM             Index       RIGHT ATRIUM           Index LA diam:        2.40 cm 1.50 cm/m  RA Area:     11.30 cm LA Vol (A2C):   44.0 ml 27.46 ml/m RA Volume:   20.10 ml  12.55 ml/m LA Vol (A4C):   48.3 ml 30.15 ml/m LA Biplane Vol: 46.7 ml 29.15 ml/m  AORTIC VALVE LVOT Vmax:   117.00 cm/s LVOT Vmean:  80.300 cm/s LVOT VTI:    0.285 m  AORTA Ao Root diam: 2.90 cm Ao Asc diam:  2.60 cm MITRAL VALVE               TRICUSPID VALVE MV Area (PHT): 3.77 cm    TR Peak grad:   28.9 mmHg MV  Decel Time: 201 msec    TR Vmax:        269.00 cm/s MV E velocity: 93.60 cm/s MV A velocity: 63.50 cm/s  SHUNTS MV E/A ratio:  1.47        Systemic VTI:  0.29 m  Systemic Diam: 1.90 cm Loralie Champagne MD Electronically signed by Loralie Champagne MD Signature Date/Time: 10/29/2020/12:53:25 PM    Final    CT MAXILLOFACIAL WO CONTRAST  Result Date: 10/28/2020 CLINICAL DATA:  Maxillofacial pain. Headache. 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 EXAM: CT MAXILLOFACIAL WITHOUT CONTRAST TECHNIQUE: Multidetector CT imaging of the maxillofacial structures was performed. Multiplanar CT image reconstructions were also generated. COMPARISON:  CT head 10/28/2020.  MR venogram 10/28/2020 FINDINGS: Osseous: Negative for fracture. No bone lesion identified. No acute dental infection Orbits: Normal orbit. No mass or edema in the orbit. Normal bony orbit. Sinuses: Frontal sinus extensive mucosal edema left clear. Frontal sinus. Right remaining paranasal sinuses clear. Mastoid sinus clear bilaterally. Soft tissues: No soft tissue mass or adenopathy.  Normal pharynx The left jugular vein is distended and ill-defined suggesting left jugular vein thrombosis. There is filling defect in the sigmoid sinus and proximal left jugular vein on MRV of the head today. Limited intracranial: Negative IMPRESSION: 1. Findings consistent with thrombus in the left jugular vein. This corresponds with filling defect in the left sigmoid sinus and jugular vein on MR venogram from today. 2. Mucosal edema left frontal sinus.  Remaining sinuses clear. Electronically Signed   By: Franchot Gallo M.D.   On: 10/28/2020 19:43    PHYSICAL EXAM  Temp:  [98.2 F (36.8 C)-98.7 F (37.1 C)] 98.7 F (37.1 C) (12/31 1519) Pulse Rate:  [39-68] 49 (12/31 1519) Resp:  [11-27] 18 (12/31 1519) BP: (100-169)/(65-89) 169/84 (12/31 1519) SpO2:  [97 %-100 %] 100 % (12/31  1519)  General - Well nourished, well developed, in no apparent distress.  Ophthalmologic - fundi not visualized due to noncooperation.  Cardiovascular - Regular rhythm and rate.  Mental Status -  Level of arousal and orientation to time, place, and person were intact. Language including expression, naming, repetition, comprehension was assessed and found intact. Attention span and concentration were normal. Recent and remote memory were intact. Fund of Knowledge was assessed and was intact.  Cranial Nerves II - XII - II - Visual field intact OU. III, IV, VI - Extraocular movements intact. V - Facial sensation intact bilaterally. VII - Facial movement intact bilaterally. VIII - Hearing & vestibular intact bilaterally. X - Palate elevates symmetrically. XI - Chin turning & shoulder shrug intact bilaterally. XII - Tongue protrusion intact.  Motor Strength - The patient's strength was normal in all extremities and pronator drift was absent.  Bulk was normal and fasciculations were absent.   Motor Tone - Muscle tone was assessed at the neck and appendages and was normal.  Reflexes - The patient's reflexes were symmetrical in all extremities and she had no pathological reflexes.  Sensory - Light touch, temperature/pinprick were assessed and were symmetrical.    Coordination - The patient had normal movements in the hands and feet with no ataxia or dysmetria.  Tremor was absent.  Gait and Station - deferred.   ASSESSMENT/PLAN Sydney [name] is a 54 y.o. female with history of stage II CKD, anemia, on hormonal therapy for hot flashes with presumed sinus and ea infections over the past few months presenting with severe HA.   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 Thrombus L sigmoid sinus and L internal jugular.   CT maxfac thrombus L jugular. L  frontal sinus mucosal edema.  MRI  Abnormal sinal superior sagittal sinus, L transverse sinus, L sigmoid sinus. No acute ischemia or hemorrhage   MRA Head & neck   Unremarkable   2D Echo EF 55-60%. No source of embolus   LDL 143 08/27/20  HgbA1c 5.5 08/27/20  Hypercoagulable labs pending   VTE prophylaxis - IV heparin  No antithrombotic prior to admission, now on heparin IV. If pt stable tolerating IV heparin for 2 days, recommend to switch to DOAC with either eliquis or pradaxa   Disposition:  Return home  Estrogen use  On JUNEL for hot flashes  Recommend to stop  Continue follow-up with OB/GYN  Hypertension  Home meds:  None - no hx HTN  slightly elevated 140s . Long-term BP goal normotensive  Hyperlipidemia  Home meds:  No statin  LDL 143 08/27/20  On Lipitor 40  Continue statin on discharge  AKI on CKD stage 2  Cre 1.37->1.24,   hx RI in setting of rhabdo.   baseline Cre 1.1  On IVF @ 50  Encourage po intake  Other Stroke Risk Factors    Other Active Problems  Thrombocytopenia 102->81  Anemia 11.8->10.3  Hospital day # 1  I discussed with Dr. Alfredia Ferguson. I spent  35 minutes in total face-to-face time with the patient, more than 50% of which was spent in counseling and coordination of care, reviewing test results, images and medication, and discussing the diagnosis, treatment plan and potential prognosis. This patient's care requiresreview of multiple databases, neurological assessment, discussion with family, other specialists and medical decision making of high complexity.  Rosalin Hawking, MD PhD Stroke Neurology 10/29/2020 7:03 PM  To contact Stroke Continuity provider, please refer to [email or web address] After hours, contact General Neurology

## 2020-10-29 NOTE — ED Notes (Signed)
Admitting team at bedside.

## 2020-10-29 NOTE — ED Notes (Signed)
Ordered breakfast 

## 2020-10-29 NOTE — ED Notes (Signed)
Pt is NSR on monitor 

## 2020-10-29 NOTE — Progress Notes (Signed)
PROGRESS NOTE    Sydney [name]  [id number] DOB: 07/15/1966 DOA: 10/28/2020 PCP: [name], DO   Brief Narrative:  HPI per Dr. Domenic Polite on 10/28/20 Sydney [name] is a 54 y.o. female with medical history significant for mild kidney disease, rhabdomyolysis few years ago but no other significant medical history presented to the ED today with multiple complaints. -Over the past 3 weeks she has been treated by her PCP with multiple courses of antibiotics (amoxicillin followed by cefdinir )for sinus infections which progressed to middle ear infections, she also had a course of prednisone.  Her ears and sinuses started improving however developed pain and swelling in the left side of her neck associated with headaches over the last few days.  Describes the headache as throbbing, right behind her years and close to her left ear as well.  -Today when she was walking started developing nausea and some dizziness, and a few episodes of vomiting and felt like she was about to pass out.  She denies any loss of consciousness. -Denies any fevers or chills, denies any symptoms of Covid, she has received 2 Pfizer shots in January and February followed by booster in November. ED Course: Vital signs were unremarkable, labs were notable for potassium 3.1, creatinine of 1.3, white count of 10.9, Covid PCR and urine hCG were negative.  She had an MRV done which was notable for Nonocclusive thrombus within the mid superior sagittal sinus extending posteriorly. Thrombus within the left sigmoid sinus and visualized left internal jugular vein  **Interim History Neuorlogy was consulted and patient was started on IV heparin for her cerebral venous thrombosis.  Neurology also recommended hypercoagulation labs and an MRI of the brain to evaluate for underlying venous infarcts as well as a stat head CT if she had a change in neurological status.  Patient continued to have some nausea vomiting that was  not amenable to ondansetron so she was started on Compazine.  Neurology feels her thrombus could be related to estrogen use.  They are recommending continuing heparin drip for few days and if she improves and has no bleeding then can safely change to Eliquis or Pradaxa.  Assessment & Plan:   Principal Problem:   Cerebral venous sinus thrombosis Active Problems:   CKD (chronic kidney disease) stage 3, GFR 30-59 ml/min (HCC)   Cerebral venous sinus thrombosis, acute  Cerebral venous sinus thrombosis with presentation of headache -Etiologies could be potentially infectious, recent long course of ear infections/ ? Chronic otitis media potentialy affecting the dural sinus, will check CT head and neck, add Empiric Unasyn pending further imaging -Neurology feels that it could be related to her estrogen usage -Hormonal oral contraceptive pills (Junel 1/20 1-20) is an important risk factor, she has been on this since August -Neurology consulted and recommended IV heparin for now -Will also order hypercoagulable panel -Per Neuro- start IV Heparin and continue for a few days and transition to Pradaxa or Eliquis if tolerates heparin drip -MRI done of the head, the neck as well as the brain that showed no acute infarct, mass-effect or extra axial collection no acute or chronic hemorrhage.  There was normal white matter signal and parenchymal volume and CFS spaces.  Overall impression was that there is an abnormal signal within the superior sagittal sinus left transverse sinus, left sigmoid sinus, compatible recently demonstrated dural venous thrombosis and no acute ischemia or hemorrhage.  Normal MRA of the head neck -will admit to progressive unit for first  24-48hours and follow neurology recommendations  Nausea and vomiting setting of headache -Likely related to above -Continue with antiemetics and supportive care   AKi on CKD (chronic kidney disease) stage 2  -Prior history of renal insufficiency in  the setting of rhabdomyolysis, baseline creatinine was 1.1 in November -Creatinine is 1.3 today, continue gentle hydration overnight and her BUN/creatinine is now 50/1.24 -Aoid nephrotoxic medications, hypotension, contrast dyes as well as renally dose medications -Repeat CMP in a.m. and continue monitor renal function  Leukocytosis  -Improved and likely in the above setting -Patient's WBC went from 10.9 is now 8.9 -continue monitor for signs and symptoms infection -repeat CBC in a.m.  Normocytic anemia -Patient's hemoglobin/hematocrit went from 11.8/35.7 is now 10.3/32.7 -check anemia panel in the a.m. -Continue to monitor for signs and symptoms of bleeding she is anticoagulated on a heparin drip; currently no overt bleeding noted -repeat CBC in a.m.  Thrombocytopenia -patient platelet count of 102 on admission is now 20 -continue to monitor for signs and symptoms of bleeding; currently no overt bleeding -repeat CBC in a.m.  Hypokalemia -This was replete and potassium went from 3.1 is now 4.1 -Continue to monitor and replete as necessary -Repeat CMP in a.m.   DVT prophylaxis: Anticoagulated with a heparin drip Code Status: Full code Family Communication: No family present at bedside Disposition Plan: Pending further neurological clearance and evaluation.  Will need PT OT to evaluate  Status is: Inpatient  Remains inpatient appropriate because:Unsafe d/c plan, IV treatments appropriate due to intensity of illness or inability to take PO and Inpatient level of care appropriate due to severity of illness   Dispo: The patient is from: Home              Anticipated d/c is to: TBD              Anticipated d/c date is: 2 days              Patient currently is not medically stable to d/c.   Consultants:   Neurology   Procedures:  ECHOCARDIOGRAM IMPRESSIONS    1. Left ventricular ejection fraction, by estimation, is 55 to 60%. The  left ventricle has normal function.  The left ventricle has no regional  wall motion abnormalities. Left ventricular diastolic parameters were  normal.  2. Right ventricular systolic function is normal. The right ventricular  size is normal. There is normal pulmonary artery systolic pressure. The  estimated right ventricular systolic pressure is 123XX123 mmHg.  3. The mitral valve is normal in structure. No evidence of mitral valve  regurgitation. No evidence of mitral stenosis.  4. The aortic valve is tricuspid. Aortic valve regurgitation is not  visualized. No aortic stenosis is present.  5. The inferior vena cava is normal in size with greater than 50%  respiratory variability, suggesting right atrial pressure of 3 mmHg.   FINDINGS  Left Ventricle: Left ventricular ejection fraction, by estimation, is 55  to 60%. The left ventricle has normal function. The left ventricle has no  regional wall motion abnormalities. The left ventricular internal cavity  size was normal in size. There is  no left ventricular hypertrophy. Left ventricular diastolic parameters  were normal.   Right Ventricle: The right ventricular size is normal. No increase in  right ventricular wall thickness. Right ventricular systolic function is  normal. There is normal pulmonary artery systolic pressure. The tricuspid  regurgitant velocity is 2.69 m/s, and  with an assumed right atrial pressure of 3  mmHg, the estimated right  ventricular systolic pressure is 123XX123 mmHg.   Left Atrium: Left atrial size was normal in size.   Right Atrium: Right atrial size was normal in size.   Pericardium: There is no evidence of pericardial effusion.   Mitral Valve: The mitral valve is normal in structure. No evidence of  mitral valve regurgitation. No evidence of mitral valve stenosis.   Tricuspid Valve: The tricuspid valve is normal in structure. Tricuspid  valve regurgitation is trivial.   Aortic Valve: The aortic valve is tricuspid. Aortic valve  regurgitation is  not visualized. No aortic stenosis is present.   Pulmonic Valve: The pulmonic valve was normal in structure. Pulmonic valve  regurgitation is not visualized.   Aorta: The aortic root is normal in size and structure.   Venous: The inferior vena cava is normal in size with greater than 50%  respiratory variability, suggesting right atrial pressure of 3 mmHg.   IAS/Shunts: No atrial level shunt detected by color flow Doppler.     LEFT VENTRICLE  PLAX 2D  LVIDd:     3.90 cm Diastology  LVIDs:     2.70 cm LV e' medial:  11.60 cm/s  LV PW:     1.20 cm LV E/e' medial: 8.1  LV IVS:    0.70 cm LV e' lateral:  12.70 cm/s  LVOT diam:   1.90 cm LV E/e' lateral: 7.4  LV SV:     81  LV SV Index:  50  LVOT Area:   2.84 cm     RIGHT VENTRICLE  RV S prime:   13.10 cm/s  TAPSE (M-mode): 2.8 cm   LEFT ATRIUM       Index    RIGHT ATRIUM      Index  LA diam:    2.40 cm 1.50 cm/m RA Area:   11.30 cm  LA Vol (A2C):  44.0 ml 27.46 ml/m RA Volume:  20.10 ml 12.55 ml/m  LA Vol (A4C):  48.3 ml 30.15 ml/m  LA Biplane Vol: 46.7 ml 29.15 ml/m  AORTIC VALVE  LVOT Vmax:  117.00 cm/s  LVOT Vmean: 80.300 cm/s  LVOT VTI:  0.285 m    AORTA  Ao Root diam: 2.90 cm  Ao Asc diam: 2.60 cm   MITRAL VALVE        TRICUSPID VALVE  MV Area (PHT): 3.77 cm  TR Peak grad:  28.9 mmHg  MV Decel Time: 201 msec  TR Vmax:    269.00 cm/s  MV E velocity: 93.60 cm/s  MV A velocity: 63.50 cm/s SHUNTS  MV E/A ratio: 1.47    Systemic VTI: 0.29 m               Systemic Diam: 1.90 cm   Antimicrobials:  Anti-infectives (From admission, onward)   Start     Dose/Rate Route Frequency Ordered Stop   10/28/20 1900  Ampicillin-Sulbactam (UNASYN) 3 g in sodium chloride 0.9 % 100 mL IVPB        3 g 200 mL/hr over 30 Minutes Intravenous Every 6 hours 10/28/20 1848          Subjective: Seen and  examined at bedside no still having headache and was still extremely nauseous.  No chest pain but still felt a little lightheaded.  No shortness of breath.  No other concerns or complaints at this time.  Objective: Vitals:   10/29/20 1215 10/29/20 1241 10/29/20 1400 10/29/20 1519  BP: 136/70  (!) 141/74 (!) 169/84  Pulse: 68  (!) 58 (!) 49  Resp: (!) 22  13 18   Temp:  98.2 F (36.8 C)  98.7 F (37.1 C)  TempSrc:  Oral  Oral  SpO2: 100%  97% 100%  Weight:      Height:        Intake/Output Summary (Last 24 hours) at 10/29/2020 1726 Last data filed at 10/29/2020 1500 Gross per 24 hour  Intake 2533.82 ml  Output -  Net 2533.82 ml   Filed Weights   10/28/20 0816  Weight: 59.9 kg   Examination: Physical Exam:  Constitutional: Thin Caucasian female currently who appears calm but does appear uncomfortable and does appear nauseous Eyes: Lids and conjunctivae normal, sclerae anicteric  ENMT: External Ears, Nose appear normal. Grossly normal hearing.  Neck: Appears normal, supple, no cervical masses, normal ROM, no appreciable thyromegaly; no JVD Respiratory: Diminished to auscultation bilaterally, no wheezing, rales, rhonchi or crackles. Normal respiratory effort and patient is not tachypenic. No accessory muscle use.  Unlabored breathing Cardiovascular: Slightly bradycardic but regular rate, no murmurs / rubs / gallops. S1 and S2 auscultated. No extremity edema.  Abdomen: Soft, non-tender, non-distended.  Bowel sounds positive.  GU: Deferred. Musculoskeletal: No clubbing / cyanosis of digits/nails. No joint deformity upper and lower extremities.  Skin: No rashes, lesions, ulcers on to skin evaluation. No induration; Warm and dry.  Neurologic: CN 2-12 grossly intact with no focal deficits. Romberg sign and cerebellar reflexes not assessed.  Psychiatric: Normal judgment and insight. Alert and oriented x 3. Normal mood and appropriate affect.   Data Reviewed: I have personally  reviewed following labs and imaging studies  CBC: Recent Labs  Lab 10/28/20 0818 10/29/20 0236  WBC 10.9* 8.9  HGB 11.8* 10.3*  HCT 35.7* 32.7*  MCV 90.6 90.6  PLT 102* 81*   Basic Metabolic Panel: Recent Labs  Lab 10/28/20 0818 10/29/20 0236  NA 136 137  K 3.1* 4.1  CL 104 104  CO2 21* 23  GLUCOSE 125* 99  BUN 14 15  CREATININE 1.37* 1.24*  CALCIUM 8.9 8.5*   GFR: Estimated Creatinine Clearance: 42 mL/min (A) (by C-G formula based on SCr of 1.24 mg/dL (H)). Liver Function Tests: Recent Labs  Lab 10/29/20 0236  AST 19  ALT 12  ALKPHOS 33*  BILITOT 0.7  PROT 6.5  ALBUMIN 2.8*   No results for input(s): LIPASE, AMYLASE in the last 168 hours. No results for input(s): AMMONIA in the last 168 hours. Coagulation Profile: No results for input(s): INR, PROTIME in the last 168 hours. Cardiac Enzymes: No results for input(s): CKTOTAL, CKMB, CKMBINDEX, TROPONINI in the last 168 hours. BNP (last 3 results) No results for input(s): PROBNP in the last 8760 hours. HbA1C: No results for input(s): HGBA1C in the last 72 hours. CBG: Recent Labs  Lab 10/28/20 0823  GLUCAP 120*   Lipid Profile: No results for input(s): CHOL, HDL, LDLCALC, TRIG, CHOLHDL, LDLDIRECT in the last 72 hours. Thyroid Function Tests: No results for input(s): TSH, T4TOTAL, FREET4, T3FREE, THYROIDAB in the last 72 hours. Anemia Panel: No results for input(s): VITAMINB12, FOLATE, FERRITIN, TIBC, IRON, RETICCTPCT in the last 72 hours. Sepsis Labs: No results for input(s): PROCALCITON, LATICACIDVEN in the last 168 hours.  Recent Results (from the past 240 hour(s))  Resp Panel by RT-PCR (Flu A&B, Covid) Nasopharyngeal Swab     Status: None   Collection Time: 10/28/20 10:46 AM   Specimen: Nasopharyngeal Swab; Nasopharyngeal(NP) swabs in vial transport medium  Result Value Ref  Range Status   SARS Coronavirus 2 by RT PCR NEGATIVE NEGATIVE Final    Comment: (NOTE) SARS-CoV-2 target nucleic acids are  NOT DETECTED.  The SARS-CoV-2 RNA is generally detectable in upper respiratory specimens during the acute phase of infection. The lowest concentration of SARS-CoV-2 viral copies this assay can detect is 138 copies/mL. A negative result does not preclude SARS-Cov-2 infection and should not be used as the sole basis for treatment or other patient management decisions. A negative result may occur with  improper specimen collection/handling, submission of specimen other than nasopharyngeal swab, presence of viral mutation(s) within the areas targeted by this assay, and inadequate number of viral copies(<138 copies/mL). A negative result must be combined with clinical observations, patient history, and epidemiological information. The expected result is Negative.  Fact Sheet for Patients:  BloggerCourse.com  Fact Sheet for Healthcare Providers:  SeriousBroker.it  This test is no t yet approved or cleared by the [place] FDA and  has been authorized for detection and/or diagnosis of SARS-CoV-2 by FDA under an Emergency Use Authorization (EUA). This EUA will remain  in effect (meaning this test can be used) for the duration of the COVID-19 declaration under Section 564(b)(1) of the Act, 21 U.S.C.section 360bbb-3(b)(1), unless the authorization is terminated  or revoked sooner.       Influenza A by PCR NEGATIVE NEGATIVE Final   Influenza B by PCR NEGATIVE NEGATIVE Final    Comment: (NOTE) The Xpert Xpress SARS-CoV-2/FLU/RSV plus assay is intended as an aid in the diagnosis of influenza from Nasopharyngeal swab specimens and should not be used as a sole basis for treatment. Nasal washings and aspirates are unacceptable for Xpert Xpress SARS-CoV-2/FLU/RSV testing.  Fact Sheet for Patients: BloggerCourse.com  Fact Sheet for Healthcare Providers: SeriousBroker.it  This test is not  yet approved or cleared by the [place] FDA and has been authorized for detection and/or diagnosis of SARS-CoV-2 by FDA under an Emergency Use Authorization (EUA). This EUA will remain in effect (meaning this test can be used) for the duration of the COVID-19 declaration under Section 564(b)(1) of the Act, 21 U.S.C. section 360bbb-3(b)(1), unless the authorization is terminated or revoked.  Performed at [facility], [address], [place], [place] [address]      RN Pressure Injury Documentation:     Estimated body mass index is 24.14 kg/m as calculated from the following:   Height as of this encounter: 5\' 2"  (1.575 m).   Weight as of this encounter: 59.9 kg.  Malnutrition Type:   Malnutrition Characteristics:   Nutrition Interventions:     Radiology Studies: CT HEAD WO CONTRAST  Result Date: 10/28/2020 CLINICAL DATA:  Headache, nausea. EXAM: CT HEAD WITHOUT CONTRAST TECHNIQUE: Contiguous axial images were obtained from the base of the skull through the vertex without intravenous contrast. COMPARISON:  None. FINDINGS: Brain: No evidence of acute infarction, hemorrhage, hydrocephalus, extra-axial collection or mass lesion/mass effect. Vascular: No hyperdense vessel or unexpected calcification. Skull: Normal. Negative for fracture or focal lesion. Sinuses/Orbits: No acute finding. Other: None. IMPRESSION: Normal head CT. Electronically Signed   By: 10/30/2020 M.D.   On: 10/28/2020 09:28   MR ANGIO HEAD WO CONTRAST  Result Date: 10/29/2020 CLINICAL DATA:  Headache EXAM: MRI HEAD WITHOUT CONTRAST MRA HEAD WITHOUT CONTRAST MRA NECK WITHOUT CONTRAST TECHNIQUE: Multiplanar, multiecho pulse sequences of the brain and surrounding structures were obtained without intravenous contrast. Angiographic images of the Circle of Willis were obtained using MRA technique without  intravenous contrast. Angiographic images of the neck were obtained using MRA technique without  intravenous contrast. Carotid stenosis measurements (when applicable) are obtained utilizing NASCET criteria, using the distal internal carotid diameter as the denominator. COMPARISON:  MRV brain 10/28/2020 FINDINGS: MRI HEAD FINDINGS Brain: No acute infarct, mass effect or extra-axial collection. No acute or chronic hemorrhage. Normal white matter signal, parenchymal volume and CSF spaces. The midline structures are normal. Vascular: Abnormal signal within the superior sagittal sinus, left transverse sinus, left sigmoid sinus, compatible with recently demonstrated dural venous thrombosis. Skull and upper cervical spine: Normal calvarium and skull base. Visualized upper cervical spine and soft tissues are normal. Sinuses/Orbits:No paranasal sinus fluid levels or advanced mucosal thickening. No mastoid or middle ear effusion. Normal orbits. MRA HEAD FINDINGS POSTERIOR CIRCULATION: --Vertebral arteries: Normal --Inferior cerebellar arteries: Normal. --Basilar artery: Normal. --Superior cerebellar arteries: Normal. --Posterior cerebral arteries: Normal. ANTERIOR CIRCULATION: --Intracranial internal carotid arteries: Normal. --Anterior cerebral arteries (ACA): Normal. --Middle cerebral arteries (MCA): Normal. ANATOMIC VARIANTS: Fetal origin of the right PCA. MRA NECK FINDINGS Normal 3 vessel branching pattern of the aortic arch. Vertebral arteries are codominant. Both vertebral arteries are normal from their origin to the skull base. There is no carotid stenosis or occlusion. IMPRESSION: 1. Abnormal signal within the superior sagittal sinus, left transverse sinus, left sigmoid sinus, compatible with recently demonstrated dural venous thrombosis. 2. No acute ischemia or hemorrhage. 3. Normal MRA of the head and neck. Electronically Signed   By: Ulyses Jarred M.D.   On: 10/29/2020 02:58   MR ANGIO NECK WO CONTRAST  Result Date: 10/29/2020 CLINICAL DATA:  Headache EXAM: MRI HEAD WITHOUT CONTRAST MRA HEAD WITHOUT  CONTRAST MRA NECK WITHOUT CONTRAST TECHNIQUE: Multiplanar, multiecho pulse sequences of the brain and surrounding structures were obtained without intravenous contrast. Angiographic images of the Circle of Willis were obtained using MRA technique without intravenous contrast. Angiographic images of the neck were obtained using MRA technique without intravenous contrast. Carotid stenosis measurements (when applicable) are obtained utilizing NASCET criteria, using the distal internal carotid diameter as the denominator. COMPARISON:  MRV brain 10/28/2020 FINDINGS: MRI HEAD FINDINGS Brain: No acute infarct, mass effect or extra-axial collection. No acute or chronic hemorrhage. Normal white matter signal, parenchymal volume and CSF spaces. The midline structures are normal. Vascular: Abnormal signal within the superior sagittal sinus, left transverse sinus, left sigmoid sinus, compatible with recently demonstrated dural venous thrombosis. Skull and upper cervical spine: Normal calvarium and skull base. Visualized upper cervical spine and soft tissues are normal. Sinuses/Orbits:No paranasal sinus fluid levels or advanced mucosal thickening. No mastoid or middle ear effusion. Normal orbits. MRA HEAD FINDINGS POSTERIOR CIRCULATION: --Vertebral arteries: Normal --Inferior cerebellar arteries: Normal. --Basilar artery: Normal. --Superior cerebellar arteries: Normal. --Posterior cerebral arteries: Normal. ANTERIOR CIRCULATION: --Intracranial internal carotid arteries: Normal. --Anterior cerebral arteries (ACA): Normal. --Middle cerebral arteries (MCA): Normal. ANATOMIC VARIANTS: Fetal origin of the right PCA. MRA NECK FINDINGS Normal 3 vessel branching pattern of the aortic arch. Vertebral arteries are codominant. Both vertebral arteries are normal from their origin to the skull base. There is no carotid stenosis or occlusion. IMPRESSION: 1. Abnormal signal within the superior sagittal sinus, left transverse sinus, left  sigmoid sinus, compatible with recently demonstrated dural venous thrombosis. 2. No acute ischemia or hemorrhage. 3. Normal MRA of the head and neck. Electronically Signed   By: Ulyses Jarred M.D.   On: 10/29/2020 02:58   MR BRAIN WO CONTRAST  Result Date: 10/29/2020 CLINICAL DATA:  Headache EXAM: MRI HEAD WITHOUT  CONTRAST MRA HEAD WITHOUT CONTRAST MRA NECK WITHOUT CONTRAST TECHNIQUE: Multiplanar, multiecho pulse sequences of the brain and surrounding structures were obtained without intravenous contrast. Angiographic images of the Circle of Willis were obtained using MRA technique without intravenous contrast. Angiographic images of the neck were obtained using MRA technique without intravenous contrast. Carotid stenosis measurements (when applicable) are obtained utilizing NASCET criteria, using the distal internal carotid diameter as the denominator. COMPARISON:  MRV brain 10/28/2020 FINDINGS: MRI HEAD FINDINGS Brain: No acute infarct, mass effect or extra-axial collection. No acute or chronic hemorrhage. Normal white matter signal, parenchymal volume and CSF spaces. The midline structures are normal. Vascular: Abnormal signal within the superior sagittal sinus, left transverse sinus, left sigmoid sinus, compatible with recently demonstrated dural venous thrombosis. Skull and upper cervical spine: Normal calvarium and skull base. Visualized upper cervical spine and soft tissues are normal. Sinuses/Orbits:No paranasal sinus fluid levels or advanced mucosal thickening. No mastoid or middle ear effusion. Normal orbits. MRA HEAD FINDINGS POSTERIOR CIRCULATION: --Vertebral arteries: Normal --Inferior cerebellar arteries: Normal. --Basilar artery: Normal. --Superior cerebellar arteries: Normal. --Posterior cerebral arteries: Normal. ANTERIOR CIRCULATION: --Intracranial internal carotid arteries: Normal. --Anterior cerebral arteries (ACA): Normal. --Middle cerebral arteries (MCA): Normal. ANATOMIC VARIANTS: Fetal  origin of the right PCA. MRA NECK FINDINGS Normal 3 vessel branching pattern of the aortic arch. Vertebral arteries are codominant. Both vertebral arteries are normal from their origin to the skull base. There is no carotid stenosis or occlusion. IMPRESSION: 1. Abnormal signal within the superior sagittal sinus, left transverse sinus, left sigmoid sinus, compatible with recently demonstrated dural venous thrombosis. 2. No acute ischemia or hemorrhage. 3. Normal MRA of the head and neck. Electronically Signed   By: Ulyses Jarred M.D.   On: 10/29/2020 02:58   MR MRV HEAD W WO CONTRAST  Result Date: 10/28/2020 CLINICAL DATA:  Headache, near syncope EXAM: MR VENOGRAM HEAD WITHOUT AND WITH CONTRAST TECHNIQUE: Angiographic images of the intracranial venous structures were obtained using MRV technique without and with intravenous contrast. CONTRAST:  9mL GADAVIST GADOBUTROL 1 MMOL/ML IV SOLN COMPARISON:  None. FINDINGS: There is nonocclusive thrombus extending from the mid superior sagittal sinus posteriorly. A sliver extends posteriorly pre remainder of the superior sagittal sinus sinus is patent. Straight sinus, vein of Galen, and internal cerebral veins are patent. Right transverse and sigmoid sinuses are patent. Dominant left transverse sinus is patent. There is absent enhancement of the left sigmoid sinus and visualized left internal jugular vein. There is no abnormal intracranial enhancement. 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 Thrombus within the left sigmoid sinus and visualized left internal jugular vein. These results were called by telephone at the time of interpretation on 10/28/2020 at 5:00 pm to provider Dr. Almyra Free, Who verbally acknowledged these results. Electronically Signed   By: Macy Mis M.D.   On: 10/28/2020 17:03   ECHOCARDIOGRAM COMPLETE  Result Date: 10/29/2020    ECHOCARDIOGRAM REPORT   Patient Name:   JACQLINE KUBOTA Buzzelli  Date of Exam: 10/29/2020 Medical Rec #:  [id number]            Height:       62.0 in Accession #:    [id number]           Weight:       132.0 lb Date of Birth:  November 07, 1965           BSA:          1.602 m Patient Age:    87  years             BP:           131/72 mmHg Patient Gender: F                    HR:           47 bpm. Exam Location:  Inpatient Procedure: 2D Echo, Color Doppler and Cardiac Doppler Indications:    Stroke 434.91 / I163.9  History:        Patient has no prior history of Echocardiogram examinations.  Sonographer:    Bernadene Person RDCS Referring Phys: [id number] [place]  1. Left ventricular ejection fraction, by estimation, is 55 to 60%. The left ventricle has normal function. The left ventricle has no regional wall motion abnormalities. Left ventricular diastolic parameters were normal.  2. Right ventricular systolic function is normal. The right ventricular size is normal. There is normal pulmonary artery systolic pressure. The estimated right ventricular systolic pressure is 123XX123 mmHg.  3. The mitral valve is normal in structure. No evidence of mitral valve regurgitation. No evidence of mitral stenosis.  4. The aortic valve is tricuspid. Aortic valve regurgitation is not visualized. No aortic stenosis is present.  5. The inferior vena cava is normal in size with greater than 50% respiratory variability, suggesting right atrial pressure of 3 mmHg. FINDINGS  Left Ventricle: Left ventricular ejection fraction, by estimation, is 55 to 60%. The left ventricle has normal function. The left ventricle has no regional wall motion abnormalities. The left ventricular internal cavity size was normal in size. There is  no left ventricular hypertrophy. Left ventricular diastolic parameters were normal. Right Ventricle: The right ventricular size is normal. No increase in right ventricular wall thickness. Right ventricular systolic function is normal. There is normal pulmonary artery systolic  pressure. The tricuspid regurgitant velocity is 2.69 m/s, and  with an assumed right atrial pressure of 3 mmHg, the estimated right ventricular systolic pressure is 123XX123 mmHg. Left Atrium: Left atrial size was normal in size. Right Atrium: Right atrial size was normal in size. Pericardium: There is no evidence of pericardial effusion. Mitral Valve: The mitral valve is normal in structure. No evidence of mitral valve regurgitation. No evidence of mitral valve stenosis. Tricuspid Valve: The tricuspid valve is normal in structure. Tricuspid valve regurgitation is trivial. Aortic Valve: The aortic valve is tricuspid. Aortic valve regurgitation is not visualized. No aortic stenosis is present. Pulmonic Valve: The pulmonic valve was normal in structure. Pulmonic valve regurgitation is not visualized. Aorta: The aortic root is normal in size and structure. Venous: The inferior vena cava is normal in size with greater than 50% respiratory variability, suggesting right atrial pressure of 3 mmHg. IAS/Shunts: No atrial level shunt detected by color flow Doppler.  LEFT VENTRICLE PLAX 2D LVIDd:         3.90 cm  Diastology LVIDs:         2.70 cm  LV e' medial:    11.60 cm/s LV PW:         1.20 cm  LV E/e' medial:  8.1 LV IVS:        0.70 cm  LV e' lateral:   12.70 cm/s LVOT diam:     1.90 cm  LV E/e' lateral: 7.4 LV SV:         81 LV SV Index:   50 LVOT Area:     2.84 cm  RIGHT VENTRICLE RV S prime:  13.10 cm/s TAPSE (M-mode): 2.8 cm LEFT ATRIUM             Index       RIGHT ATRIUM           Index LA diam:        2.40 cm 1.50 cm/m  RA Area:     11.30 cm LA Vol (A2C):   44.0 ml 27.46 ml/m RA Volume:   20.10 ml  12.55 ml/m LA Vol (A4C):   48.3 ml 30.15 ml/m LA Biplane Vol: 46.7 ml 29.15 ml/m  AORTIC VALVE LVOT Vmax:   117.00 cm/s LVOT Vmean:  80.300 cm/s LVOT VTI:    0.285 m  AORTA Ao Root diam: 2.90 cm Ao Asc diam:  2.60 cm MITRAL VALVE               TRICUSPID VALVE MV Area (PHT): 3.77 cm    TR Peak grad:   28.9 mmHg MV  Decel Time: 201 msec    TR Vmax:        269.00 cm/s MV E velocity: 93.60 cm/s MV A velocity: 63.50 cm/s  SHUNTS MV E/A ratio:  1.47        Systemic VTI:  0.29 m                            Systemic Diam: 1.90 cm Loralie Champagne MD Electronically signed by Loralie Champagne MD Signature Date/Time: 10/29/2020/12:53:25 PM    Final    CT MAXILLOFACIAL WO CONTRAST  Result Date: 10/28/2020 CLINICAL DATA:  Maxillofacial pain. Headache. 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 EXAM: CT MAXILLOFACIAL WITHOUT CONTRAST TECHNIQUE: Multidetector CT imaging of the maxillofacial structures was performed. Multiplanar CT image reconstructions were also generated. COMPARISON:  CT head 10/28/2020.  MR venogram 10/28/2020 FINDINGS: Osseous: Negative for fracture. No bone lesion identified. No acute dental infection Orbits: Normal orbit. No mass or edema in the orbit. Normal bony orbit. Sinuses: Frontal sinus extensive mucosal edema left clear. Frontal sinus. Right remaining paranasal sinuses clear. Mastoid sinus clear bilaterally. Soft tissues: No soft tissue mass or adenopathy.  Normal pharynx The left jugular vein is distended and ill-defined suggesting left jugular vein thrombosis. There is filling defect in the sigmoid sinus and proximal left jugular vein on MRV of the head today. Limited intracranial: Negative IMPRESSION: 1. Findings consistent with thrombus in the left jugular vein. This corresponds with filling defect in the left sigmoid sinus and jugular vein on MR venogram from today. 2. Mucosal edema left frontal sinus.  Remaining sinuses clear. Electronically Signed   By: Franchot Gallo M.D.   On: 10/28/2020 19:43   Scheduled Meds: Continuous Infusions: . ampicillin-sulbactam (UNASYN) IV 3 g (10/29/20 1459)  . heparin 850 Units/hr (10/28/20 2014)    LOS: 1 day   Kerney Elbe, DO [facility] PAGER is on [place]  If 7PM-7AM, please contact  night-coverage [email or web address]

## 2020-10-29 NOTE — ED Notes (Signed)
Hospital Bed Ordered @ 0946-per Percival Spanish, RN ordered by Marylene Land

## 2020-10-29 NOTE — ED Notes (Signed)
Patient transported to MRI 

## 2020-10-29 NOTE — Progress Notes (Signed)
ANTICOAGULATION CONSULT NOTE  Pharmacy Consult:  Heparin Indication:  Venous sinus thrombus   Allergies  Allergen Reactions  . Doxycycline Nausea And Vomiting    Patient Measurements: Height: 5\' 2"  (157.5 cm) Weight: 59.9 kg (132 lb) IBW/kg (Calculated) : 50.1 Heparin Dosing Weight: 59 kg  Vital Signs: Temp: 98.2 F (36.8 C) (12/31 1241) Temp Source: Oral (12/31 1241) BP: 136/70 (12/31 1215) Pulse Rate: 68 (12/31 1215)  Labs: Recent Labs    10/28/20 0818 10/29/20 0236 10/29/20 1200  HGB 11.8* 10.3*  --   HCT 35.7* 32.7*  --   PLT 102* 81*  --   HEPARINUNFRC  --  0.30 0.34  CREATININE 1.37* 1.24*  --     Estimated Creatinine Clearance: 42 mL/min (A) (by C-G formula based on SCr of 1.24 mg/dL (H)).   Assessment: Sydney [name] presented with a headache, found to have venous sinus thrombosis.  Pharmacy consulted to manage IV heparin.  Confirmatory heparin level is therapeutic.  Platelet count trending down; no bleeding reported.  Goal of Therapy:  Heparin level 0.3-0.5 units/ml Monitor platelets by anticoagulation protocol: Yes   Plan:  Continue heparin gtt at 850 units/hr, no bolus per Neuro Daily heparin level and CBC Watch plts closely  Aleksandra Raben D. [birth year]-06-1980, PharmD, BCPS, BCCCP 10/29/2020, 1:03 PM

## 2020-10-29 NOTE — Progress Notes (Signed)
Antibiotic/ANTICOAGULATION CONSULT NOTE  Pharmacy Consult for Heparin Indication: Venous sinus thrombus   Allergies  Allergen Reactions  . Doxycycline Nausea And Vomiting    Patient Measurements: Height: 5\' 2"  (157.5 cm) Weight: 59.9 kg (132 lb) IBW/kg (Calculated) : 50.1 Heparin Dosing Weight: 59.9kg  Vital Signs: Temp: 98.4 F (36.9 C) (12/31 0245) Temp Source: Oral (12/31 0245) BP: 125/71 (12/31 0300) Pulse Rate: 41 (12/31 0300)  Labs: Recent Labs    10/28/20 0818 10/29/20 0236  HGB 11.8* 10.3*  HCT 35.7* 32.7*  PLT 102* 81*  HEPARINUNFRC  --  0.30  CREATININE 1.37*  --     Estimated Creatinine Clearance: 38 mL/min (A) (by C-G formula based on SCr of 1.37 mg/dL (H)).   Medical History: Past Medical History:  Diagnosis Date  . Anemia   . History of cold sores   . HPV (human papilloma virus) infection   . Post-operative nausea and vomiting     Medications:  Scheduled:    Assessment: Patient is a 63 yof that is being admitted with a central venous thrombus. Pharmacy has been asked to dose heparin with no bolus at this time.   12/31 AM update:  Heparin level therapeutic  Hgb 10.3  Goal of Therapy:  Heparin level 0.3-0.5 units/ml Monitor platelets by anticoagulation protocol: Yes   Plan:  Cont heparin 850 units/hr 1200 heparin level  1/32, PharmD, BCPS Clinical Pharmacist Phone: [phone]

## 2020-10-29 NOTE — ED Notes (Signed)
Placed pt on hospital bed

## 2020-10-29 NOTE — Progress Notes (Signed)
  Echocardiogram 2D Echocardiogram has been performed.  Sydney [name] 10/29/2020, 11:18 AM

## 2020-10-29 NOTE — ED Notes (Signed)
Attempted to give report and was told the charge nurse still has to approve pt.

## 2020-10-29 NOTE — ED Notes (Signed)
Patient returned from MRI. Labs drawn and sent to lab. Medicated as ordered. Cardiac, bp and pulse ox monitoring in place. Call bell in reach. Will continue to monitor.

## 2020-10-30 DIAGNOSIS — D649 Anemia, unspecified: Secondary | ICD-10-CM | POA: Diagnosis not present

## 2020-10-30 DIAGNOSIS — E785 Hyperlipidemia, unspecified: Secondary | ICD-10-CM

## 2020-10-30 DIAGNOSIS — N1831 Chronic kidney disease, stage 3a: Secondary | ICD-10-CM | POA: Diagnosis not present

## 2020-10-30 DIAGNOSIS — D696 Thrombocytopenia, unspecified: Secondary | ICD-10-CM | POA: Diagnosis not present

## 2020-10-30 DIAGNOSIS — G08 Intracranial and intraspinal phlebitis and thrombophlebitis: Secondary | ICD-10-CM | POA: Diagnosis not present

## 2020-10-30 LAB — CBC WITH DIFFERENTIAL/PLATELET
Abs Immature Granulocytes: 0.03 10*3/uL (ref 0.00–0.07)
Basophils Absolute: 0 10*3/uL (ref 0.0–0.1)
Basophils Relative: 1 %
Eosinophils Absolute: 0 10*3/uL (ref 0.0–0.5)
Eosinophils Relative: 0 %
HCT: 31.8 % — ABNORMAL LOW (ref 36.0–46.0)
Hemoglobin: 10.2 g/dL — ABNORMAL LOW (ref 12.0–15.0)
Immature Granulocytes: 0 %
Lymphocytes Relative: 21 %
Lymphs Abs: 1.6 10*3/uL (ref 0.7–4.0)
MCH: 28.6 pg (ref 26.0–34.0)
MCHC: 32.1 g/dL (ref 30.0–36.0)
MCV: 89.1 fL (ref 80.0–100.0)
Monocytes Absolute: 0.5 10*3/uL (ref 0.1–1.0)
Monocytes Relative: 6 %
Neutro Abs: 5.5 10*3/uL (ref 1.7–7.7)
Neutrophils Relative %: 72 %
Platelets: 83 10*3/uL — ABNORMAL LOW (ref 150–400)
RBC: 3.57 MIL/uL — ABNORMAL LOW (ref 3.87–5.11)
RDW: 13.9 % (ref 11.5–15.5)
WBC: 7.6 10*3/uL (ref 4.0–10.5)
nRBC: 0 % (ref 0.0–0.2)

## 2020-10-30 LAB — COMPREHENSIVE METABOLIC PANEL
ALT: 10 U/L (ref 0–44)
AST: 16 U/L (ref 15–41)
Albumin: 2.6 g/dL — ABNORMAL LOW (ref 3.5–5.0)
Alkaline Phosphatase: 33 U/L — ABNORMAL LOW (ref 38–126)
Anion gap: 6 (ref 5–15)
BUN: 12 mg/dL (ref 6–20)
CO2: 27 mmol/L (ref 22–32)
Calcium: 8.2 mg/dL — ABNORMAL LOW (ref 8.9–10.3)
Chloride: 103 mmol/L (ref 98–111)
Creatinine, Ser: 1.23 mg/dL — ABNORMAL HIGH (ref 0.44–1.00)
GFR, Estimated: 53 mL/min — ABNORMAL LOW (ref >60)
Glucose, Bld: 100 mg/dL — ABNORMAL HIGH (ref 70–99)
Potassium: 3.8 mmol/L (ref 3.5–5.1)
Sodium: 136 mmol/L (ref 135–145)
Total Bilirubin: 0.7 mg/dL (ref 0.3–1.2)
Total Protein: 6 g/dL — ABNORMAL LOW (ref 6.5–8.1)

## 2020-10-30 LAB — PHOSPHORUS: Phosphorus: 2.6 mg/dL (ref 2.5–4.6)

## 2020-10-30 LAB — HOMOCYSTEINE: Homocysteine: 9.1 umol/L (ref 0.0–14.5)

## 2020-10-30 LAB — HEPARIN LEVEL (UNFRACTIONATED): Heparin Unfractionated: 0.3 IU/mL (ref 0.30–0.70)

## 2020-10-30 LAB — MAGNESIUM: Magnesium: 2 mg/dL (ref 1.7–2.4)

## 2020-10-30 MED ORDER — METOCLOPRAMIDE HCL 5 MG/ML IJ SOLN
10.0000 mg | Freq: Once | INTRAMUSCULAR | Status: AC
  Administered 2020-10-30: 10 mg via INTRAVENOUS
  Filled 2020-10-30: qty 2

## 2020-10-30 MED ORDER — DIPHENHYDRAMINE HCL 50 MG/ML IJ SOLN
25.0000 mg | Freq: Once | INTRAMUSCULAR | Status: AC
  Administered 2020-10-30: 25 mg via INTRAVENOUS
  Filled 2020-10-30: qty 1

## 2020-10-30 NOTE — Progress Notes (Addendum)
PROGRESS NOTE    Sydney [name]  [id number] DOB: Feb 05, 1966 DOA: 10/28/2020 PCP: Sydney Hillock, DO   Brief Narrative:  HPI per Dr. Domenic [name] on 10/28/20 Sydney [name] is a 55 y.o. female with medical history significant for mild kidney disease, rhabdomyolysis few years ago but no other significant medical history presented to the ED today with multiple complaints. -Over the past 3 weeks she has been treated by her PCP with multiple courses of antibiotics (amoxicillin followed by cefdinir )for sinus infections which progressed to middle ear infections, she also had a course of prednisone.  Her ears and sinuses started improving however developed pain and swelling in the left side of her neck associated with headaches over the last few days.  Describes the headache as throbbing, right behind her years and close to her left ear as well.  -Today when she was walking started developing nausea and some dizziness, and a few episodes of vomiting and felt like she was about to pass out.  She denies any loss of consciousness. -Denies any fevers or chills, denies any symptoms of Covid, she has received 2 Pfizer shots in January and February followed by booster in November. ED Course: Vital signs were unremarkable, labs were notable for potassium 3.1, creatinine of 1.3, white count of 10.9, Covid PCR and urine hCG were negative.  She had an MRV done which was notable for Nonocclusive thrombus within the mid superior sagittal sinus extending posteriorly. Thrombus within the left sigmoid sinus and visualized left internal jugular vein  **Interim History Neuorlogy was consulted and patient was started on IV heparin for her cerebral venous sinus thrombosis.  Neurology also recommended hypercoagulation labs and an MRI of the brain to evaluate for underlying venous infarcts as well as a stat head CT if she had a change in neurological status. MRI was done and is below. Patient continued to  have some nausea vomiting that was not amenable to ondansetron so she was started on Compazine.  Neurology feels her thrombus could be related to estrogen use.  They are recommending continuing heparin drip for few days and if she improves and has no bleeding then can safely change to Eliquis or Pradaxa.  Assessment & Plan:   Principal Problem:   Cerebral venous sinus thrombosis Active Problems:   CKD (chronic kidney disease) stage 3, GFR 30-59 ml/min (HCC)   Cerebral venous sinus thrombosis, acute  Cerebral venous sinus thrombosis with presentation of headache possibly due to estrogen usage -Head CT done and showed " -CT Maxillofacial done and showed "Findings consistent with thrombus in the left jugular vein. This corresponds with filling defect in the left sigmoid sinus and jugular vein on MR venogram from today. 2. Mucosal edema left frontal sinus.  Remaining sinuses clear."   -Etiologies could be potentially infectious, recent long course of ear infections/ ? Chronic otitis media potentialy affecting the dural sinus, will check CT head and neck, add Empiric Unasyn pending further imaging -Neurology feels that it could be related to her estrogen usage -Hormonal oral contraceptive pills (Junel 1/20 1-20) is an important risk factor, she has been on this since August -Neurology consulted and recommended IV heparin for now -Will also order hypercoagulable panel -Per Neuro- start IV Heparin and continue for a few days and transition to Pradaxa or Eliquis if tolerates heparin drip -MRI done of the head, the neck as well as the brain that showed no acute infarct, mass-effect or extra axial collection no acute or chronic  hemorrhage.  There was normal white matter signal and parenchymal volume and CFS spaces.  Overall impression was that there is an abnormal signal within the superior sagittal sinus left transverse sinus, left sigmoid sinus, compatible recently demonstrated dural venous thrombosis and  no acute ischemia or hemorrhage.  Normal MRA of the head neck -He is admitted to the progressive care unit and currently neurology recommending at least 2 days of heparin and switching to DOAC either Eliquis or Pradaxa  -Her hypercoagulable panel is pending still but her Antithrombin activity was 107 -We will obtain PT OT to further evaluate and treat  Nausea and vomiting setting of headache -Likely related to above -Continue with antiemetics and supportive care -Continue with Ondansetron 4 mg po/IV q6hprn and also now has as needed Proclorperazine 10 mg IV q6hprn -Her nausea vomiting is improved but she is not very hungry so we will obtain a nutritionist consult   AKi on CKD (chronic kidney disease) stage 3a -Prior history of renal insufficiency in the setting of rhabdomyolysis, baseline creatinine was 1.1 in November -Creatinine is 1.3 today, continue gentle hydration overnight and her BUN/creatinine is now 50/1.24 today it is 12/1.23 -Aoid nephrotoxic medications, hypotension, contrast dyes as well as renally dose medications -Repeat CMP in a.m. and continue monitor renal function  Leukocytosis  -Improved and likely in the above setting -Patient's WBC went from 10.9 -> 8.9 -> 7.6 -continue monitor for signs and symptoms infection -repeat CBC in a.m.  Normocytic anemia -Patient's hemoglobin/hematocrit went from 11.8/35.7 -> 10.3/32.7 -> 10.2/31.8 -Check Anemia panel in the a.m. -Continue to monitor for signs and symptoms of bleeding she is anticoagulated on a heparin drip; currently no overt bleeding noted -repeat CBC in a.m.  Thrombocytopenia -patient platelet count of 102 on admission is now 13 today -continue to monitor for signs and symptoms of bleeding; currently no overt bleeding -repeat CBC in a.m.  HLD  -Lipid Panel done on 08/27/20 showed Cholesterol Level of 239, HDL of 79, LDL 143, TG of 73,  -She was started on Atorvastatin 40 mg po Daily and will continue at  D/C  Sinus and Middle Ear Infections -Has been on Multiple Abx over the coarse of the last 3 weeks; Amoxicillin followed by Cefdinir and also had a course of Prednisone -Developed pain and swelling in the Left side of her Neck associated with Headaches over the last few days -C/w Unasyn 3 grams q6h  Hypokalemia -This was replete and potassium went from 3.1 is now 4.1 yesterday and is 3.8 today -Continue to monitor and replete as necessary -Repeat CMP in a.m.  DVT prophylaxis: Anticoagulated with a heparin drip and will transition to [place] in the next Day or so Code Status: FULL CODE Family Communication: No family present at bedside Disposition Plan: Pending further neurological clearance and evaluation.  Will need PT OT to evaluate  Status is: Inpatient  Remains inpatient appropriate because:Unsafe d/c plan, IV treatments appropriate due to intensity of illness or inability to take PO and Inpatient level of care appropriate due to severity of illness   Dispo: The patient is from: Home              Anticipated d/c is to: TBD              Anticipated d/c date is: 2 days              Patient currently is not medically stable to d/c.   Consultants:   Neurology   Procedures:  ECHOCARDIOGRAM IMPRESSIONS    1. Left ventricular ejection fraction, by estimation, is 55 to 60%. The  left ventricle has normal function. The left ventricle has no regional  wall motion abnormalities. Left ventricular diastolic parameters were  normal.  2. Right ventricular systolic function is normal. The right ventricular  size is normal. There is normal pulmonary artery systolic pressure. The  estimated right ventricular systolic pressure is 123XX123 mmHg.  3. The mitral valve is normal in structure. No evidence of mitral valve  regurgitation. No evidence of mitral stenosis.  4. The aortic valve is tricuspid. Aortic valve regurgitation is not  visualized. No aortic stenosis is present.  5. The  inferior vena cava is normal in size with greater than 50%  respiratory variability, suggesting right atrial pressure of 3 mmHg.   FINDINGS  Left Ventricle: Left ventricular ejection fraction, by estimation, is 55  to 60%. The left ventricle has normal function. The left ventricle has no  regional wall motion abnormalities. The left ventricular internal cavity  size was normal in size. There is  no left ventricular hypertrophy. Left ventricular diastolic parameters  were normal.   Right Ventricle: The right ventricular size is normal. No increase in  right ventricular wall thickness. Right ventricular systolic function is  normal. There is normal pulmonary artery systolic pressure. The tricuspid  regurgitant velocity is 2.69 m/s, and  with an assumed right atrial pressure of 3 mmHg, the estimated right  ventricular systolic pressure is 123XX123 mmHg.   Left Atrium: Left atrial size was normal in size.   Right Atrium: Right atrial size was normal in size.   Pericardium: There is no evidence of pericardial effusion.   Mitral Valve: The mitral valve is normal in structure. No evidence of  mitral valve regurgitation. No evidence of mitral valve stenosis.   Tricuspid Valve: The tricuspid valve is normal in structure. Tricuspid  valve regurgitation is trivial.   Aortic Valve: The aortic valve is tricuspid. Aortic valve regurgitation is  not visualized. No aortic stenosis is present.   Pulmonic Valve: The pulmonic valve was normal in structure. Pulmonic valve  regurgitation is not visualized.   Aorta: The aortic root is normal in size and structure.   Venous: The inferior vena cava is normal in size with greater than 50%  respiratory variability, suggesting right atrial pressure of 3 mmHg.   IAS/Shunts: No atrial level shunt detected by color flow Doppler.     LEFT VENTRICLE  PLAX 2D  LVIDd:     3.90 cm Diastology  LVIDs:     2.70 cm LV e' medial:  11.60 cm/s  LV  PW:     1.20 cm LV E/e' medial: 8.1  LV IVS:    0.70 cm LV e' lateral:  12.70 cm/s  LVOT diam:   1.90 cm LV E/e' lateral: 7.4  LV SV:     81  LV SV Index:  50  LVOT Area:   2.84 cm     RIGHT VENTRICLE  RV S prime:   13.10 cm/s  TAPSE (M-mode): 2.8 cm   LEFT ATRIUM       Index    RIGHT ATRIUM      Index  LA diam:    2.40 cm 1.50 cm/m RA Area:   11.30 cm  LA Vol (A2C):  44.0 ml 27.46 ml/m RA Volume:  20.10 ml 12.55 ml/m  LA Vol (A4C):  48.3 ml 30.15 ml/m  LA Biplane Vol: 46.7 ml 29.15 ml/m  AORTIC VALVE  LVOT Vmax:  117.00 cm/s  LVOT Vmean: 80.300 cm/s  LVOT VTI:  0.285 m    AORTA  Ao Root diam: 2.90 cm  Ao Asc diam: 2.60 cm   MITRAL VALVE        TRICUSPID VALVE  MV Area (PHT): 3.77 cm  TR Peak grad:  28.9 mmHg  MV Decel Time: 201 msec  TR Vmax:    269.00 cm/s  MV E velocity: 93.60 cm/s  MV A velocity: 63.50 cm/s SHUNTS  MV E/A ratio: 1.47    Systemic VTI: 0.29 m               Systemic Diam: 1.90 cm   Antimicrobials:  Anti-infectives (From admission, onward)   Start     Dose/Rate Route Frequency Ordered Stop   10/28/20 1900  Ampicillin-Sulbactam (UNASYN) 3 g in sodium chloride 0.9 % 100 mL IVPB        3 g 200 mL/hr over 30 Minutes Intravenous Every 6 hours 10/28/20 1848          Subjective: Seen and examined at bedside and and feeling a little bit better and stated that her headache was still there but not as bad.  States her pain was a 5 out of 10.  Still not very hungry but no longer nauseous.  Denies any chest pain, lightheadedness or dizziness.  No other concerns or complaints at this time.   Objective: Vitals:   10/30/20 0047 10/30/20 0612 10/30/20 0626 10/30/20 0842  BP: (!) 155/70 135/76 110/81 (!) 162/60  Pulse: 78 85 (!) 47 (!) 50  Resp: 20 20 20 19   Temp: 98 F (36.7 C) (!) 97.3 F (36.3 C)  98 F (36.7 C)  TempSrc: Oral Oral    SpO2: 99% 97%     Weight:      Height:        Intake/Output Summary (Last 24 hours) at 10/30/2020 0930 Last data filed at 10/30/2020 12/28/2020 Gross per 24 hour  Intake 1302.55 ml  Output -  Net 1302.55 ml   Filed Weights   10/28/20 0816  Weight: 59.9 kg   Examination: Physical Exam:  Constitutional: The patient is a thin Caucasian female currently who appears calm and more comfortable today than she did yesterday not as nauseous Eyes: Lids and conjunctivae normal, sclerae anicteric  ENMT: External Ears, Nose appear normal. Grossly normal hearing.  Neck: Appears normal, supple, no cervical masses, normal ROM, no appreciable thyromegaly,: No JVD Respiratory: Diminished to auscultation bilaterally, no wheezing, rales, rhonchi or crackles. Normal respiratory effort and patient is not tachypenic. No accessory muscle use.  Unlabored breathing Cardiovascular: Slightly bradycardic rate but regular rhythm., no murmurs / rubs / gallops. S1 and S2 auscultated. No extremity edema.  Abdomen: Soft, non-tender, non-distended. Bowel sounds positive.  GU: Deferred. Musculoskeletal: No clubbing / cyanosis of digits/nails. No joint deformity upper and lower extremities.  Skin: No rashes, lesions, ulcers on limited skin evaluation. No induration; Warm and dry.  Neurologic: CN 2-12 grossly intact with no focal deficits. Romberg sign and cerebellar reflexes not assessed.  Psychiatric: Normal judgment and insight. Alert and oriented x 3. Normal mood and appropriate affect.    Data Reviewed: I have personally reviewed following labs and imaging studies  CBC: Recent Labs  Lab 10/28/20 0818 10/29/20 0236 10/30/20 0123  WBC 10.9* 8.9 7.6  NEUTROABS  --   --  5.5  HGB 11.8* 10.3* 10.2*  HCT 35.7* 32.7* 31.8*  MCV 90.6 90.6  89.1  PLT 102* 81* 83*   Basic Metabolic Panel: Recent Labs  Lab 10/28/20 0818 10/29/20 0236 10/30/20 0123  NA 136 137 136  K 3.1* 4.1 3.8  CL 104 104 103  CO2 21* 23 27  GLUCOSE 125* 99 100*   BUN 14 15 12   CREATININE 1.37* 1.24* 1.23*  CALCIUM 8.9 8.5* 8.2*  MG  --   --  2.0  PHOS  --   --  2.6   GFR: Estimated Creatinine Clearance: 42.3 mL/min (A) (by C-G formula based on SCr of 1.23 mg/dL (H)). Liver Function Tests: Recent Labs  Lab 10/29/20 0236 10/30/20 0123  AST 19 16  ALT 12 10  ALKPHOS 33* 33*  BILITOT 0.7 0.7  PROT 6.5 6.0*  ALBUMIN 2.8* 2.6*   No results for input(s): LIPASE, AMYLASE in the last 168 hours. No results for input(s): AMMONIA in the last 168 hours. Coagulation Profile: No results for input(s): INR, PROTIME in the last 168 hours. Cardiac Enzymes: No results for input(s): CKTOTAL, CKMB, CKMBINDEX, TROPONINI in the last 168 hours. BNP (last 3 results) No results for input(s): PROBNP in the last 8760 hours. HbA1C: No results for input(s): HGBA1C in the last 72 hours. CBG: Recent Labs  Lab 10/28/20 0823  GLUCAP 120*   Lipid Profile: No results for input(s): CHOL, HDL, LDLCALC, TRIG, CHOLHDL, LDLDIRECT in the last 72 hours. Thyroid Function Tests: No results for input(s): TSH, T4TOTAL, FREET4, T3FREE, THYROIDAB in the last 72 hours. Anemia Panel: No results for input(s): VITAMINB12, FOLATE, FERRITIN, TIBC, IRON, RETICCTPCT in the last 72 hours. Sepsis Labs: No results for input(s): PROCALCITON, LATICACIDVEN in the last 168 hours.  Recent Results (from the past 240 hour(s))  Resp Panel by RT-PCR (Flu A&B, Covid) Nasopharyngeal Swab     Status: None   Collection Time: 10/28/20 10:46 AM   Specimen: Nasopharyngeal Swab; Nasopharyngeal(NP) swabs in vial transport medium  Result Value Ref Range Status   SARS Coronavirus 2 by RT PCR NEGATIVE NEGATIVE Final    Comment: (NOTE) SARS-CoV-2 target nucleic acids are NOT DETECTED.  The SARS-CoV-2 RNA is generally detectable in upper respiratory specimens during the acute phase of infection. The lowest concentration of SARS-CoV-2 viral copies this assay can detect is 138 copies/mL. A negative  result does not preclude SARS-Cov-2 infection and should not be used as the sole basis for treatment or other patient management decisions. A negative result may occur with  improper specimen collection/handling, submission of specimen other than nasopharyngeal swab, presence of viral mutation(s) within the areas targeted by this assay, and inadequate number of viral copies(<138 copies/mL). A negative result must be combined with clinical observations, patient history, and epidemiological information. The expected result is Negative.  Fact Sheet for Patients:  EntrepreneurPulse.com.au  Fact Sheet for Healthcare Providers:  IncredibleEmployment.be  This test is no t yet approved or cleared by the [place] FDA and  has been authorized for detection and/or diagnosis of SARS-CoV-2 by FDA under an Emergency Use Authorization (EUA). This EUA will remain  in effect (meaning this test can be used) for the duration of the COVID-19 declaration under Section 564(b)(1) of the Act, 21 U.S.C.section 360bbb-3(b)(1), unless the authorization is terminated  or revoked sooner.       Influenza A by PCR NEGATIVE NEGATIVE Final   Influenza B by PCR NEGATIVE NEGATIVE Final    Comment: (NOTE) The Xpert Xpress SARS-CoV-2/FLU/RSV plus assay is intended as an aid in the diagnosis of influenza from Nasopharyngeal swab  specimens and should not be used as a sole basis for treatment. Nasal washings and aspirates are unacceptable for Xpert Xpress SARS-CoV-2/FLU/RSV testing.  Fact Sheet for Patients: EntrepreneurPulse.com.au  Fact Sheet for Healthcare Providers: IncredibleEmployment.be  This test is not yet approved or cleared by the [place] FDA and has been authorized for detection and/or diagnosis of SARS-CoV-2 by FDA under an Emergency Use Authorization (EUA). This EUA will remain in effect (meaning this test can be used)  for the duration of the COVID-19 declaration under Section 564(b)(1) of the Act, 21 U.S.C. section 360bbb-3(b)(1), unless the authorization is terminated or revoked.  Performed at [facility], [place] [address] [place], [place] [address]      RN Pressure Injury Documentation:     Estimated body mass index is 24.14 kg/m as calculated from the following:   Height as of this encounter: 5\' 2"  (1.575 m).   Weight as of this encounter: 59.9 kg.  Malnutrition Type:   Malnutrition Characteristics:   Nutrition Interventions:     Radiology Studies: MR ANGIO HEAD WO CONTRAST  Result Date: 10/29/2020 CLINICAL DATA:  Headache EXAM: MRI HEAD WITHOUT CONTRAST MRA HEAD WITHOUT CONTRAST MRA NECK WITHOUT CONTRAST TECHNIQUE: Multiplanar, multiecho pulse sequences of the brain and surrounding structures were obtained without intravenous contrast. Angiographic images of the Circle of Willis were obtained using MRA technique without intravenous contrast. Angiographic images of the neck were obtained using MRA technique without intravenous contrast. Carotid stenosis measurements (when applicable) are obtained utilizing NASCET criteria, using the distal internal carotid diameter as the denominator. COMPARISON:  MRV brain 10/28/2020 FINDINGS: MRI HEAD FINDINGS Brain: No acute infarct, mass effect or extra-axial collection. No acute or chronic hemorrhage. Normal white matter signal, parenchymal volume and CSF spaces. The midline structures are normal. Vascular: Abnormal signal within the superior sagittal sinus, left transverse sinus, left sigmoid sinus, compatible with recently demonstrated dural venous thrombosis. Skull and upper cervical spine: Normal calvarium and skull base. Visualized upper cervical spine and soft tissues are normal. Sinuses/Orbits:No paranasal sinus fluid levels or advanced mucosal thickening. No mastoid or middle ear effusion. Normal orbits. MRA HEAD FINDINGS POSTERIOR CIRCULATION:  --Vertebral arteries: Normal --Inferior cerebellar arteries: Normal. --Basilar artery: Normal. --Superior cerebellar arteries: Normal. --Posterior cerebral arteries: Normal. ANTERIOR CIRCULATION: --Intracranial internal carotid arteries: Normal. --Anterior cerebral arteries (ACA): Normal. --Middle cerebral arteries (MCA): Normal. ANATOMIC VARIANTS: Fetal origin of the right PCA. MRA NECK FINDINGS Normal 3 vessel branching pattern of the aortic arch. Vertebral arteries are codominant. Both vertebral arteries are normal from their origin to the skull base. There is no carotid stenosis or occlusion. IMPRESSION: 1. Abnormal signal within the superior sagittal sinus, left transverse sinus, left sigmoid sinus, compatible with recently demonstrated dural venous thrombosis. 2. No acute ischemia or hemorrhage. 3. Normal MRA of the head and neck. Electronically Signed   By: Ulyses Jarred M.D.   On: 10/29/2020 02:58   MR ANGIO NECK WO CONTRAST  Result Date: 10/29/2020 CLINICAL DATA:  Headache EXAM: MRI HEAD WITHOUT CONTRAST MRA HEAD WITHOUT CONTRAST MRA NECK WITHOUT CONTRAST TECHNIQUE: Multiplanar, multiecho pulse sequences of the brain and surrounding structures were obtained without intravenous contrast. Angiographic images of the Circle of Willis were obtained using MRA technique without intravenous contrast. Angiographic images of the neck were obtained using MRA technique without intravenous contrast. Carotid stenosis measurements (when applicable) are obtained utilizing NASCET criteria, using the distal internal carotid diameter as the denominator. COMPARISON:  MRV brain 10/28/2020 FINDINGS: MRI HEAD FINDINGS Brain: No  acute infarct, mass effect or extra-axial collection. No acute or chronic hemorrhage. Normal white matter signal, parenchymal volume and CSF spaces. The midline structures are normal. Vascular: Abnormal signal within the superior sagittal sinus, left transverse sinus, left sigmoid sinus, compatible  with recently demonstrated dural venous thrombosis. Skull and upper cervical spine: Normal calvarium and skull base. Visualized upper cervical spine and soft tissues are normal. Sinuses/Orbits:No paranasal sinus fluid levels or advanced mucosal thickening. No mastoid or middle ear effusion. Normal orbits. MRA HEAD FINDINGS POSTERIOR CIRCULATION: --Vertebral arteries: Normal --Inferior cerebellar arteries: Normal. --Basilar artery: Normal. --Superior cerebellar arteries: Normal. --Posterior cerebral arteries: Normal. ANTERIOR CIRCULATION: --Intracranial internal carotid arteries: Normal. --Anterior cerebral arteries (ACA): Normal. --Middle cerebral arteries (MCA): Normal. ANATOMIC VARIANTS: Fetal origin of the right PCA. MRA NECK FINDINGS Normal 3 vessel branching pattern of the aortic arch. Vertebral arteries are codominant. Both vertebral arteries are normal from their origin to the skull base. There is no carotid stenosis or occlusion. IMPRESSION: 1. Abnormal signal within the superior sagittal sinus, left transverse sinus, left sigmoid sinus, compatible with recently demonstrated dural venous thrombosis. 2. No acute ischemia or hemorrhage. 3. Normal MRA of the head and neck. Electronically Signed   By: Ulyses Jarred M.D.   On: 10/29/2020 02:58   MR BRAIN WO CONTRAST  Result Date: 10/29/2020 CLINICAL DATA:  Headache EXAM: MRI HEAD WITHOUT CONTRAST MRA HEAD WITHOUT CONTRAST MRA NECK WITHOUT CONTRAST TECHNIQUE: Multiplanar, multiecho pulse sequences of the brain and surrounding structures were obtained without intravenous contrast. Angiographic images of the Circle of Willis were obtained using MRA technique without intravenous contrast. Angiographic images of the neck were obtained using MRA technique without intravenous contrast. Carotid stenosis measurements (when applicable) are obtained utilizing NASCET criteria, using the distal internal carotid diameter as the denominator. COMPARISON:  MRV brain  10/28/2020 FINDINGS: MRI HEAD FINDINGS Brain: No acute infarct, mass effect or extra-axial collection. No acute or chronic hemorrhage. Normal white matter signal, parenchymal volume and CSF spaces. The midline structures are normal. Vascular: Abnormal signal within the superior sagittal sinus, left transverse sinus, left sigmoid sinus, compatible with recently demonstrated dural venous thrombosis. Skull and upper cervical spine: Normal calvarium and skull base. Visualized upper cervical spine and soft tissues are normal. Sinuses/Orbits:No paranasal sinus fluid levels or advanced mucosal thickening. No mastoid or middle ear effusion. Normal orbits. MRA HEAD FINDINGS POSTERIOR CIRCULATION: --Vertebral arteries: Normal --Inferior cerebellar arteries: Normal. --Basilar artery: Normal. --Superior cerebellar arteries: Normal. --Posterior cerebral arteries: Normal. ANTERIOR CIRCULATION: --Intracranial internal carotid arteries: Normal. --Anterior cerebral arteries (ACA): Normal. --Middle cerebral arteries (MCA): Normal. ANATOMIC VARIANTS: Fetal origin of the right PCA. MRA NECK FINDINGS Normal 3 vessel branching pattern of the aortic arch. Vertebral arteries are codominant. Both vertebral arteries are normal from their origin to the skull base. There is no carotid stenosis or occlusion. IMPRESSION: 1. Abnormal signal within the superior sagittal sinus, left transverse sinus, left sigmoid sinus, compatible with recently demonstrated dural venous thrombosis. 2. No acute ischemia or hemorrhage. 3. Normal MRA of the head and neck. Electronically Signed   By: Ulyses Jarred M.D.   On: 10/29/2020 02:58   MR MRV HEAD W WO CONTRAST  Result Date: 10/28/2020 CLINICAL DATA:  Headache, near syncope EXAM: MR VENOGRAM HEAD WITHOUT AND WITH CONTRAST TECHNIQUE: Angiographic images of the intracranial venous structures were obtained using MRV technique without and with intravenous contrast. CONTRAST:  62mL GADAVIST GADOBUTROL 1 MMOL/ML  IV SOLN COMPARISON:  None. FINDINGS: There is nonocclusive thrombus extending from the  mid superior sagittal sinus posteriorly. A sliver extends posteriorly pre remainder of the superior sagittal sinus sinus is patent. Straight sinus, vein of Galen, and internal cerebral veins are patent. Right transverse and sigmoid sinuses are patent. Dominant left transverse sinus is patent. There is absent enhancement of the left sigmoid sinus and visualized left internal jugular vein. There is no abnormal intracranial enhancement. 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 Thrombus within the left sigmoid sinus and visualized left internal jugular vein. These results were called by telephone at the time of interpretation on 10/28/2020 at 5:00 pm to provider Dr. Almyra Free, Who verbally acknowledged these results. Electronically Signed   By: Macy Mis M.D.   On: 10/28/2020 17:03   ECHOCARDIOGRAM COMPLETE  Result Date: 10/29/2020    ECHOCARDIOGRAM REPORT   Patient Name:   SHONTRICE AROCHA Riggin Date of Exam: 10/29/2020 Medical Rec #:  [id number]            Height:       62.0 in Accession #:    [id number]           Weight:       132.0 lb Date of Birth:  03/15/1966           BSA:          1.602 m Patient Age:    61 years             BP:           131/72 mmHg Patient Gender: F                    HR:           47 bpm. Exam Location:  Inpatient Procedure: 2D Echo, Color Doppler and Cardiac Doppler Indications:    Stroke 434.91 / I163.9  History:        Patient has no prior history of Echocardiogram examinations.  Sonographer:    Bernadene Person RDCS Referring Phys: [id number] [place]  1. Left ventricular ejection fraction, by estimation, is 55 to 60%. The left ventricle has normal function. The left ventricle has no regional wall motion abnormalities. Left ventricular diastolic parameters were normal.  2. Right ventricular systolic function is normal. The right  ventricular size is normal. There is normal pulmonary artery systolic pressure. The estimated right ventricular systolic pressure is 123XX123 mmHg.  3. The mitral valve is normal in structure. No evidence of mitral valve regurgitation. No evidence of mitral stenosis.  4. The aortic valve is tricuspid. Aortic valve regurgitation is not visualized. No aortic stenosis is present.  5. The inferior vena cava is normal in size with greater than 50% respiratory variability, suggesting right atrial pressure of 3 mmHg. FINDINGS  Left Ventricle: Left ventricular ejection fraction, by estimation, is 55 to 60%. The left ventricle has normal function. The left ventricle has no regional wall motion abnormalities. The left ventricular internal cavity size was normal in size. There is  no left ventricular hypertrophy. Left ventricular diastolic parameters were normal. Right Ventricle: The right ventricular size is normal. No increase in right ventricular wall thickness. Right ventricular systolic function is normal. There is normal pulmonary artery systolic pressure. The tricuspid regurgitant velocity is 2.69 m/s, and  with an assumed right atrial pressure of 3 mmHg, the estimated right ventricular systolic pressure is 123XX123 mmHg. Left Atrium: Left atrial size was normal in size. Right Atrium: Right atrial size was normal in size. Pericardium: There is  no evidence of pericardial effusion. Mitral Valve: The mitral valve is normal in structure. No evidence of mitral valve regurgitation. No evidence of mitral valve stenosis. Tricuspid Valve: The tricuspid valve is normal in structure. Tricuspid valve regurgitation is trivial. Aortic Valve: The aortic valve is tricuspid. Aortic valve regurgitation is not visualized. No aortic stenosis is present. Pulmonic Valve: The pulmonic valve was normal in structure. Pulmonic valve regurgitation is not visualized. Aorta: The aortic root is normal in size and structure. Venous: The inferior vena cava is  normal in size with greater than 50% respiratory variability, suggesting right atrial pressure of 3 mmHg. IAS/Shunts: No atrial level shunt detected by color flow Doppler.  LEFT VENTRICLE PLAX 2D LVIDd:         3.90 cm  Diastology LVIDs:         2.70 cm  LV e' medial:    11.60 cm/s LV PW:         1.20 cm  LV E/e' medial:  8.1 LV IVS:        0.70 cm  LV e' lateral:   12.70 cm/s LVOT diam:     1.90 cm  LV E/e' lateral: 7.4 LV SV:         81 LV SV Index:   50 LVOT Area:     2.84 cm  RIGHT VENTRICLE RV S prime:     13.10 cm/s TAPSE (M-mode): 2.8 cm LEFT ATRIUM             Index       RIGHT ATRIUM           Index LA diam:        2.40 cm 1.50 cm/m  RA Area:     11.30 cm LA Vol (A2C):   44.0 ml 27.46 ml/m RA Volume:   20.10 ml  12.55 ml/m LA Vol (A4C):   48.3 ml 30.15 ml/m LA Biplane Vol: 46.7 ml 29.15 ml/m  AORTIC VALVE LVOT Vmax:   117.00 cm/s LVOT Vmean:  80.300 cm/s LVOT VTI:    0.285 m  AORTA Ao Root diam: 2.90 cm Ao Asc diam:  2.60 cm MITRAL VALVE               TRICUSPID VALVE MV Area (PHT): 3.77 cm    TR Peak grad:   28.9 mmHg MV Decel Time: 201 msec    TR Vmax:        269.00 cm/s MV E velocity: 93.60 cm/s MV A velocity: 63.50 cm/s  SHUNTS MV E/A ratio:  1.47        Systemic VTI:  0.29 m                            Systemic Diam: 1.90 cm Loralie Champagne MD Electronically signed by Loralie Champagne MD Signature Date/Time: 10/29/2020/12:53:25 PM    Final    CT MAXILLOFACIAL WO CONTRAST  Result Date: 10/28/2020 CLINICAL DATA:  Maxillofacial pain. Headache. 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 EXAM: CT MAXILLOFACIAL WITHOUT CONTRAST TECHNIQUE: Multidetector CT imaging of the maxillofacial structures was performed. Multiplanar CT image reconstructions were also generated. COMPARISON:  CT head 10/28/2020.  MR venogram 10/28/2020 FINDINGS: Osseous: Negative for fracture. No bone lesion identified. No acute dental infection Orbits: Normal orbit. No  mass or edema in the orbit. Normal bony orbit. Sinuses: Frontal sinus extensive mucosal edema left clear. Frontal sinus. Right remaining  paranasal sinuses clear. Mastoid sinus clear bilaterally. Soft tissues: No soft tissue mass or adenopathy.  Normal pharynx The left jugular vein is distended and ill-defined suggesting left jugular vein thrombosis. There is filling defect in the sigmoid sinus and proximal left jugular vein on MRV of the head today. Limited intracranial: Negative IMPRESSION: 1. Findings consistent with thrombus in the left jugular vein. This corresponds with filling defect in the left sigmoid sinus and jugular vein on MR venogram from today. 2. Mucosal edema left frontal sinus.  Remaining sinuses clear. Electronically Signed   By: Franchot Gallo M.D.   On: 10/28/2020 19:43   Scheduled Meds: . atorvastatin  40 mg Oral Daily   Continuous Infusions: . sodium chloride 50 mL/hr at 10/29/20 2132  . ampicillin-sulbactam (UNASYN) IV 3 g (10/30/20 [id number])  . heparin 850 Units/hr (10/29/20 2138)    LOS: 2 days   Kerney Elbe, DO [facility] PAGER is on [place]  If 7PM-7AM, please contact night-coverage [email or web address]

## 2020-10-30 NOTE — Plan of Care (Signed)

## 2020-10-30 NOTE — Progress Notes (Signed)
ANTICOAGULATION CONSULT NOTE  Pharmacy Consult:  Heparin Indication:  Venous sinus thrombus   Allergies  Allergen Reactions  . Doxycycline Nausea And Vomiting    Patient Measurements: Height: 5\' 2"  (157.5 cm) Weight: 59.9 kg (132 lb) IBW/kg (Calculated) : 50.1 Heparin Dosing Weight: 59 kg  Vital Signs: Temp: 97.3 F (36.3 C) (01/01 0612) Temp Source: Oral (01/01 0612) BP: 110/81 (01/01 0626) Pulse Rate: 47 (01/01 0626)  Labs: Recent Labs    10/28/20 0818 10/29/20 0236 10/29/20 1200 10/30/20 0123  HGB 11.8* 10.3*  --  10.2*  HCT 35.7* 32.7*  --  31.8*  PLT 102* 81*  --  83*  HEPARINUNFRC  --  0.30 0.34 0.30  CREATININE 1.37* 1.24*  --  1.23*    Estimated Creatinine Clearance: 42.3 mL/min (A) (by C-G formula based on SCr of 1.23 mg/dL (H)).   Assessment: 73 YOF presented with a headache, found to have venous sinus thrombosis.  Pharmacy consulted to manage IV heparin. Heparin level this AM is therapeutic at 0.30, low end of goal. No bleeding issues noted. Plt 83 up slightly and Hgb stable at 10.2.   Goal of Therapy:  Heparin level 0.3-0.5 units/ml Monitor platelets by anticoagulation protocol: Yes   Plan:  Continue heparin at 850units/hour Monitor for s/sx of bleeding  Daily HL and CBC   44, PharmD, [facility] Pharm[facility] Resident [phone] 10/30/2020 7:57 AM

## 2020-10-30 NOTE — Evaluation (Signed)
Physical Therapy Evaluation Patient Details Name: Sydney [name] MRN: [id number] DOB: [birth year] Today's Date: 10/30/2020   History of Present Illness  55yo female with recent c/o of sinus and middle ear infections treated with antibiotics and prednisone. She then developed pain and edema on the left side of  her neck associated with HA, nausea/vomiting, and dizziness. Found to have non-occlusive thrombus in the mid-superior sagittal sinus extending posteriorly, as well as a thrombus in the left sigmoid sinus and left IJ. No significant contributing PMH.  Clinical Impression   Patient received in bed, very pleasant and cooperative with therapy, reports she does feel mildly weak just from being in bed. Able to mobilize on a supervision to independent level with IV pole and assistance for multiple line management. Did very well with PT, does seem very mildly weak but feel this will resolve quickly naturally as she continues to mobilize- will put in OP PT recommendation in case she feels it would be helpful in her recovery. VSS on RA. Left up in recliner with all needs met, visitor present. No skilled PT services indicated- PT signing off, thank you for the referral!     Follow Up Recommendations Outpatient PT    Equipment Recommendations  None recommended by PT    Recommendations for Other Services       Precautions / Restrictions Precautions Precautions: Other (comment) Precaution Comments: light sensitivity, can cause HA Restrictions Weight Bearing Restrictions: No      Mobility  Bed Mobility Overal bed mobility: Independent             General bed mobility comments: increased time due to multiple lines, no physical assist given    Transfers Overall transfer level: Independent Equipment used: None             General transfer comment: no physical assist given, safe and steady  Ambulation/Gait Ambulation/Gait assistance: Supervision Gait Distance (Feet): 250  Feet Assistive device: IV Pole Gait Pattern/deviations: WFL(Within Functional Limits);Step-through pattern Gait velocity: decreased   General Gait Details: slow but steady with IV pole, feel she would do equally well without U UE support. VSS on RA.  Stairs            Wheelchair Mobility    Modified Rankin (Stroke Patients Only)       Balance Overall balance assessment: No apparent balance deficits (not formally assessed)                                           Pertinent Vitals/Pain Pain Assessment: No/denies pain    Home Living Family/patient expects to be discharged to:: Private residence Living Arrangements: Children;Other (Comment) (son is a Agricultural consultant and works) Available Help at Discharge: Family;Available PRN/intermittently Type of Home: House Home Access: Stairs to enter Entrance Stairs-Rails: Can reach both Entrance Stairs-Number of Steps: 5-6 with B rails Home Layout: One level Home Equipment: Shower seat - built in;None Additional Comments: was working out with [employer]- bootcamp style workouts    Prior Function Level of Independence: Independent         Comments: still working, 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     Journalist, newspaper        Extremity/Trunk Assessment   Upper Extremity Assessment Upper Extremity Assessment: Overall WFL for tasks assessed    Lower Extremity Assessment Lower Extremity Assessment: Overall WFL for tasks assessed  Cervical / Trunk Assessment Cervical / Trunk Assessment: Normal  Communication   Communication: No difficulties  Cognition Arousal/Alertness: Awake/alert Behavior During Therapy: WFL for tasks assessed/performed Overall Cognitive Status: Within Functional Limits for tasks assessed                                        General Comments      Exercises     Assessment/Plan    PT Assessment All further PT needs can be met in the next venue of care  PT Problem  List Decreased strength;Decreased activity tolerance       PT Treatment Interventions      PT Goals (Current goals can be found in the Care Plan section)  Acute Rehab PT Goals Patient Stated Goal: go home, get strength back PT Goal Formulation: With patient Time For Goal Achievement: 11/13/20 Potential to Achieve Goals: Good    Frequency     Barriers to discharge        Co-evaluation               AM-PAC PT "6 Clicks" Mobility  Outcome Measure Help needed turning from your back to your side while in a flat bed without using bedrails?: None Help needed moving from lying on your back to sitting on the side of a flat bed without using bedrails?: None Help needed moving to and from a bed to a chair (including a wheelchair)?: None Help needed standing up from a chair using your arms (e.g., wheelchair or bedside chair)?: None Help needed to walk in hospital room?: None Help needed climbing 3-5 steps with a railing? : A Little 6 Click Score: 23    End of Session   Activity Tolerance: Patient tolerated treatment well Patient left: in chair;with call bell/phone within reach;with family/visitor present Nurse Communication: Mobility status PT Visit Diagnosis: Muscle weakness (generalized) (M62.81)    Time: 1683-7290 PT Time Calculation (min) (ACUTE ONLY): 21 min   Charges:   PT Evaluation $PT Eval Moderate Complexity: 1 Mod          Windell Norfolk, DPT, PN1   Supplemental Physical Therapist [place]    Pager [phone] Acute Rehab Office [phone]

## 2020-10-30 NOTE — Progress Notes (Addendum)
STROKE TEAM PROGRESS NOTE   INTERVAL HISTORY No overnight events or new complaints. She is resting comfortably and states is back to baseline.   Vitals:   10/30/20 0047 10/30/20 0612 10/30/20 0626 10/30/20 0842  BP: (!) 155/70 135/76 110/81 (!) 162/60  Pulse: 78 85 (!) 47 (!) 50  Resp: 20 20 20 19   Temp: 98 F (36.7 C) (!) 97.3 F (36.3 C)  98 F (36.7 C)  TempSrc: Oral Oral    SpO2: 99% 97%    Weight:      Height:       CBC:  Recent Labs  Lab 10/29/20 0236 10/30/20 0123  WBC 8.9 7.6  NEUTROABS  --  5.5  HGB 10.3* 10.2*  HCT 32.7* 31.8*  MCV 90.6 89.1  PLT 81* 83*   Basic Metabolic Panel:  Recent Labs  Lab 10/29/20 0236 10/30/20 0123  NA 137 136  K 4.1 3.8  CL 104 103  CO2 23 27  GLUCOSE 99 100*  BUN 15 12  CREATININE 1.24* 1.23*  CALCIUM 8.5* 8.2*  MG  --  2.0  PHOS  --  2.6   Lab Results  Component Value Date   CHOL 239 (H) 08/27/2020   HDL 79 08/27/2020   LDLCALC 143 (H) 08/27/2020   TRIG 73 08/27/2020   CHOLHDL 3.0 08/27/2020   Heparin level 0.3  Lab Results  Component Value Date   HGBA1C 5.5 08/27/2020   Urine Drug Screen:  Recent Labs  Lab 10/28/20 1420  LABOPIA NONE DETECTED  COCAINSCRNUR NONE DETECTED  LABBENZ NONE DETECTED  AMPHETMU NONE DETECTED  THCU NONE DETECTED  LABBARB NONE DETECTED    Alcohol Level No results for input(s): ETH in the last 168 hours.  IMAGING past 24 hours   ECHOCARDIOGRAM COMPLETE 10/29/2020 IMPRESSIONS   1. Left ventricular ejection fraction, by estimation, is 55 to 60%. The left ventricle has normal function. The left ventricle has no regional wall motion abnormalities. Left ventricular diastolic parameters were normal.   2. Right ventricular systolic function is normal. The right ventricular size is normal. There is normal pulmonary artery systolic pressure. The estimated right ventricular systolic pressure is 123XX123 mmHg.   3. The mitral valve is normal in structure. No evidence of mitral valve  regurgitation. No evidence of mitral stenosis.   4. The aortic valve is tricuspid. Aortic valve regurgitation is not visualized. No aortic stenosis is present.   5. The inferior vena cava is normal in size with greater than 50% respiratory variability, suggesting right atrial pressure of 3 mmHg.  Final     PHYSICAL EXAM   Temp:  [97.3 F (36.3 C)-98.7 F (37.1 C)] 98 F (36.7 C) (01/01 0842) Pulse Rate:  [41-85] 50 (01/01 0842) Resp:  [13-22] 19 (01/01 0842) BP: (110-169)/(60-84) 162/60 (01/01 0842) SpO2:  [96 %-100 %] 97 % (01/01 0612)  General - Well nourished, well developed, in no apparent distress.  Ophthalmologic - fundi not visualized due to noncooperation.  Cardiovascular - Regular rhythm and rate.  Mental Status -  Level of arousal and orientation to time, place, and person were intact. Language including expression, naming, repetition, comprehension was assessed and found intact. Attention span and concentration were normal. Recent and remote memory were intact. Fund of Knowledge was assessed and was intact.  Cranial Nerves II - XII - II - Visual field intact OU. III, IV, VI - Extraocular movements intact. V - Facial sensation intact bilaterally. VII - Facial movement intact bilaterally. VIII -  Hearing & vestibular intact bilaterally. X - Palate elevates symmetrically. XI - Chin turning & shoulder shrug intact bilaterally. XII - Tongue protrusion intact.  Motor Strength - The patient's strength was normal in all extremities and pronator drift was absent.  Bulk was normal and fasciculations were absent.   Motor Tone - Muscle tone was assessed at the neck and appendages and was normal.  Reflexes - The patient's reflexes were symmetrical in all extremities and she had no pathological reflexes.  Sensory - Light touch, temperature/pinprick were assessed and were symmetrical.    Coordination - The patient had normal movements in the hands and feet with no ataxia or  dysmetria.  Tremor was absent.  Gait and Station - deferred.   ASSESSMENT/PLAN [name] is a 55 y.o. female with history of stage II CKD, anemia, on hormonal therapy for hot flashes with presumed sinus and ear infections over the past few months presenting with severe HA. Patient is feeling better and is back to her baseline.  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 Thrombus L sigmoid sinus and L internal jugular.   CT maxfac thrombus L jugular. L frontal sinus mucosal edema.   MRI  Abnormal sinal superior sagittal sinus, L transverse sinus, L sigmoid sinus. No acute ischemia or hemorrhage   MRA Head & neck   Unremarkable   2D Echo EF 55-60%. No source of embolus   LDL 143 08/27/20  HgbA1c 5.5 08/27/20  Hypercoagulable labs pending   VTE prophylaxis - IV heparin  No antithrombotic prior to admission, now on heparin IV (started 10/28/20) and morning level 0.3 and drip increased for goal 0.3-0.5. Stable tolerating IV heparin and tomorrow will recommend switching to DOAC with either eliquis.   Disposition:  Return home  Estrogen use  On JUNEL for hot flashes  Recommend to stop  Continue follow-up with OB/GYN  Hypertension  Home meds:  None - no hx HTN  slightly elevated 140s . Long-term BP goal normotensive  Hyperlipidemia  Home meds:  No statin  LDL 143 08/27/20  On Lipitor 40  Continue statin on discharge  AKI on CKD stage 2  Cre 1.37->1.24->1.23  Hx RI in setting of rhabdo.    baseline Cre 1.1  On IVF @ 50  Encourage po intake  Other Stroke Risk Factors   JUNEL for hot flashes, which is estrogen product  Other Active Problems  Thrombocytopenia 102->81->83  Anemia 11.8->10.3->10.2  Hospital day # 2  Reviewed history, symptoms and pathophysiology of sinus thrombosis with patient. Advised to stay well  hydrated.   To contact Stroke Continuity provider, please refer to [email or web address]. After hours, contact General Neurology

## 2020-10-30 NOTE — Plan of Care (Signed)
Discussed with patient plan of care for the evening, pain management and nausea prevention/control with some teach back displayed.  Problem: Education: Goal: Knowledge of General Education information will improve Description: Including pain rating scale, medication(s)/side effects and non-pharmacologic comfort measures Outcome: Progressing   Problem: Health Behavior/Discharge Planning: Goal: Ability to manage health-related needs will improve Outcome: Progressing

## 2020-10-30 NOTE — Progress Notes (Signed)
Contacted Pharmacy - UNASYN and Heparin compatible to run together.

## 2020-10-31 DIAGNOSIS — N1831 Chronic kidney disease, stage 3a: Secondary | ICD-10-CM | POA: Diagnosis not present

## 2020-10-31 LAB — CBC WITH DIFFERENTIAL/PLATELET
Abs Immature Granulocytes: 0.02 10*3/uL (ref 0.00–0.07)
Basophils Absolute: 0 10*3/uL (ref 0.0–0.1)
Basophils Relative: 1 %
Eosinophils Absolute: 0.1 10*3/uL (ref 0.0–0.5)
Eosinophils Relative: 1 %
HCT: 34.4 % — ABNORMAL LOW (ref 36.0–46.0)
Hemoglobin: 11 g/dL — ABNORMAL LOW (ref 12.0–15.0)
Immature Granulocytes: 0 %
Lymphocytes Relative: 21 %
Lymphs Abs: 1.4 10*3/uL (ref 0.7–4.0)
MCH: 28.6 pg (ref 26.0–34.0)
MCHC: 32 g/dL (ref 30.0–36.0)
MCV: 89.6 fL (ref 80.0–100.0)
Monocytes Absolute: 0.5 10*3/uL (ref 0.1–1.0)
Monocytes Relative: 8 %
Neutro Abs: 4.4 10*3/uL (ref 1.7–7.7)
Neutrophils Relative %: 69 %
Platelets: 78 10*3/uL — ABNORMAL LOW (ref 150–400)
RBC: 3.84 MIL/uL — ABNORMAL LOW (ref 3.87–5.11)
RDW: 13.8 % (ref 11.5–15.5)
WBC: 6.4 10*3/uL (ref 4.0–10.5)
nRBC: 0 % (ref 0.0–0.2)

## 2020-10-31 LAB — COMPREHENSIVE METABOLIC PANEL
ALT: 10 U/L (ref 0–44)
AST: 17 U/L (ref 15–41)
Albumin: 2.8 g/dL — ABNORMAL LOW (ref 3.5–5.0)
Alkaline Phosphatase: 41 U/L (ref 38–126)
Anion gap: 11 (ref 5–15)
BUN: 8 mg/dL (ref 6–20)
CO2: 23 mmol/L (ref 22–32)
Calcium: 8.3 mg/dL — ABNORMAL LOW (ref 8.9–10.3)
Chloride: 103 mmol/L (ref 98–111)
Creatinine, Ser: 1.18 mg/dL — ABNORMAL HIGH (ref 0.44–1.00)
GFR, Estimated: 56 mL/min — ABNORMAL LOW (ref >60)
Glucose, Bld: 93 mg/dL (ref 70–99)
Potassium: 3.7 mmol/L (ref 3.5–5.1)
Sodium: 137 mmol/L (ref 135–145)
Total Bilirubin: 0.9 mg/dL (ref 0.3–1.2)
Total Protein: 6.5 g/dL (ref 6.5–8.1)

## 2020-10-31 LAB — VITAMIN B12: Vitamin B-12: 233 pg/mL (ref 180–914)

## 2020-10-31 LAB — RETICULOCYTES
Immature Retic Fract: 19.3 % — ABNORMAL HIGH (ref 2.3–15.9)
RBC.: 3.61 MIL/uL — ABNORMAL LOW (ref 3.87–5.11)
Retic Count, Absolute: 62.8 10*3/uL (ref 19.0–186.0)
Retic Ct Pct: 1.7 % (ref 0.4–3.1)

## 2020-10-31 LAB — PROTEIN C ACTIVITY: Protein C Activity: 173 % (ref 73–180)

## 2020-10-31 LAB — LUPUS ANTICOAGULANT PANEL
DRVVT: 46.4 s (ref 0.0–47.0)
PTT Lupus Anticoagulant: 32.6 s (ref 0.0–51.9)

## 2020-10-31 LAB — IRON AND TIBC
Iron: 23 ug/dL — ABNORMAL LOW (ref 28–170)
Saturation Ratios: 6 % — ABNORMAL LOW (ref 10.4–31.8)
TIBC: 375 ug/dL (ref 250–450)
UIBC: 352 ug/dL

## 2020-10-31 LAB — HEPARIN LEVEL (UNFRACTIONATED): Heparin Unfractionated: 0.23 IU/mL — ABNORMAL LOW (ref 0.30–0.70)

## 2020-10-31 LAB — MAGNESIUM: Magnesium: 1.9 mg/dL (ref 1.7–2.4)

## 2020-10-31 LAB — PROTEIN S, TOTAL: Protein S Ag, Total: 65 % (ref 60–150)

## 2020-10-31 LAB — PROTEIN S ACTIVITY: Protein S Activity: 57 % — ABNORMAL LOW (ref 63–140)

## 2020-10-31 LAB — PHOSPHORUS: Phosphorus: 2.7 mg/dL (ref 2.5–4.6)

## 2020-10-31 LAB — FOLATE: Folate: 22 ng/mL (ref >5.9)

## 2020-10-31 LAB — FERRITIN: Ferritin: 13 ng/mL (ref 11–307)

## 2020-10-31 MED ORDER — AMOXICILLIN-POT CLAVULANATE 875-125 MG PO TABS: 1.0000 | ORAL_TABLET | Freq: Two times a day (BID) | ORAL | 0 refills | Status: AC

## 2020-10-31 MED ORDER — DABIGATRAN ETEXILATE MESYLATE 150 MG PO CAPS
150.0000 mg | ORAL_CAPSULE | Freq: Two times a day (BID) | ORAL | Status: DC
  Administered 2020-10-31: 150 mg via ORAL
  Filled 2020-10-31 (×3): qty 1

## 2020-10-31 MED ORDER — ENSURE ENLIVE PO LIQD: 237.0000 mL | Freq: Two times a day (BID) | ORAL | Status: DC

## 2020-10-31 MED ORDER — DABIGATRAN ETEXILATE MESYLATE 150 MG PO CAPS: 150.0000 mg | ORAL_CAPSULE | Freq: Two times a day (BID) | ORAL | 0 refills | Status: DC

## 2020-10-31 MED ORDER — ATORVASTATIN CALCIUM 40 MG PO TABS: 40.0000 mg | ORAL_TABLET | Freq: Every day | ORAL | 0 refills | Status: DC

## 2020-10-31 MED ORDER — ENSURE ENLIVE PO LIQD: 237.0000 mL | Freq: Two times a day (BID) | ORAL | 12 refills | Status: DC

## 2020-10-31 MED ORDER — ADULT MULTIVITAMIN W/MINERALS CH
1.0000 | ORAL_TABLET | Freq: Every day | ORAL | Status: DC
  Administered 2020-10-31: 1 via ORAL
  Filled 2020-10-31: qty 1

## 2020-10-31 MED ORDER — ONDANSETRON HCL 4 MG PO TABS: 4.0000 mg | ORAL_TABLET | Freq: Four times a day (QID) | ORAL | 0 refills | Status: DC | PRN

## 2020-10-31 NOTE — Discharge Instructions (Signed)
[email or web address]

## 2020-10-31 NOTE — Care Management (Signed)
DC prescriptions sent to [facility]. Spoke w pharmacist at [facility], who filled prescriptions, and Pradaxa cost ran through for $14.99/ month.

## 2020-10-31 NOTE — Evaluation (Signed)
Occupational Therapy Evaluation Patient Details Name: Sydney [name] MRN: [id number] DOB: 1966-02-03 Today's Date: 10/31/2020    History of Present Illness 55yo female with recent c/o of sinus and middle ear infections treated with antibiotics and prednisone. She then developed pain and edema on the left side of  her neck associated with HA, nausea/vomiting, and dizziness. Found to have non-occlusive thrombus in the mid-superior sagittal sinus extending posteriorly, as well as a thrombus in the left sigmoid sinus and left IJ. No significant contributing PMH.   Clinical Impression   Pt typically very independent, still works as CMA exercises regularly, today is independent for all aspects of ADL. Pt reports being a little nervous about bending head back in the shower so educated on safety precautions (Pt does have seat in the shower) Education complete and no further need for OT at this time. OT to sign off.    Follow Up Recommendations  No OT follow up    Equipment Recommendations  None recommended by OT    Recommendations for Other Services       Precautions / Restrictions Precautions Precautions: Other (comment) Precaution Comments: light sensitivity, can cause HA Restrictions Weight Bearing Restrictions: No      Mobility Bed Mobility Overal bed mobility: Modified Independent             General bed mobility comments: increased time due to multiple lines, no physical assist given    Transfers Overall transfer level: Independent Equipment used: None             General transfer comment: no physical assist given, safe and steady    Balance Overall balance assessment: No apparent balance deficits (not formally assessed)                                         ADL either performed or assessed with clinical judgement   ADL Overall ADL's : Modified independent                                       General ADL Comments:  able to complete transfers, LB dressing, sink level grooming, all at independent level. Did educated on shower safety as Pt is still a little nervous. Pt able to complete figure 4 and also bent over to toes from seated position.     Vision Patient Visual Report: Other (comment) (light sensativity) Vision Assessment?: No apparent visual deficits     Perception     Praxis      Pertinent Vitals/Pain Pain Assessment: No/denies pain     Hand Dominance     Extremity/Trunk Assessment Upper Extremity Assessment Upper Extremity Assessment: Overall WFL for tasks assessed   Lower Extremity Assessment Lower Extremity Assessment: Overall WFL for tasks assessed   Cervical / Trunk Assessment Cervical / Trunk Assessment: Normal   Communication Communication Communication: No difficulties   Cognition Arousal/Alertness: Awake/alert Behavior During Therapy: WFL for tasks assessed/performed Overall Cognitive Status: Within Functional Limits for tasks assessed                                     General Comments       Exercises     Shoulder Instructions      Home  Living Family/patient expects to be discharged to:: Private residence Living Arrangements: Children;Other (Comment) (son) Available Help at Discharge: Family;Available PRN/intermittently Type of Home: House Home Access: Stairs to enter [employer] of Steps: 5-6 with B rails Entrance Stairs-Rails: Can reach both Home Layout: One level     Bathroom Shower/Tub: Occupational psychologist: Standard     Home Equipment: Shower seat - built in;None   Additional Comments: was working out with [employer]- bootcamp style workouts      Prior Functioning/Environment Level of Independence: Independent        Comments: still working, 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        OT Problem List:        OT Treatment/Interventions:      OT Goals(Current goals can be found in the care plan  section) Acute Rehab OT Goals Patient Stated Goal: go home, get strength back OT Goal Formulation: With patient Time For Goal Achievement: 11/14/20 Potential to Achieve Goals: Good  OT Frequency:     Barriers to D/C:            Co-evaluation              AM-PAC OT "6 Clicks" Daily Activity     Outcome Measure Help from another person eating meals?: None Help from another person taking care of personal grooming?: None Help from another person toileting, which includes using toliet, bedpan, or urinal?: None Help from another person bathing (including washing, rinsing, drying)?: None Help from another person to put on and taking off regular upper body clothing?: None Help from another person to put on and taking off regular lower body clothing?: None 6 Click Score: 24   End of Session Nurse Communication: Mobility status;Other (comment) (in bathroom)  Activity Tolerance: Patient tolerated treatment well Patient left: Other (comment) (in bathroom)  OT Visit Diagnosis: Other abnormalities of gait and mobility (R26.89)                Time: [id number] OT Time Calculation (min): 19 min Charges:  OT General Charges $OT Visit: 1 Visit OT Evaluation $OT Eval Low Complexity: [place] OTR/L Acute Rehabilitation Services Pager: [phone] Office: [place] 10/31/2020, 12:27 PM

## 2020-10-31 NOTE — Progress Notes (Signed)
Initial Nutrition Assessment  RD working remotely.  DOCUMENTATION CODES:   Not applicable  INTERVENTION:   - Ensure Enlive po BID, each supplement provides 350 kcal and 20 grams of protein  - MVI with minerals daily  NUTRITION DIAGNOSIS:   Increased nutrient needs related to acute illness as evidenced by estimated needs.  GOAL:   Patient will meet greater than or equal to 90% of their needs  MONITOR:   PO intake,Supplement acceptance,Labs,Weight trends  REASON FOR ASSESSMENT:   Consult Diet education,Poor PO  ASSESSMENT:   55 year old female who presented to the ED on 12/30 with headache and near syncopal episode. PMH of CKD stage II, anemia. Admitted with cerebral venous sinus thrombosis and AKI.   Per Neurology, thrombus could be related to estrogen use.  RD consulted for poor PO intake. Pt on a regular diet since 12/30 and no meal completions charted. Noted pt was experiencing nausea vomiting. Per MD note on 1/01, this has improved but pt not feeling hungry.  Unable to reach pt via phone call to room. RD will order oral nutrition supplements to aid pt in meeting kcal and protein needs during admission. Noted pt with d/c orders in place.  Weight of 132 lbs on admission appears to be stated rather than measured. If accurate, pt has experienced a 4.1 kg weight loss since 09/28/20. This is a 6.5% weight loss in a little over 1 month which is significant for timeframe.  Medications reviewed and include: heparin, IV abx IVF: NS @ 50 ml/hr  Labs reviewed.  NUTRITION - FOCUSED PHYSICAL EXAM:  Unable to complete at this time. RD working remotely.  Diet Order:   Diet Order            Diet - low sodium heart healthy           Diet regular Room service appropriate? Yes; Fluid consistency: Thin  Diet effective now                 EDUCATION NEEDS:   No education needs have been identified at this time  Skin:  Skin Assessment: Reviewed RN Assessment  Last BM:   no documented BM  Height:   Ht Readings from Last 1 Encounters:  10/28/20 5\' 2"  (1.575 m)    Weight:   Wt Readings from Last 1 Encounters:  10/28/20 59.9 kg    BMI:  Body mass index is 24.14 kg/m.  Estimated Nutritional Needs:   Kcal:  1550-1750  Protein:  70-85 grams  Fluid:  1.5-1.7 L    10/30/20, MS, RD, LDN Inpatient Clinical Dietitian Please see [facility] for contact information.

## 2020-10-31 NOTE — Plan of Care (Signed)

## 2020-10-31 NOTE — Discharge Summary (Signed)
Physician Discharge Summary  Sydney [name] [id number] DOB: May 02, 1966 DOA: 10/28/2020  PCP: [name], DO  Admit date: 10/28/2020 Discharge date: 10/31/2020  Admitted From: Home Disposition: Home  Recommendations for Outpatient Follow-up:  1. Follow up with PCP in 1-2 weeks 2. Follow up with Neurology within 6 weeks 3. Advised to stop Estrogen 4. Please obtain CMP/CBC, Mag, Phos in one week 5. Please follow up on the following pending results: Hypercoagulable panel  Home Health: No Equipment/Devices: None   Discharge Condition: Stable CODE STATUS: FULL CODE Diet recommendation: Heart Healthy Diet  Brief/Interim Summary: HPI per Dr. Domenic Polite on 10/28/20 Sydney Holmes Gossettis a 55 y.o.femalewith medical history significantfor mild kidney disease, rhabdomyolysis few years ago but no other significant medical history presented to the ED today with multiple complaints. -Over the past 3 weeks she has been treated by her PCP with multiple courses of antibiotics(amoxicillin followed by cefdinir)for sinus infections which progressed to middle ear infections,she also had a course of prednisone.Her ears and sinuses started improving however developed pain and swelling in the left side of her neck associated with headaches over the last few days.Describes the headache as throbbing, right behind her years and close to her left ear as well.  -Today when she was walking started developing nausea and some dizziness,and a few episodes of vomiting and felt like she was about to pass out.She denies any loss of consciousness. -Denies any fevers or chills,denies any symptoms of Covid, she has received 2 Pfizer shots in January and February followed by booster in November. ED Course:Vital signs were unremarkable, labs were notable for potassium 3.1, creatinine of 1.3, white count of 10.9, Covid PCR and urine hCG were negative.She had an MRV done which was notable for  Nonocclusive thrombus within the mid superior sagittal sinus extending posteriorly. Thrombus within the left sigmoid sinus and visualized left internal jugular vein  **Interim History Neuorlogy was consulted and patient was started on IV heparin for her cerebral venous sinus thrombosis.  Neurology also recommended hypercoagulation labs and an MRI of the brain to evaluate for underlying venous infarcts as well as a stat head CT if she had a change in neurological status. MRI was done and is below. Patient continued to have some nausea vomiting that was not amenable to ondansetron so she was started on Compazine.  Neurology feels her thrombus could be related to estrogen use.  They are recommending continuing heparin drip for few days and if she improves and has no bleeding then can safely change to Eliquis or Pradaxa.  She steadily improved and ambulated without therapy.  Her nausea vomiting improved significantly and her headache improved.  She is transition to p.o. Pradaxa today as it will be relatively affordable for her.  She is stable to be discharged and neurology recommended follow-up within 6 weeks.  At this time we will discharge her home and continue Pradaxa and atorvastatin at discharge.  Discharge Diagnoses:  Principal Problem:   Cerebral venous sinus thrombosis Active Problems:   CKD (chronic kidney disease) stage 3, GFR 30-59 ml/min (HCC)   Cerebral venous sinus thrombosis, acute  Cerebral venous sinus thrombosis with presentation of headache possibly due to estrogen usage -Head CT done and was a normal head CT -CT Maxillofacial done and showed "Findings consistent with thrombus in the left jugular vein. This corresponds with filling defect in the left sigmoid sinus and jugular vein on MR venogram from today. 2. Mucosal edema left frontal sinus. Remaining sinuses clear."   -  Etiologies could be potentially infectious,recent long course of ear infections/ ? Chronic otitis media  potentialyaffecting the dural sinus,will check CT head and neck, add Empiric Unasyn pending further imaging -Neurology feels that it could be related to her estrogen usage -Hormonal oral contraceptive pills(Junel 1/20 1-20)is an important risk factor, she has been on this since August -Neurology consulted and recommended IV heparin for now -Will also order hypercoagulable panel -Per Neuro- start IV Heparin and continue for a few days and transition to Pradaxa or Eliquis if tolerates heparin drip -MRI done of the head, the neck as well as the brain that showed no acute infarct, mass-effect or extra axial collection no acute or chronic hemorrhage.  There was normal white matter signal and parenchymal volume and CFS spaces.  Overall impression was that there is an abnormal signal within the superior sagittal sinus left transverse sinus, left sigmoid sinus, compatible recently demonstrated dural venous thrombosis and no acute ischemia or hemorrhage.  Normal MRA of the head neck -He is admitted to the progressive care unit and currently neurology recommending at least 2 days of heparin and switching to DOAC either Eliquis or Pradaxa  -Her hypercoagulable panel done and fully pending still but her Antithrombin activity was 107, PTT lupus anticoagulant was 32.6, her DRV VT was 46.4, her protein C functional was 173, her protein S functional was 57, her protein S total was 65, her homocysteine level was 9.1 and the rest of her hypercoagulable panel is still pending -We will obtain PT OT to further evaluate and treat  Nausea and vomiting setting of headache, improved -Likely related to above -Continue with antiemetics and supportive care -Continue with Ondansetron 4 mg po/IV q6hprn and also now has as needed Proclorperazine 10 mg IV q6hprn -Her nausea vomiting is improved but she is not very hungry so we will obtain a nutritionist consult -Nutritionist recommended Ensure Enlive and multivitamin with  minerals daily p.o. twice daily and she tolerated her diet without issues  AKi on CKD (chronic kidney disease) stage3a -Prior history of renal insufficiency in the setting of rhabdomyolysis, baseline creatinine was 1.1 in November -Creatinine is 1.3 on admission, continue gentle hydration overnight and her BUN/creatinine is now 50/1.24  day before yesterday it is 12/1.23 yesterday and today it is 8/1.1 neck -Aoid nephrotoxic medications, hypotension, contrast dyes as well as renally dose medications -Repeat CMP within 1 week  Leukocytosis  -Improved and likely in the above setting -Patient's WBC went from 10.9 -> 8.9 -> 7.6 and today is 6.4 -continue monitor for signs and symptoms infection -repeat CBC in a.m.  Normocytic anemia -Patient's hemoglobin/hematocrit went from 11.8/35.7 -> 10.3/32.7 -> 10.2/31.8 and today it is 11.0/34.4 -Checked Anemia Panel and showed an iron level of 23, U IBC of 352, TIBC of 375, saturation ratio of 6%, ferritin level of 13, folate of 22.0, vitamin B12 level 233 -Continue to monitor for signs and symptoms of bleeding she is anticoagulated on a heparin drip; currently no overt bleeding noted -repeat CBC in a.m.  Thrombocytopenia -patient platelet count of 102 on admission is now 62 yesterday and today is 78 -Continue to monitor for signs and symptoms of bleeding; currently no overt bleeding -repeat CBC in a.m.  HLD  -Lipid Panel done on 08/27/20 showed Cholesterol Level of 239, HDL of 79, LDL 143, TG of 73,  -She was started on Atorvastatin 40 mg po Daily and will continue at D/C  Sinus and Middle Ear Infections -Has been on Multiple Abx over  the coarse of the last 3 weeks; Amoxicillin followed by Cefdinir and also had a course of Prednisone -Developed pain and swelling in the Left side of her Neck associated with Headaches over the last few days -C/w Unasyn 3 grams q6h while hospitalized and transition to p.o. Augmentin for total of 7 days of  treatment  Hypokalemia -This was replete and potassium is stable at discharge at 3.7 -Continue to monitor and replete as necessary -Repeat CMP within 1 week  Discharge Instructions  Discharge Instructions    Ambulatory referral to Neurology   Complete by: As directed    An appointment is requested in approximately: 6 weeks   Call MD for:  difficulty breathing, headache or visual disturbances   Complete by: As directed    Call MD for:  extreme fatigue   Complete by: As directed    Call MD for:  hives   Complete by: As directed    Call MD for:  persistant dizziness or light-headedness   Complete by: As directed    Call MD for:  persistant nausea and vomiting   Complete by: As directed    Call MD for:  redness, tenderness, or signs of infection (pain, swelling, redness, odor or green/yellow discharge around incision site)   Complete by: As directed    Call MD for:  severe uncontrolled pain   Complete by: As directed    Call MD for:  temperature >100.4   Complete by: As directed    Diet - low sodium heart healthy   Complete by: As directed    Increase activity slowly   Complete by: As directed      Allergies as of 10/31/2020      Reactions   Doxycycline Nausea And Vomiting      Medication List    STOP taking these medications   Junel 1/20 1-20 MG-MCG tablet Generic drug: norethindrone-ethinyl estradiol   predniSONE 10 MG tablet Commonly known as: DELTASONE     TAKE these medications   acetaminophen 500 MG tablet Commonly known as: TYLENOL Take 1,000 mg by mouth every 6 (six) hours as needed for mild pain or headache.   amoxicillin-clavulanate 875-125 MG tablet Commonly known as: Augmentin Take 1 tablet by mouth every 12 (twelve) hours for 4 days.   atorvastatin 40 MG tablet Commonly known as: LIPITOR Take 1 tablet (40 mg total) by mouth daily.   dabigatran 150 MG Caps capsule Commonly known as: PRADAXA Take 1 capsule (150 mg total) by mouth every 12  (twelve) hours.   feeding supplement Liqd Take 237 mLs by mouth 2 (two) times daily between meals.   multivitamin tablet Take 1 tablet by mouth daily.   ondansetron 4 MG tablet Commonly known as: ZOFRAN Take 1 tablet (4 mg total) by mouth every 6 (six) hours as needed for nausea.   valACYclovir 500 MG tablet Commonly known as: VALTREX Take 500 mg by mouth 2 (two) times daily as needed (flare up).       Follow-up Information    Kuneff, Renee A, DO. Call.   Specialty: Family Medicine Why: Follow up within 1-2 weeks Contact information: [place] [address] [phone]        [facility] Follow up.   Specialty: Neurology Why: Follow up in 6 weeks Contact information: [place] [phone]             Allergies  Allergen Reactions  . Doxycycline Nausea  And Vomiting   Consultations:  Neurology  Procedures/Studies: CT HEAD WO CONTRAST  Result Date: 10/28/2020 CLINICAL DATA:  Headache, nausea. EXAM: CT HEAD WITHOUT CONTRAST TECHNIQUE: Contiguous axial images were obtained from the base of the skull through the vertex without intravenous contrast. COMPARISON:  None. FINDINGS: Brain: No evidence of acute infarction, hemorrhage, hydrocephalus, extra-axial collection or mass lesion/mass effect. Vascular: No hyperdense vessel or unexpected calcification. Skull: Normal. Negative for fracture or focal lesion. Sinuses/Orbits: No acute finding. Other: None. IMPRESSION: Normal head CT. Electronically Signed   By: Marijo Conception M.D.   On: 10/28/2020 09:28   MR ANGIO HEAD WO CONTRAST  Result Date: 10/29/2020 CLINICAL DATA:  Headache EXAM: MRI HEAD WITHOUT CONTRAST MRA HEAD WITHOUT CONTRAST MRA NECK WITHOUT CONTRAST TECHNIQUE: Multiplanar, multiecho pulse sequences of the brain and surrounding structures were obtained without intravenous contrast. Angiographic images of the Circle of Willis  were obtained using MRA technique without intravenous contrast. Angiographic images of the neck were obtained using MRA technique without intravenous contrast. Carotid stenosis measurements (when applicable) are obtained utilizing NASCET criteria, using the distal internal carotid diameter as the denominator. COMPARISON:  MRV brain 10/28/2020 FINDINGS: MRI HEAD FINDINGS Brain: No acute infarct, mass effect or extra-axial collection. No acute or chronic hemorrhage. Normal white matter signal, parenchymal volume and CSF spaces. The midline structures are normal. Vascular: Abnormal signal within the superior sagittal sinus, left transverse sinus, left sigmoid sinus, compatible with recently demonstrated dural venous thrombosis. Skull and upper cervical spine: Normal calvarium and skull base. Visualized upper cervical spine and soft tissues are normal. Sinuses/Orbits:No paranasal sinus fluid levels or advanced mucosal thickening. No mastoid or middle ear effusion. Normal orbits. MRA HEAD FINDINGS POSTERIOR CIRCULATION: --Vertebral arteries: Normal --Inferior cerebellar arteries: Normal. --Basilar artery: Normal. --Superior cerebellar arteries: Normal. --Posterior cerebral arteries: Normal. ANTERIOR CIRCULATION: --Intracranial internal carotid arteries: Normal. --Anterior cerebral arteries (ACA): Normal. --Middle cerebral arteries (MCA): Normal. ANATOMIC VARIANTS: Fetal origin of the right PCA. MRA NECK FINDINGS Normal 3 vessel branching pattern of the aortic arch. Vertebral arteries are codominant. Both vertebral arteries are normal from their origin to the skull base. There is no carotid stenosis or occlusion. IMPRESSION: 1. Abnormal signal within the superior sagittal sinus, left transverse sinus, left sigmoid sinus, compatible with recently demonstrated dural venous thrombosis. 2. No acute ischemia or hemorrhage. 3. Normal MRA of the head and neck. Electronically Signed   By: Ulyses Jarred M.D.   On: 10/29/2020 02:58    MR ANGIO NECK WO CONTRAST  Result Date: 10/29/2020 CLINICAL DATA:  Headache EXAM: MRI HEAD WITHOUT CONTRAST MRA HEAD WITHOUT CONTRAST MRA NECK WITHOUT CONTRAST TECHNIQUE: Multiplanar, multiecho pulse sequences of the brain and surrounding structures were obtained without intravenous contrast. Angiographic images of the Circle of Willis were obtained using MRA technique without intravenous contrast. Angiographic images of the neck were obtained using MRA technique without intravenous contrast. Carotid stenosis measurements (when applicable) are obtained utilizing NASCET criteria, using the distal internal carotid diameter as the denominator. COMPARISON:  MRV brain 10/28/2020 FINDINGS: MRI HEAD FINDINGS Brain: No acute infarct, mass effect or extra-axial collection. No acute or chronic hemorrhage. Normal white matter signal, parenchymal volume and CSF spaces. The midline structures are normal. Vascular: Abnormal signal within the superior sagittal sinus, left transverse sinus, left sigmoid sinus, compatible with recently demonstrated dural venous thrombosis. Skull and upper cervical spine: Normal calvarium and skull base. Visualized upper cervical spine and soft tissues are normal. Sinuses/Orbits:No paranasal sinus fluid levels or advanced mucosal thickening.  No mastoid or middle ear effusion. Normal orbits. MRA HEAD FINDINGS POSTERIOR CIRCULATION: --Vertebral arteries: Normal --Inferior cerebellar arteries: Normal. --Basilar artery: Normal. --Superior cerebellar arteries: Normal. --Posterior cerebral arteries: Normal. ANTERIOR CIRCULATION: --Intracranial internal carotid arteries: Normal. --Anterior cerebral arteries (ACA): Normal. --Middle cerebral arteries (MCA): Normal. ANATOMIC VARIANTS: Fetal origin of the right PCA. MRA NECK FINDINGS Normal 3 vessel branching pattern of the aortic arch. Vertebral arteries are codominant. Both vertebral arteries are normal from their origin to the skull base. There is no  carotid stenosis or occlusion. IMPRESSION: 1. Abnormal signal within the superior sagittal sinus, left transverse sinus, left sigmoid sinus, compatible with recently demonstrated dural venous thrombosis. 2. No acute ischemia or hemorrhage. 3. Normal MRA of the head and neck. Electronically Signed   By: Ulyses Jarred M.D.   On: 10/29/2020 02:58   MR BRAIN WO CONTRAST  Result Date: 10/29/2020 CLINICAL DATA:  Headache EXAM: MRI HEAD WITHOUT CONTRAST MRA HEAD WITHOUT CONTRAST MRA NECK WITHOUT CONTRAST TECHNIQUE: Multiplanar, multiecho pulse sequences of the brain and surrounding structures were obtained without intravenous contrast. Angiographic images of the Circle of Willis were obtained using MRA technique without intravenous contrast. Angiographic images of the neck were obtained using MRA technique without intravenous contrast. Carotid stenosis measurements (when applicable) are obtained utilizing NASCET criteria, using the distal internal carotid diameter as the denominator. COMPARISON:  MRV brain 10/28/2020 FINDINGS: MRI HEAD FINDINGS Brain: No acute infarct, mass effect or extra-axial collection. No acute or chronic hemorrhage. Normal white matter signal, parenchymal volume and CSF spaces. The midline structures are normal. Vascular: Abnormal signal within the superior sagittal sinus, left transverse sinus, left sigmoid sinus, compatible with recently demonstrated dural venous thrombosis. Skull and upper cervical spine: Normal calvarium and skull base. Visualized upper cervical spine and soft tissues are normal. Sinuses/Orbits:No paranasal sinus fluid levels or advanced mucosal thickening. No mastoid or middle ear effusion. Normal orbits. MRA HEAD FINDINGS POSTERIOR CIRCULATION: --Vertebral arteries: Normal --Inferior cerebellar arteries: Normal. --Basilar artery: Normal. --Superior cerebellar arteries: Normal. --Posterior cerebral arteries: Normal. ANTERIOR CIRCULATION: --Intracranial internal carotid  arteries: Normal. --Anterior cerebral arteries (ACA): Normal. --Middle cerebral arteries (MCA): Normal. ANATOMIC VARIANTS: Fetal origin of the right PCA. MRA NECK FINDINGS Normal 3 vessel branching pattern of the aortic arch. Vertebral arteries are codominant. Both vertebral arteries are normal from their origin to the skull base. There is no carotid stenosis or occlusion. IMPRESSION: 1. Abnormal signal within the superior sagittal sinus, left transverse sinus, left sigmoid sinus, compatible with recently demonstrated dural venous thrombosis. 2. No acute ischemia or hemorrhage. 3. Normal MRA of the head and neck. Electronically Signed   By: Ulyses Jarred M.D.   On: 10/29/2020 02:58   MR MRV HEAD W WO CONTRAST  Result Date: 10/28/2020 CLINICAL DATA:  Headache, near syncope EXAM: MR VENOGRAM HEAD WITHOUT AND WITH CONTRAST TECHNIQUE: Angiographic images of the intracranial venous structures were obtained using MRV technique without and with intravenous contrast. CONTRAST:  76mL GADAVIST GADOBUTROL 1 MMOL/ML IV SOLN COMPARISON:  None. FINDINGS: There is nonocclusive thrombus extending from the mid superior sagittal sinus posteriorly. A sliver extends posteriorly pre remainder of the superior sagittal sinus sinus is patent. Straight sinus, vein of Galen, and internal cerebral veins are patent. Right transverse and sigmoid sinuses are patent. Dominant left transverse sinus is patent. There is absent enhancement of the left sigmoid sinus and visualized left internal jugular vein. There is no abnormal intracranial enhancement. 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 Thrombus  within the left sigmoid sinus and visualized left internal jugular vein. These results were called by telephone at the time of interpretation on 10/28/2020 at 5:00 pm to provider Dr. Almyra Free, Who verbally acknowledged these results. Electronically Signed   By: Macy Mis M.D.   On: 10/28/2020  17:03   ECHOCARDIOGRAM COMPLETE  Result Date: 10/29/2020    ECHOCARDIOGRAM REPORT   Patient Name:   LAYLA LANTERMAN Zipp Date of Exam: 10/29/2020 Medical Rec #:  [id number]            Height:       62.0 in Accession #:    [id number]           Weight:       132.0 lb Date of Birth:  06-Mar-1966           BSA:          1.602 m Patient Age:    5 years             BP:           131/72 mmHg Patient Gender: F                    HR:           47 bpm. Exam Location:  Inpatient Procedure: 2D Echo, Color Doppler and Cardiac Doppler Indications:    Stroke 434.91 / I163.9  History:        Patient has no prior history of Echocardiogram examinations.  Sonographer:    Bernadene Person RDCS Referring Phys: [id number] [place]  1. Left ventricular ejection fraction, by estimation, is 55 to 60%. The left ventricle has normal function. The left ventricle has no regional wall motion abnormalities. Left ventricular diastolic parameters were normal.  2. Right ventricular systolic function is normal. The right ventricular size is normal. There is normal pulmonary artery systolic pressure. The estimated right ventricular systolic pressure is 123XX123 mmHg.  3. The mitral valve is normal in structure. No evidence of mitral valve regurgitation. No evidence of mitral stenosis.  4. The aortic valve is tricuspid. Aortic valve regurgitation is not visualized. No aortic stenosis is present.  5. The inferior vena cava is normal in size with greater than 50% respiratory variability, suggesting right atrial pressure of 3 mmHg. FINDINGS  Left Ventricle: Left ventricular ejection fraction, by estimation, is 55 to 60%. The left ventricle has normal function. The left ventricle has no regional wall motion abnormalities. The left ventricular internal cavity size was normal in size. There is  no left ventricular hypertrophy. Left ventricular diastolic parameters were normal. Right Ventricle: The right ventricular size is normal. No increase in  right ventricular wall thickness. Right ventricular systolic function is normal. There is normal pulmonary artery systolic pressure. The tricuspid regurgitant velocity is 2.69 m/s, and  with an assumed right atrial pressure of 3 mmHg, the estimated right ventricular systolic pressure is 123XX123 mmHg. Left Atrium: Left atrial size was normal in size. Right Atrium: Right atrial size was normal in size. Pericardium: There is no evidence of pericardial effusion. Mitral Valve: The mitral valve is normal in structure. No evidence of mitral valve regurgitation. No evidence of mitral valve stenosis. Tricuspid Valve: The tricuspid valve is normal in structure. Tricuspid valve regurgitation is trivial. Aortic Valve: The aortic valve is tricuspid. Aortic valve regurgitation is not visualized. No aortic stenosis is present. Pulmonic Valve: The pulmonic valve was normal in structure. Pulmonic valve regurgitation is not visualized. Aorta: The  aortic root is normal in size and structure. Venous: The inferior vena cava is normal in size with greater than 50% respiratory variability, suggesting right atrial pressure of 3 mmHg. IAS/Shunts: No atrial level shunt detected by color flow Doppler.  LEFT VENTRICLE PLAX 2D LVIDd:         3.90 cm  Diastology LVIDs:         2.70 cm  LV e' medial:    11.60 cm/s LV PW:         1.20 cm  LV E/e' medial:  8.1 LV IVS:        0.70 cm  LV e' lateral:   12.70 cm/s LVOT diam:     1.90 cm  LV E/e' lateral: 7.4 LV SV:         81 LV SV Index:   50 LVOT Area:     2.84 cm  RIGHT VENTRICLE RV S prime:     13.10 cm/s TAPSE (M-mode): 2.8 cm LEFT ATRIUM             Index       RIGHT ATRIUM           Index LA diam:        2.40 cm 1.50 cm/m  RA Area:     11.30 cm LA Vol (A2C):   44.0 ml 27.46 ml/m RA Volume:   20.10 ml  12.55 ml/m LA Vol (A4C):   48.3 ml 30.15 ml/m LA Biplane Vol: 46.7 ml 29.15 ml/m  AORTIC VALVE LVOT Vmax:   117.00 cm/s LVOT Vmean:  80.300 cm/s LVOT VTI:    0.285 m  AORTA Ao Root diam: 2.90  cm Ao Asc diam:  2.60 cm MITRAL VALVE               TRICUSPID VALVE MV Area (PHT): 3.77 cm    TR Peak grad:   28.9 mmHg MV Decel Time: 201 msec    TR Vmax:        269.00 cm/s MV E velocity: 93.60 cm/s MV A velocity: 63.50 cm/s  SHUNTS MV E/A ratio:  1.47        Systemic VTI:  0.29 m                            Systemic Diam: 1.90 cm Loralie Champagne MD Electronically signed by Loralie Champagne MD Signature Date/Time: 10/29/2020/12:53:25 PM    Final    CT MAXILLOFACIAL WO CONTRAST  Result Date: 10/28/2020 CLINICAL DATA:  Maxillofacial pain. Headache. 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 EXAM: CT MAXILLOFACIAL WITHOUT CONTRAST TECHNIQUE: Multidetector CT imaging of the maxillofacial structures was performed. Multiplanar CT image reconstructions were also generated. COMPARISON:  CT head 10/28/2020.  MR venogram 10/28/2020 FINDINGS: Osseous: Negative for fracture. No bone lesion identified. No acute dental infection Orbits: Normal orbit. No mass or edema in the orbit. Normal bony orbit. Sinuses: Frontal sinus extensive mucosal edema left clear. Frontal sinus. Right remaining paranasal sinuses clear. Mastoid sinus clear bilaterally. Soft tissues: No soft tissue mass or adenopathy.  Normal pharynx The left jugular vein is distended and ill-defined suggesting left jugular vein thrombosis. There is filling defect in the sigmoid sinus and proximal left jugular vein on MRV of the head today. Limited intracranial: Negative IMPRESSION: 1. Findings consistent with thrombus in the left jugular vein. This corresponds with filling defect in the left sigmoid sinus and jugular  vein on MR venogram from today. 2. Mucosal edema left frontal sinus.  Remaining sinuses clear. Electronically Signed   By: Marlan Palau M.D.   On: 10/28/2020 19:43    ECHOCARDIOGRAM IMPRESSIONS    1. Left ventricular ejection fraction, by estimation, is 55 to 60%. The  left ventricle  has normal function. The left ventricle has no regional  wall motion abnormalities. Left ventricular diastolic parameters were  normal.  2. Right ventricular systolic function is normal. The right ventricular  size is normal. There is normal pulmonary artery systolic pressure. The  estimated right ventricular systolic pressure is 31.9 mmHg.  3. The mitral valve is normal in structure. No evidence of mitral valve  regurgitation. No evidence of mitral stenosis.  4. The aortic valve is tricuspid. Aortic valve regurgitation is not  visualized. No aortic stenosis is present.  5. The inferior vena cava is normal in size with greater than 50%  respiratory variability, suggesting right atrial pressure of 3 mmHg.   FINDINGS  Left Ventricle: Left ventricular ejection fraction, by estimation, is 55  to 60%. The left ventricle has normal function. The left ventricle has no  regional wall motion abnormalities. The left ventricular internal cavity  size was normal in size. There is  no left ventricular hypertrophy. Left ventricular diastolic parameters  were normal.   Right Ventricle: The right ventricular size is normal. No increase in  right ventricular wall thickness. Right ventricular systolic function is  normal. There is normal pulmonary artery systolic pressure. The tricuspid  regurgitant velocity is 2.69 m/s, and  with an assumed right atrial pressure of 3 mmHg, the estimated right  ventricular systolic pressure is 31.9 mmHg.   Left Atrium: Left atrial size was normal in size.   Right Atrium: Right atrial size was normal in size.   Pericardium: There is no evidence of pericardial effusion.   Mitral Valve: The mitral valve is normal in structure. No evidence of  mitral valve regurgitation. No evidence of mitral valve stenosis.   Tricuspid Valve: The tricuspid valve is normal in structure. Tricuspid  valve regurgitation is trivial.   Aortic Valve: The aortic valve is tricuspid.  Aortic valve regurgitation is  not visualized. No aortic stenosis is present.   Pulmonic Valve: The pulmonic valve was normal in structure. Pulmonic valve  regurgitation is not visualized.   Aorta: The aortic root is normal in size and structure.   Venous: The inferior vena cava is normal in size with greater than 50%  respiratory variability, suggesting right atrial pressure of 3 mmHg.   IAS/Shunts: No atrial level shunt detected by color flow Doppler.     LEFT VENTRICLE  PLAX 2D  LVIDd:     3.90 cm Diastology  LVIDs:     2.70 cm LV e' medial:  11.60 cm/s  LV PW:     1.20 cm LV E/e' medial: 8.1  LV IVS:    0.70 cm LV e' lateral:  12.70 cm/s  LVOT diam:   1.90 cm LV E/e' lateral: 7.4  LV SV:     81  LV SV Index:  50  LVOT Area:   2.84 cm     RIGHT VENTRICLE  RV S prime:   13.10 cm/s  TAPSE (M-mode): 2.8 cm   LEFT ATRIUM       Index    RIGHT ATRIUM      Index  LA diam:    2.40 cm 1.50 cm/m RA Area:   11.30 cm  LA Vol (A2C):  44.0 ml 27.46 ml/m RA Volume:  20.10 ml 12.55 ml/m  LA Vol (A4C):  48.3 ml 30.15 ml/m  LA Biplane Vol: 46.7 ml 29.15 ml/m  AORTIC VALVE  LVOT Vmax:  117.00 cm/s  LVOT Vmean: 80.300 cm/s  LVOT VTI:  0.285 m    AORTA  Ao Root diam: 2.90 cm  Ao Asc diam: 2.60 cm   MITRAL VALVE        TRICUSPID VALVE  MV Area (PHT): 3.77 cm  TR Peak grad:  28.9 mmHg  MV Decel Time: 201 msec  TR Vmax:    269.00 cm/s  MV E velocity: 93.60 cm/s  MV A velocity: 63.50 cm/s SHUNTS  MV E/A ratio: 1.47    Systemic VTI: 0.29 m               Systemic Diam: 1.90 cm   Subjective: Seen and examined at bedside and she is back to her baseline and had no more nausea vomiting.  Ready to go home denies any lightheadedness, chest pain or dizziness.  Felt well and back to her baseline.  Neurology cleared her for discharge and will be discharging her on Pradaxa and  atorvastatin.  She was advised to follow-up with neurology in 6 weeks and she understands agrees with plan of care.  Discharge Exam: Vitals:   10/31/20 0458 10/31/20 1135  BP: (!) 159/74 (!) 155/64  Pulse: 60 77  Resp: 18 15  Temp: 98.7 F (37.1 C) 98.8 F (37.1 C)  SpO2: 95% 97%   Vitals:   10/30/20 2006 10/31/20 0044 10/31/20 0458 10/31/20 1135  BP: (!) 176/71 121/68 (!) 159/74 (!) 155/64  Pulse: 62 (!) 55 60 77  Resp: 18 16 18 15   Temp: 98.3 F (36.8 C) 98.7 F (37.1 C) 98.7 F (37.1 C) 98.8 F (37.1 C)  TempSrc: Oral Oral Oral Oral  SpO2: 95% 96% 95% 97%  Weight:      Height:       General: Pt is alert, awake, not in acute distress Cardiovascular: RRR, S1/S2 +, no rubs, no gallops Respiratory: Diminished bilaterally, no wheezing, no rhonchi; unlabored breathing Abdominal: Soft, NT, ND, bowel sounds + Extremities: no edema, no cyanosis  The results of significant diagnostics from this hospitalization (including imaging, microbiology, ancillary and laboratory) are listed below for reference.    Microbiology: Recent Results (from the past 240 hour(s))  Resp Panel by RT-PCR (Flu A&B, Covid) Nasopharyngeal Swab     Status: None   Collection Time: 10/28/20 10:46 AM   Specimen: Nasopharyngeal Swab; Nasopharyngeal(NP) swabs in vial transport medium  Result Value Ref Range Status   SARS Coronavirus 2 by RT PCR NEGATIVE NEGATIVE Final    Comment: (NOTE) SARS-CoV-2 target nucleic acids are NOT DETECTED.  The SARS-CoV-2 RNA is generally detectable in upper respiratory specimens during the acute phase of infection. The lowest concentration of SARS-CoV-2 viral copies this assay can detect is 138 copies/mL. A negative result does not preclude SARS-Cov-2 infection and should not be used as the sole basis for treatment or other patient management decisions. A negative result may occur with  improper specimen collection/handling, submission of specimen other than nasopharyngeal  swab, presence of viral mutation(s) within the areas targeted by this assay, and inadequate number of viral copies(<138 copies/mL). A negative result must be combined with clinical observations, patient history, and epidemiological information. The expected result is Negative.  Fact Sheet for Patients:  EntrepreneurPulse.com.au  Fact Sheet for Healthcare Providers:  IncredibleEmployment.be  This test is no t yet approved or cleared by the [place] and  has been authorized for detection and/or diagnosis of SARS-CoV-2 by FDA under an Emergency Use Authorization (EUA). This EUA will remain  in effect (meaning this test can be used) for the duration of the COVID-19 declaration under Section 564(b)(1) of the Act, 21 U.S.C.section 360bbb-3(b)(1), unless the authorization is terminated  or revoked sooner.       Influenza A by PCR NEGATIVE NEGATIVE Final   Influenza B by PCR NEGATIVE NEGATIVE Final    Comment: (NOTE) The Xpert Xpress SARS-CoV-2/FLU/RSV plus assay is intended as an aid in the diagnosis of influenza from Nasopharyngeal swab specimens and should not be used as a sole basis for treatment. Nasal washings and aspirates are unacceptable for Xpert Xpress SARS-CoV-2/FLU/RSV testing.  Fact Sheet for Patients: EntrepreneurPulse.com.au  Fact Sheet for Healthcare Providers: IncredibleEmployment.be  This test is not yet approved or cleared by the [place] FDA and has been authorized for detection and/or diagnosis of SARS-CoV-2 by FDA under an Emergency Use Authorization (EUA). This EUA will remain in effect (meaning this test can be used) for the duration of the COVID-19 declaration under Section 564(b)(1) of the Act, 21 U.S.C. section 360bbb-3(b)(1), unless the authorization is terminated or revoked.  Performed at [facility], [place] [address] [place], [place] [address]    Labs: BNP  (last 3 results) No results for input(s): BNP in the last 8760 hours. Basic Metabolic Panel: Recent Labs  Lab 10/28/20 0818 10/29/20 0236 10/30/20 0123 10/31/20 0204  NA 136 137 136 137  K 3.1* 4.1 3.8 3.7  CL 104 104 103 103  CO2 21* 23 27 23   GLUCOSE 125* 99 100* 93  BUN 14 15 12 8   CREATININE 1.37* 1.24* 1.23* 1.18*  CALCIUM 8.9 8.5* 8.2* 8.3*  MG  --   --  2.0 1.9  PHOS  --   --  2.6 2.7   Liver Function Tests: Recent Labs  Lab 10/29/20 0236 10/30/20 0123 10/31/20 0204  AST 19 16 17   ALT 12 10 10   ALKPHOS 33* 33* 41  BILITOT 0.7 0.7 0.9  PROT 6.5 6.0* 6.5  ALBUMIN 2.8* 2.6* 2.8*   No results for input(s): LIPASE, AMYLASE in the last 168 hours. No results for input(s): AMMONIA in the last 168 hours. CBC: Recent Labs  Lab 10/28/20 0818 10/29/20 0236 10/30/20 0123 10/31/20 0204  WBC 10.9* 8.9 7.6 6.4  NEUTROABS  --   --  5.5 4.4  HGB 11.8* 10.3* 10.2* 11.0*  HCT 35.7* 32.7* 31.8* 34.4*  MCV 90.6 90.6 89.1 89.6  PLT 102* 81* 83* 78*   Cardiac Enzymes: No results for input(s): CKTOTAL, CKMB, CKMBINDEX, TROPONINI in the last 168 hours. BNP: Invalid input(s): POCBNP CBG: Recent Labs  Lab 10/28/20 0823  GLUCAP 120*   D-Dimer No results for input(s): DDIMER in the last 72 hours. Hgb A1c No results for input(s): HGBA1C in the last 72 hours. Lipid Profile No results for input(s): CHOL, HDL, LDLCALC, TRIG, CHOLHDL, LDLDIRECT in the last 72 hours. Thyroid function studies No results for input(s): TSH, T4TOTAL, T3FREE, THYROIDAB in the last 72 hours.  Invalid input(s): FREET3 Anemia work up Recent Labs    10/31/20 0204  VITAMINB12 233  FOLATE 22.0  FERRITIN 13  TIBC 375  IRON 23*  RETICCTPCT 1.7   Urinalysis    Component Value Date/Time   COLORURINE YELLOW 10/28/2020 [place]  10/28/2020 1420   LABSPEC 1.013 10/28/2020 1420   PHURINE 5.0 10/28/2020 1420   GLUCOSEU NEGATIVE 10/28/2020 1420   HGBUR NEGATIVE 10/28/2020 1420    BILIRUBINUR NEGATIVE 10/28/2020 1420   KETONESUR 5 (A) 10/28/2020 1420   PROTEINUR NEGATIVE 10/28/2020 1420   NITRITE NEGATIVE 10/28/2020 1420   LEUKOCYTESUR NEGATIVE 10/28/2020 1420   Sepsis Labs Invalid input(s): PROCALCITONIN,  WBC,  LACTICIDVEN Microbiology Recent Results (from the past 240 hour(s))  Resp Panel by RT-PCR (Flu A&B, Covid) Nasopharyngeal Swab     Status: None   Collection Time: 10/28/20 10:46 AM   Specimen: Nasopharyngeal Swab; Nasopharyngeal(NP) swabs in vial transport medium  Result Value Ref Range Status   SARS Coronavirus 2 by RT PCR NEGATIVE NEGATIVE Final    Comment: (NOTE) SARS-CoV-2 target nucleic acids are NOT DETECTED.  The SARS-CoV-2 RNA is generally detectable in upper respiratory specimens during the acute phase of infection. The lowest concentration of SARS-CoV-2 viral copies this assay can detect is 138 copies/mL. A negative result does not preclude SARS-Cov-2 infection and should not be used as the sole basis for treatment or other patient management decisions. A negative result may occur with  improper specimen collection/handling, submission of specimen other than nasopharyngeal swab, presence of viral mutation(s) within the areas targeted by this assay, and inadequate number of viral copies(<138 copies/mL). A negative result must be combined with clinical observations, patient history, and epidemiological information. The expected result is Negative.  Fact Sheet for Patients:  BloggerCourse.com  Fact Sheet for Healthcare Providers:  SeriousBroker.it  This test is no t yet approved or cleared by the [place] FDA and  has been authorized for detection and/or diagnosis of SARS-CoV-2 by FDA under an Emergency Use Authorization (EUA). This EUA will remain  in effect (meaning this test can be used) for the duration of the COVID-19 declaration under Section 564(b)(1) of the Act,  21 U.S.C.section 360bbb-3(b)(1), unless the authorization is terminated  or revoked sooner.       Influenza A by PCR NEGATIVE NEGATIVE Final   Influenza B by PCR NEGATIVE NEGATIVE Final    Comment: (NOTE) The Xpert Xpress SARS-CoV-2/FLU/RSV plus assay is intended as an aid in the diagnosis of influenza from Nasopharyngeal swab specimens and should not be used as a sole basis for treatment. Nasal washings and aspirates are unacceptable for Xpert Xpress SARS-CoV-2/FLU/RSV testing.  Fact Sheet for Patients: BloggerCourse.com  Fact Sheet for Healthcare Providers: SeriousBroker.it  This test is not yet approved or cleared by the [place] FDA and has been authorized for detection and/or diagnosis of SARS-CoV-2 by FDA under an Emergency Use Authorization (EUA). This EUA will remain in effect (meaning this test can be used) for the duration of the COVID-19 declaration under Section 564(b)(1) of the Act, 21 U.S.C. section 360bbb-3(b)(1), unless the authorization is terminated or revoked.  Performed at [facility], [address], [place], [place] [address]    Time coordinating discharge: 35 minutes  SIGNED:  Merlene Laughter, DO [facility] 10/31/2020, 4:58 PM Pager is on AMION  If 7PM-7AM, please contact night-coverage [email or web address]

## 2020-10-31 NOTE — Progress Notes (Signed)
Patient was discharged prior to be seen today.

## 2020-10-31 NOTE — Progress Notes (Signed)
ANTICOAGULATION CONSULT NOTE Pharmacy Consult:  Heparin Indication:  Venous sinus thrombus   Allergies  Allergen Reactions  . Doxycycline Nausea And Vomiting    Patient Measurements: Height: 5\' 2"  (157.5 cm) Weight: 59.9 kg (132 lb) IBW/kg (Calculated) : 50.1 Heparin Dosing Weight: 59 kg  Vital Signs: Temp: 98.7 F (37.1 C) (01/02 0044) Temp Source: Oral (01/02 0044) BP: 121/68 (01/02 0044) Pulse Rate: 55 (01/02 0044)  Labs: Recent Labs    10/29/20 0236 10/29/20 1200 10/30/20 0123 10/31/20 0204  HGB 10.3*  --  10.2* 11.0*  HCT 32.7*  --  31.8* 34.4*  PLT 81*  --  83* 78*  HEPARINUNFRC 0.30 0.34 0.30 0.23*  CREATININE 1.24*  --  1.23* 1.18*    Estimated Creatinine Clearance: 44.1 mL/min (A) (by C-G formula based on SCr of 1.18 mg/dL (H)).   Assessment: 55 yo female with venous sinus thrombosis for heparin.    Goal of Therapy:  Heparin level 0.3-0.5 units/ml Monitor platelets by anticoagulation protocol: Yes   Plan:  Increase Heparin 950 units/hr  44, PharmD, BCPS  10/31/2020 3:28 AM

## 2020-11-01 ENCOUNTER — Telehealth: Payer: Self-pay

## 2020-11-01 LAB — BETA-2-GLYCOPROTEIN I ABS, IGG/M/A
Beta-2 Glyco I IgG: <9 GPI IgG units (ref 0–20)
Beta-2-Glycoprotein I IgA: <9 GPI IgA units (ref 0–25)
Beta-2-Glycoprotein I IgM: <9 GPI IgM units (ref 0–32)

## 2020-11-01 LAB — CARDIOLIPIN ANTIBODIES, IGG, IGM, IGA
Anticardiolipin IgA: <9 APL U/mL (ref 0–11)
Anticardiolipin IgG: <9 GPL U/mL (ref 0–14)
Anticardiolipin IgM: <9 MPL U/mL (ref 0–12)

## 2020-11-01 LAB — PROTEIN C, TOTAL: Protein C, Total: 142 % (ref 60–150)

## 2020-11-01 NOTE — Telephone Encounter (Signed)
1st attempt TCM call. No answer. 

## 2020-11-02 ENCOUNTER — Other Ambulatory Visit: Payer: Self-pay | Admitting: Nurse Practitioner

## 2020-11-02 ENCOUNTER — Telehealth: Payer: Self-pay

## 2020-11-02 ENCOUNTER — Telehealth: Payer: Self-pay | Admitting: Nurse Practitioner

## 2020-11-02 LAB — PROTHROMBIN GENE MUTATION

## 2020-11-02 LAB — FACTOR 5 LEIDEN

## 2020-11-02 MED ORDER — TOPIRAMATE 25 MG PO TABS
25.0000 mg | ORAL_TABLET | Freq: Two times a day (BID) | ORAL | 2 refills | Status: DC
Start: 2020-11-02 — End: 2020-11-08

## 2020-11-02 MED ORDER — TOPIRAMATE 25 MG PO TABS
25.0000 mg | ORAL_TABLET | Freq: Every day | ORAL | 2 refills | Status: DC
Start: 2020-11-02 — End: 2020-11-08

## 2020-11-02 NOTE — Telephone Encounter (Signed)
Patient called with complains of 10/10 HA, compared with 5/10 HA yesterday. Taking tylenol PRN. Discussed meds options with her. Will add topamax  25 bid. Dr. Pearlean Brownie will see in 2 weeks, arranged with office by him. Rx called in to [facility].  Annie Main, NP Advanced Practice Stroke Nurse

## 2020-11-02 NOTE — Telephone Encounter (Signed)
Transition Care Management Follow-up Telephone Call  Date of discharge and from where: 10/31/2020-[facility]  How have you been since you were released from the hospital?  [facility] but having a headache today which she was told to expect  Any questions or concerns? No  Items Reviewed:  Did the pt receive and understand the discharge instructions provided? Yes   Medications obtained and verified? Yes   Other? Yes   Any new allergies since your discharge? No   Dietary orders reviewed? Yes  Do you have support at home? Yes   Home Care and Equipment/Supplies: Were home health services ordered? no If so, what is the name of the agency? n/a  Has the agency set up a time to come to the patient's home? not applicable Were any new equipment or medical supplies ordered?  No What is the name of the medical supply agency? n/a Were you able to get the supplies/equipment? not applicable Do you have any questions related to the use of the equipment or supplies? n/a  Functional Questionnaire: (I = Independent and D = Dependent) ADLs: I  Bathing/Dressing- I  Meal Prep- I  Eating- I  Maintaining continence- I  Transferring/Ambulation- I  Managing Meds- I  Follow up appointments reviewed:   PCP Hospital f/u appt confirmed? Yes  Scheduled to see Dr. Claiborne Billings on 11/12/20 @ 11:30.  Specialist Hospital f/u appt confirmed? Yes  Scheduled to see Dr. Pearlean Brownie on 11/08/20 @ 1:00.  Are transportation arrangements needed? No   If their condition worsens, is the pt aware to call PCP or go to the Emergency Dept.? Yes  Was the patient provided with contact information for the PCP's office or ED? Yes  Was to pt encouraged to call back with questions or concerns? Yes

## 2020-11-08 ENCOUNTER — Encounter: Payer: Self-pay | Admitting: Neurology

## 2020-11-08 ENCOUNTER — Other Ambulatory Visit: Payer: Self-pay

## 2020-11-08 ENCOUNTER — Encounter: Payer: Self-pay | Admitting: Emergency Medicine

## 2020-11-08 ENCOUNTER — Ambulatory Visit: Payer: 59 | Admitting: Neurology

## 2020-11-08 VITALS — BP 134/79 | HR 63 | Ht 62.0 in | Wt 127.4 lb

## 2020-11-08 DIAGNOSIS — G08 Intracranial and intraspinal phlebitis and thrombophlebitis: Secondary | ICD-10-CM

## 2020-11-08 DIAGNOSIS — G441 Vascular headache, not elsewhere classified: Secondary | ICD-10-CM

## 2020-11-08 DIAGNOSIS — I82890 Acute embolism and thrombosis of other specified veins: Secondary | ICD-10-CM | POA: Diagnosis not present

## 2020-11-08 MED ORDER — TOPIRAMATE 50 MG PO TABS: 50.0000 mg | ORAL_TABLET | Freq: Two times a day (BID) | ORAL | 2 refills | Status: DC

## 2020-11-08 NOTE — Patient Instructions (Signed)
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  She has been started on Pradaxa but continues to complain of severe headache with some gait imbalance hence I recommend repeating MRI scan of the brain with MR venogram of the brain and if there is no stabilization or improvement in the clot may consider switching Pradaxa to Coumadin.  I recommend she increase the dose of Topamax to 50 mg twice daily for the headaches and to maintain aggressive hydration and drink at least 8 to 10   glasses of liquids a day.  She was also advised to stay off the estrogen.  She will return for follow-up in 2 months or call earlier if necessary.

## 2020-11-08 NOTE — Progress Notes (Signed)
[facility] [address] [place]. [place] [address] [phone]       OFFICE CONSULT NOTE  Sydney [name] Date of Birth:  May 04, 1966 Medical Record Number:  [id number]   Referring MD: [place]  Reason for Referral: Sagittal sinus thrombosis  HPI: Sydney [name] is a 55 year old Caucasian lady seen today for initial office consultation visit for cerebral venous sinus thrombosis.  She is accompanied by a friend Mudlogger.  History is obtained from them, review of electronic medical records and I personally reviewed pertinent imaging films in PACS. Patient has past medical history of stage III chronic kidney disease, anemia, on hormone replacement therapy with JUNEL estrogen product for hot flashes who presented on 10/28/2020 with new onset persistent headache for 2 days.  She states she had been dealing with some sinus and left ear infection and hypersensitivity to sound for the last 4 to 5 weeks.  States she had a COVID booster shot on November 5 with Pfizer vaccine since then she has been treated with courses of cefdinir.  Followed by amoxicillin and prednisone for ear infection   without much relief.  She was also having a lot of sinus pressure and facial pain.  She underwent CT maxillofacial in the ER which showed a left sigmoid sinus and jugular vein nonocclusive thrombus.  MRI scan of the brain and subsequently obtained which was normal except abnormal signal in the superior sagittal, left transverse and sigmoid sinus.  MRI of the brain and neck were unremarkable.  2D echo showed normal ejection fraction without cardiac source of embolism.  LDL cholesterol was 143 mg and hemoglobin A1c 5.5 on 08/27/2020.  She will complete hypercoagulable panel labs  all of which  were normal.  MR venogram showed nonocclusive thrombus of the mid superior societal sinus extending posteriorly as well as thrombus within the left sigmoid and left internal jugular vein.  Patient was  initially started on IV heparin and then transition to Pradaxa 150 twice daily.  Patient states that she is still having daily persistent headaches with she describes this as a viselike grip in bifrontal and bitemporal regions and constant.  She has been taking Topamax 25 mg twice daily but has not noticed any benefit or any side effects.  She feels a left ear hearing loss has improved though she has no increased sensitivity to sound in the left ear.  Her vision is also improved.  She however feels that she is at times dragging her left leg with walking though she has had no falls or injuries.  She denies any loss of vision and difficulty reading.  She states she has been drinking lots of fluids.  She has stopped using the estrogen hormone replacement medication.  She denies any prior history of deep vein thrombosis, pulmonary embolism, recurrent miscarriages, strokes, TIAs, migraines or seizures.  There is no family history of abnormal clotting in the legs, lungs of the brain. ROS:   14 system review of systems is positive for ear pain, increased sensitivity to sound, headache, nausea, vomiting, dragging of the leg and all other systems negative  PMH:  Past Medical History:  Diagnosis Date  . Anemia   . Blood clots in brain   . Headache   . History of cold sores   . HPV (human papilloma virus) infection   . Post-operative nausea and vomiting     Social History:  Social History   Socioeconomic History  . Marital status: Divorced  Spouse name: Not on file  . Number of children: Not on file  . Years of education: Not on file  . Highest education level: Not on file  Occupational History  . Occupation: disability  Tobacco Use  . Smoking status: Never Smoker  . Smokeless tobacco: Never Used  Vaping Use  . Vaping Use: Never used  Substance and Sexual Activity  . Alcohol use: Never  . Drug use: Never  . Sexual activity: Not Currently  Other Topics Concern  . Not on file  Social  History Narrative   Lives with son   Right Handed   Drinks no caffeine   Social Determinants of Health   Financial Resource Strain: Not on file  Food Insecurity: Not on file  Transportation Needs: Not on file  Physical Activity: Not on file  Stress: Not on file  Social Connections: Not on file  Intimate Partner Violence: Not on file    Medications:   Current Outpatient Medications on File Prior to Visit  Medication Sig Dispense Refill  . acetaminophen (TYLENOL) 500 MG tablet Take 1,000 mg by mouth every 6 (six) hours as needed for mild pain or headache.    Marland Kitchen atorvastatin (LIPITOR) 40 MG tablet Take 1 tablet (40 mg total) by mouth daily. 30 tablet 0  . dabigatran (PRADAXA) 150 MG CAPS capsule Take 1 capsule (150 mg total) by mouth every 12 (twelve) hours. 60 capsule 0  . Multiple Vitamin (MULTIVITAMIN) tablet Take 1 tablet by mouth daily.    . valACYclovir (VALTREX) 500 MG tablet Take 500 mg by mouth 2 (two) times daily as needed (flare up).    . feeding supplement (ENSURE ENLIVE / ENSURE PLUS) LIQD Take 237 mLs by mouth 2 (two) times daily between meals. 237 mL 12  . ondansetron (ZOFRAN) 4 MG tablet Take 1 tablet (4 mg total) by mouth every 6 (six) hours as needed for nausea. 20 tablet 0  . topiramate (TOPAMAX) 25 MG tablet Take 1 tablet (25 mg total) by mouth 2 (two) times daily. 30 tablet 2   No current facility-administered medications on file prior to visit.    Allergies:   Allergies  Allergen Reactions  . Doxycycline Nausea And Vomiting    Physical Exam General: well developed, well nourished middle-aged Caucasian lady, seated, in no evident distress Head: head normocephalic and atraumatic.   Neck: supple with no carotid or supraclavicular bruits Cardiovascular: regular rate and rhythm, no murmurs Musculoskeletal: no deformity Skin:  no rash/petichiae Vascular:  Normal pulses all extremities  Neurologic Exam Mental Status: Awake and fully alert. Oriented to place  and time. Recent and remote memory intact. Attention span, concentration and fund of knowledge appropriate. Mood and affect appropriate.  Cranial Nerves: Fundoscopic exam reveals sharp disc margins. Pupils equal, briskly reactive to light. Extraocular movements full without nystagmus. Visual fields full to confrontation. Hearing intact. Facial sensation intact. Face, tongue, palate moves normally and symmetrically.  Motor: Normal bulk and tone. Normal strength in all tested extremity muscles.  Tone slightly increased on the left compared to the right. Sensory.: intact to touch , pinprick , position and vibratory sensation.  Coordination: Rapid alternating movements normal in all extremities. Finger-to-nose and heel-to-shin performed accurately bilaterally. Gait and Station: Arises from chair without difficulty. Stance is normal. Gait demonstrates normal stride length and balance with slight dragging of the left leg.. Able to heel, toe and tandem walk without difficulty.  Reflexes: 3+ brisk and asymmetric and more prominent on the left.. Toes downgoing.  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     PLAN: I had a long discussion with patient and her friend regarding her complaints of headache and nausea related to left jugular vein and superior sagittal sinus thrombosis likely triggered by combination of being on estrogen containing medications, your infection and recent COVID vaccination booster shot.  She has been started on Pradaxa but continues to complain of severe headache with some gait imbalance hence I recommend repeating MRI scan of the brain with MR venogram of the brain and if there is no stabilization or improvement in the clot may consider switching Pradaxa to Coumadin.   I recommend she increase the dose of Topamax to 50 mg twice daily for the headaches and to maintain aggressive hydration and drink at least 8 to 10   glasses of liquids a day.  Patient was advised not to return to work till her symptoms improved significantly.  She was also advised to stay off the estrogen.  Greater than 50% time during this 45-minute consultation visit was spent on counseling and coordination of care about her cerebral venous sinus thrombosis and discussion about evaluation and treatment plan and answering questions.  She will return for follow-up in 2 months or call earlier if necessary. Antony Contras, MD Note: This document was prepared with digital dictation and possible smart phrase technology. Any transcriptional errors that result from this process are unintentional.

## 2020-11-10 ENCOUNTER — Telehealth: Payer: Self-pay | Admitting: Neurology

## 2020-11-10 NOTE — Telephone Encounter (Signed)
Patient is concerned that she has now developed "floaters" in her eye as well as constant headaches and is asking if her MRA should be scheduled on an urgent basis now?

## 2020-11-10 NOTE — Telephone Encounter (Signed)
Pt. states Dr. wanted her to get a MRA of the brain & she hasn't heard anything yet. She also states she has a constant floater going on & constant headaches. Please advise.

## 2020-11-10 NOTE — Telephone Encounter (Signed)
I spoke to MRI schedulers and we will try to schedule it for tomorrow at [facility].  Meanwhile she could try increasing the dose of Topamax for headaches

## 2020-11-10 NOTE — Telephone Encounter (Signed)
Called patient back and discussed Dr. Clydene Fake recommendation.  Patient stated if she doesn't hear from [place] tomorrow she will give [place] a call.  Patient expressed appreciation.

## 2020-11-11 ENCOUNTER — Other Ambulatory Visit: Payer: Self-pay | Admitting: Neurology

## 2020-11-11 DIAGNOSIS — G08 Intracranial and intraspinal phlebitis and thrombophlebitis: Secondary | ICD-10-CM

## 2020-11-11 NOTE — Telephone Encounter (Signed)
Per [name] the Freight forwarder at [facility] have spoken to Sydney [name] and she is coming Sunday. We discussed the weather and said her son would get her here even if it snowed unless we closed, I told her I would also be on the watch for a cancellation."  [facility] auth: [phone] & [phone] (exp. 11/10/20 to 12/25/20)

## 2020-11-12 ENCOUNTER — Encounter: Payer: Self-pay | Admitting: Family Medicine

## 2020-11-12 ENCOUNTER — Ambulatory Visit: Payer: 59 | Admitting: Family Medicine

## 2020-11-12 ENCOUNTER — Other Ambulatory Visit: Payer: Self-pay

## 2020-11-12 VITALS — BP 133/96 | HR 81 | Temp 98.4°F | Ht 62.0 in | Wt 126.0 lb

## 2020-11-12 DIAGNOSIS — G08 Intracranial and intraspinal phlebitis and thrombophlebitis: Secondary | ICD-10-CM

## 2020-11-12 DIAGNOSIS — Z7901 Long term (current) use of anticoagulants: Secondary | ICD-10-CM

## 2020-11-12 DIAGNOSIS — I82C12 Acute embolism and thrombosis of left internal jugular vein: Secondary | ICD-10-CM | POA: Insufficient documentation

## 2020-11-12 DIAGNOSIS — Z9289 Personal history of other medical treatment: Secondary | ICD-10-CM

## 2020-11-12 DIAGNOSIS — N1831 Chronic kidney disease, stage 3a: Secondary | ICD-10-CM

## 2020-11-12 LAB — CBC WITH DIFFERENTIAL/PLATELET
Basophils Absolute: 0 10*3/uL (ref 0.0–0.1)
Basophils Relative: 0.8 % (ref 0.0–3.0)
Eosinophils Absolute: 0 10*3/uL (ref 0.0–0.7)
Eosinophils Relative: 0.8 % (ref 0.0–5.0)
HCT: 36.9 % (ref 36.0–46.0)
Hemoglobin: 12.3 g/dL (ref 12.0–15.0)
Lymphocytes Relative: 28.9 % (ref 12.0–46.0)
Lymphs Abs: 1 10*3/uL (ref 0.7–4.0)
MCHC: 33.2 g/dL (ref 30.0–36.0)
MCV: 86.3 fl (ref 78.0–100.0)
Monocytes Absolute: 0.2 10*3/uL (ref 0.1–1.0)
Monocytes Relative: 7.1 % (ref 3.0–12.0)
Neutro Abs: 2.2 10*3/uL (ref 1.4–7.7)
Neutrophils Relative %: 62.4 % (ref 43.0–77.0)
Platelets: 150 10*3/uL (ref 150.0–400.0)
RBC: 4.28 Mil/uL (ref 3.87–5.11)
RDW: 14.7 % (ref 11.5–15.5)
WBC: 3.5 10*3/uL — ABNORMAL LOW (ref 4.0–10.5)

## 2020-11-12 MED ORDER — DABIGATRAN ETEXILATE MESYLATE 150 MG PO CAPS: 150.0000 mg | ORAL_CAPSULE | Freq: Two times a day (BID) | ORAL | 5 refills | Status: DC

## 2020-11-12 MED ORDER — ATORVASTATIN CALCIUM 40 MG PO TABS: 40.0000 mg | ORAL_TABLET | Freq: Every day | ORAL | 3 refills | Status: DC

## 2020-11-12 MED ORDER — VALACYCLOVIR HCL 1 G PO TABS
1000.0000 mg | ORAL_TABLET | Freq: Two times a day (BID) | ORAL | 0 refills | Status: DC
Start: 2020-11-12 — End: 2021-03-23

## 2020-11-12 NOTE — Progress Notes (Signed)
[place] , April 25, 1966, 55 y.o., female MRN: [id number] Patient Care Team    Relationship Specialty Notifications Start End  [name], DO PCP - General Family Medicine  12/03/18   [name], MD Consulting Physician Obstetrics and Gynecology  12/03/18     Chief Complaint  Patient presents with  . Hospitalization Follow-up    Hos f/u 12/30- 1/2; Already followed with Neuro; pt has recently called Neuro in regards to floaters in L eye; Pt has also started experiencing numbness in toes starting this morning; Pt has questions about dosage for Topamax      Subjective:  Sydney [name]  is a 55 y.o. female presents for hospital follow up after recent admission on 10/28/2020 for primary diagnosis cerebral venous sinus thrombus. Patient was discharged on 10/31/2020 to home. Patients discharge summary has been reviewed, as well as all labs/image studies obtained during hospitalization.   Patients hospital course: Patient presented to the ED with headache and left-sided neck pain for about 3 weeks duration.  When she suddenly developed nausea and some dizziness.  She did have a few episodes of vomiting and felt like she is going to pass out.  She then asked her friend to take her to the emergency room for evaluation.  Prior to the onset she had been treated, not by her PCP, will for a sinus infection and an ear infection with multiple rounds of antibiotic amoxicillin, then cefdinir and also prednisone.  Her anticoagulation work-up was normal.  She has followed with neurology since being discharged. She has a history of mild kidney dysfunction, which she attributed secondary to rhabdomyolysis sustained when she was a runner a few years prior.  She has had decreased albumin, increased protein that led to a further evaluation in which her iron panel was collected and was significantly higher than her normal without iron supplement.  Upon repeat of her iron panel it was back to  her normal.  SPEP had been completed 09/28/2028 for further evaluation with increased globulin of 4.0 and increased alpha-1 0.5.  Negative for M spike. Since hospital discharge patient reports patient reports she is improving every day but she is still having symptoms.  She is fatigued.  She is still having facial sinus pressure and swelling.  Her ears are still painful.  She is eating and drinking okay.  She feels winded at times but her oxygen saturations have maintained normal.  She has not had a fever or cough.  She denies any calf pain.  She has been compliant with her Pradaxa twice daily without any missed doses.  She does have a cold sore that will not go away.  He is tolerating her statin start of Lipitor 40 mg daily.  She has followed with neurology.    CT HEAD WO CONTRAST  Result Date: 10/28/2020 CLINICAL DATA:  Headache, nausea. EXAM: CT HEAD WITHOUT CONTRAST TECHNIQUE: Contiguous axial images were obtained from the base of the skull through the vertex without intravenous contrast. COMPARISON:  None. FINDINGS: Brain: No evidence of acute infarction, hemorrhage, hydrocephalus, extra-axial collection or mass lesion/mass effect. Vascular: No hyperdense vessel or unexpected calcification. Skull: Normal. Negative for fracture or focal lesion. Sinuses/Orbits: No acute finding. Other: None. IMPRESSION: Normal head CT. Electronically Signed   By: Marijo Conception M.D.   On: 10/28/2020 09:28   MR ANGIO HEAD WO CONTRAST  Result Date: 10/29/2020 CLINICAL DATA:  Headache EXAM: MRI HEAD WITHOUT CONTRAST MRA HEAD WITHOUT  CONTRAST MRA NECK WITHOUT CONTRAST TECHNIQUE: Multiplanar, multiecho pulse sequences of the brain and surrounding structures were obtained without intravenous contrast. Angiographic images of the Circle of Willis were obtained using MRA technique without intravenous contrast. Angiographic images of the neck were obtained using MRA technique without intravenous contrast. Carotid stenosis  measurements (when applicable) are obtained utilizing NASCET criteria, using the distal internal carotid diameter as the denominator. COMPARISON:  MRV brain 10/28/2020 FINDINGS: MRI HEAD FINDINGS Brain: No acute infarct, mass effect or extra-axial collection. No acute or chronic hemorrhage. Normal white matter signal, parenchymal volume and CSF spaces. The midline structures are normal. Vascular: Abnormal signal within the superior sagittal sinus, left transverse sinus, left sigmoid sinus, compatible with recently demonstrated dural venous thrombosis. Skull and upper cervical spine: Normal calvarium and skull base. Visualized upper cervical spine and soft tissues are normal. Sinuses/Orbits:No paranasal sinus fluid levels or advanced mucosal thickening. No mastoid or middle ear effusion. Normal orbits. MRA HEAD FINDINGS POSTERIOR CIRCULATION: --Vertebral arteries: Normal --Inferior cerebellar arteries: Normal. --Basilar artery: Normal. --Superior cerebellar arteries: Normal. --Posterior cerebral arteries: Normal. ANTERIOR CIRCULATION: --Intracranial internal carotid arteries: Normal. --Anterior cerebral arteries (ACA): Normal. --Middle cerebral arteries (MCA): Normal. ANATOMIC VARIANTS: Fetal origin of the right PCA. MRA NECK FINDINGS Normal 3 vessel branching pattern of the aortic arch. Vertebral arteries are codominant. Both vertebral arteries are normal from their origin to the skull base. There is no carotid stenosis or occlusion. IMPRESSION: 1. Abnormal signal within the superior sagittal sinus, left transverse sinus, left sigmoid sinus, compatible with recently demonstrated dural venous thrombosis. 2. No acute ischemia or hemorrhage. 3. Normal MRA of the head and neck. Electronically Signed   By: Ulyses Jarred M.D.   On: 10/29/2020 02:58   MR ANGIO NECK WO CONTRAST  Result Date: 10/29/2020 CLINICAL DATA:  Headache EXAM: MRI HEAD WITHOUT CONTRAST MRA HEAD WITHOUT CONTRAST MRA NECK WITHOUT CONTRAST  TECHNIQUE: Multiplanar, multiecho pulse sequences of the brain and surrounding structures were obtained without intravenous contrast. Angiographic images of the Circle of Willis were obtained using MRA technique without intravenous contrast. Angiographic images of the neck were obtained using MRA technique without intravenous contrast. Carotid stenosis measurements (when applicable) are obtained utilizing NASCET criteria, using the distal internal carotid diameter as the denominator. COMPARISON:  MRV brain 10/28/2020 FINDINGS: MRI HEAD FINDINGS Brain: No acute infarct, mass effect or extra-axial collection. No acute or chronic hemorrhage. Normal white matter signal, parenchymal volume and CSF spaces. The midline structures are normal. Vascular: Abnormal signal within the superior sagittal sinus, left transverse sinus, left sigmoid sinus, compatible with recently demonstrated dural venous thrombosis. Skull and upper cervical spine: Normal calvarium and skull base. Visualized upper cervical spine and soft tissues are normal. Sinuses/Orbits:No paranasal sinus fluid levels or advanced mucosal thickening. No mastoid or middle ear effusion. Normal orbits. MRA HEAD FINDINGS POSTERIOR CIRCULATION: --Vertebral arteries: Normal --Inferior cerebellar arteries: Normal. --Basilar artery: Normal. --Superior cerebellar arteries: Normal. --Posterior cerebral arteries: Normal. ANTERIOR CIRCULATION: --Intracranial internal carotid arteries: Normal. --Anterior cerebral arteries (ACA): Normal. --Middle cerebral arteries (MCA): Normal. ANATOMIC VARIANTS: Fetal origin of the right PCA. MRA NECK FINDINGS Normal 3 vessel branching pattern of the aortic arch. Vertebral arteries are codominant. Both vertebral arteries are normal from their origin to the skull base. There is no carotid stenosis or occlusion. IMPRESSION: 1. Abnormal signal within the superior sagittal sinus, left transverse sinus, left sigmoid sinus, compatible with recently  demonstrated dural venous thrombosis. 2. No acute ischemia or hemorrhage. 3. Normal MRA of the head  and neck. Electronically Signed   By: Ulyses Jarred M.D.   On: 10/29/2020 02:58   MR BRAIN WO CONTRAST  Result Date: 10/29/2020 CLINICAL DATA:  Headache EXAM: MRI HEAD WITHOUT CONTRAST MRA HEAD WITHOUT CONTRAST MRA NECK WITHOUT CONTRAST TECHNIQUE: Multiplanar, multiecho pulse sequences of the brain and surrounding structures were obtained without intravenous contrast. Angiographic images of the Circle of Willis were obtained using MRA technique without intravenous contrast. Angiographic images of the neck were obtained using MRA technique without intravenous contrast. Carotid stenosis measurements (when applicable) are obtained utilizing NASCET criteria, using the distal internal carotid diameter as the denominator. COMPARISON:  MRV brain 10/28/2020 FINDINGS: MRI HEAD FINDINGS Brain: No acute infarct, mass effect or extra-axial collection. No acute or chronic hemorrhage. Normal white matter signal, parenchymal volume and CSF spaces. The midline structures are normal. Vascular: Abnormal signal within the superior sagittal sinus, left transverse sinus, left sigmoid sinus, compatible with recently demonstrated dural venous thrombosis. Skull and upper cervical spine: Normal calvarium and skull base. Visualized upper cervical spine and soft tissues are normal. Sinuses/Orbits:No paranasal sinus fluid levels or advanced mucosal thickening. No mastoid or middle ear effusion. Normal orbits. MRA HEAD FINDINGS POSTERIOR CIRCULATION: --Vertebral arteries: Normal --Inferior cerebellar arteries: Normal. --Basilar artery: Normal. --Superior cerebellar arteries: Normal. --Posterior cerebral arteries: Normal. ANTERIOR CIRCULATION: --Intracranial internal carotid arteries: Normal. --Anterior cerebral arteries (ACA): Normal. --Middle cerebral arteries (MCA): Normal. ANATOMIC VARIANTS: Fetal origin of the right PCA. MRA NECK  FINDINGS Normal 3 vessel branching pattern of the aortic arch. Vertebral arteries are codominant. Both vertebral arteries are normal from their origin to the skull base. There is no carotid stenosis or occlusion. IMPRESSION: 1. Abnormal signal within the superior sagittal sinus, left transverse sinus, left sigmoid sinus, compatible with recently demonstrated dural venous thrombosis. 2. No acute ischemia or hemorrhage. 3. Normal MRA of the head and neck. Electronically Signed   By: Ulyses Jarred M.D.   On: 10/29/2020 02:58   MR MRV HEAD W WO CONTRAST  Result Date: 10/28/2020 CLINICAL DATA:  Headache, near syncope EXAM: MR VENOGRAM HEAD WITHOUT AND WITH CONTRAST TECHNIQUE: Angiographic images of the intracranial venous structures were obtained using MRV technique without and with intravenous contrast. CONTRAST:  45m GADAVIST GADOBUTROL 1 MMOL/ML IV SOLN COMPARISON:  None. FINDINGS: There is nonocclusive thrombus extending from the mid superior sagittal sinus posteriorly. A sliver extends posteriorly pre remainder of the superior sagittal sinus sinus is patent. Straight sinus, vein of Galen, and internal cerebral veins are patent. Right transverse and sigmoid sinuses are patent. Dominant left transverse sinus is patent. There is absent enhancement of the left sigmoid sinus and visualized left internal jugular vein. There is no abnormal intracranial enhancement. 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 Thrombus within the left sigmoid sinus and visualized left internal jugular vein. These results were called by telephone at the time of interpretation on 10/28/2020 at 5:00 pm to provider Dr. HAlmyra Free Who verbally acknowledged these results. Electronically Signed   By: PMacy MisM.D.   On: 10/28/2020 17:03   ECHOCARDIOGRAM COMPLETE  Result Date: 10/29/2020    ECHOCARDIOGRAM REPORT   Patient Name:   Sydney [name] Date of Exam: 10/29/2020 Medical Rec  #:  [id number]           Height:       62.0 in Accession #:    [id number]          Weight:       132.0  lb Date of Birth:  [birth year]           BSA:          1.602 m Patient Age:    28 years             BP:           131/72 mmHg Patient Gender: F                    HR:           47 bpm. Exam Location:  Inpatient Procedure: 2D Echo, Color Doppler and Cardiac Doppler Indications:    Stroke 434.91 / I163.9  History:        Patient has no prior history of Echocardiogram examinations.  Sonographer:    Bernadene Person RDCS Referring Phys: [id number] [place]  1. Left ventricular ejection fraction, by estimation, is 55 to 60%. The left ventricle has normal function. The left ventricle has no regional wall motion abnormalities. Left ventricular diastolic parameters were normal.  2. Right ventricular systolic function is normal. The right ventricular size is normal. There is normal pulmonary artery systolic pressure. The estimated right ventricular systolic pressure is 97.6 mmHg.  3. The mitral valve is normal in structure. No evidence of mitral valve regurgitation. No evidence of mitral stenosis.  4. The aortic valve is tricuspid. Aortic valve regurgitation is not visualized. No aortic stenosis is present.  5. The inferior vena cava is normal in size with greater than 50% respiratory variability, suggesting right atrial pressure of 3 mmHg. FINDINGS  Left Ventricle: Left ventricular ejection fraction, by estimation, is 55 to 60%. The left ventricle has normal function. The left ventricle has no regional wall motion abnormalities. The left ventricular internal cavity size was normal in size. There is  no left ventricular hypertrophy. Left ventricular diastolic parameters were normal. Right Ventricle: The right ventricular size is normal. No increase in right ventricular wall thickness. Right ventricular systolic function is normal. There is normal pulmonary artery systolic pressure. The tricuspid regurgitant  velocity is 2.69 m/s, and  with an assumed right atrial pressure of 3 mmHg, the estimated right ventricular systolic pressure is 73.4 mmHg. Left Atrium: Left atrial size was normal in size. Right Atrium: Right atrial size was normal in size. Pericardium: There is no evidence of pericardial effusion. Mitral Valve: The mitral valve is normal in structure. No evidence of mitral valve regurgitation. No evidence of mitral valve stenosis. Tricuspid Valve: The tricuspid valve is normal in structure. Tricuspid valve regurgitation is trivial. Aortic Valve: The aortic valve is tricuspid. Aortic valve regurgitation is not visualized. No aortic stenosis is present. Pulmonic Valve: The pulmonic valve was normal in structure. Pulmonic valve regurgitation is not visualized. Aorta: The aortic root is normal in size and structure. Venous: The inferior vena cava is normal in size with greater than 50% respiratory variability, suggesting right atrial pressure of 3 mmHg. IAS/Shunts: No atrial level shunt detected by color flow Doppler.  LEFT VENTRICLE PLAX 2D LVIDd:         3.90 cm  Diastology LVIDs:         2.70 cm  LV e' medial:    11.60 cm/s LV PW:         1.20 cm  LV E/e' medial:  8.1 LV IVS:        0.70 cm  LV e' lateral:   12.70 cm/s LVOT diam:     1.90 cm  LV E/e'  lateral: 7.4 LV SV:         81 LV SV Index:   50 LVOT Area:     2.84 cm  RIGHT VENTRICLE RV S prime:     13.10 cm/s TAPSE (M-mode): 2.8 cm LEFT ATRIUM             Index       RIGHT ATRIUM           Index LA diam:        2.40 cm 1.50 cm/m  RA Area:     11.30 cm LA Vol (A2C):   44.0 ml 27.46 ml/m RA Volume:   20.10 ml  12.55 ml/m LA Vol (A4C):   48.3 ml 30.15 ml/m LA Biplane Vol: 46.7 ml 29.15 ml/m  AORTIC VALVE LVOT Vmax:   117.00 cm/s LVOT Vmean:  80.300 cm/s LVOT VTI:    0.285 m  AORTA Ao Root diam: 2.90 cm Ao Asc diam:  2.60 cm MITRAL VALVE               TRICUSPID VALVE MV Area (PHT): 3.77 cm    TR Peak grad:   28.9 mmHg MV Decel Time: 201 msec    TR Vmax:         269.00 cm/s MV E velocity: 93.60 cm/s MV A velocity: 63.50 cm/s  SHUNTS MV E/A ratio:  1.47        Systemic VTI:  0.29 m                            Systemic Diam: 1.90 cm Loralie Champagne MD Electronically signed by Loralie Champagne MD Signature Date/Time: 10/29/2020/12:53:25 PM    Final    CT MAXILLOFACIAL WO CONTRAST  Result Date: 10/28/2020 CLINICAL DATA:  Maxillofacial pain. Headache. 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 EXAM: CT MAXILLOFACIAL WITHOUT CONTRAST TECHNIQUE: Multidetector CT imaging of the maxillofacial structures was performed. Multiplanar CT image reconstructions were also generated. COMPARISON:  CT head 10/28/2020.  MR venogram 10/28/2020 FINDINGS: Osseous: Negative for fracture. No bone lesion identified. No acute dental infection Orbits: Normal orbit. No mass or edema in the orbit. Normal bony orbit. Sinuses: Frontal sinus extensive mucosal edema left clear. Frontal sinus. Right remaining paranasal sinuses clear. Mastoid sinus clear bilaterally. Soft tissues: No soft tissue mass or adenopathy.  Normal pharynx The left jugular vein is distended and ill-defined suggesting left jugular vein thrombosis. There is filling defect in the sigmoid sinus and proximal left jugular vein on MRV of the head today. Limited intracranial: Negative IMPRESSION: 1. Findings consistent with thrombus in the left jugular vein. This corresponds with filling defect in the left sigmoid sinus and jugular vein on MR venogram from today. 2. Mucosal edema left frontal sinus.  Remaining sinuses clear. Electronically Signed   By: Franchot Gallo M.D.   On: 10/28/2020 19:43     Depression screen [facility] 2/9 08/27/2020 08/20/2019 12/03/2018  Decreased Interest 0 0 0  Down, Depressed, Hopeless 0 0 0  PHQ - 2 Score 0 0 0    Allergies  Allergen Reactions  . Doxycycline Nausea And Vomiting   Social History   Tobacco Use  . Smoking status: Never Smoker   . Smokeless tobacco: Never Used  Substance Use Topics  . Alcohol use: Never   Past Medical History:  Diagnosis Date  . Anemia   . Blood clots in brain   .  Headache   . History of cold sores   . HPV (human papilloma virus) infection   . Post-operative nausea and vomiting    Past Surgical History:  Procedure Laterality Date  . BLEPHAROPLASTY  2005  . CESAREAN SECTION  2002   Family History  Problem Relation Age of Onset  . Lung cancer Maternal Grandmother   . Lung cancer Maternal Grandfather   . Colon cancer Neg Hx   . Colon polyps Neg Hx   . Esophageal cancer Neg Hx   . Rectal cancer Neg Hx   . Stomach cancer Neg Hx    Allergies as of 11/12/2020      Reactions   Doxycycline Nausea And Vomiting      Medication List       Accurate as of November 12, 2020  6:34 PM. If you have any questions, ask your nurse or doctor.        STOP taking these medications   feeding supplement Liqd Stopped by: Howard Pouch, DO   ondansetron 4 MG tablet Commonly known as: ZOFRAN Stopped by: Howard Pouch, DO     TAKE these medications   acetaminophen 500 MG tablet Commonly known as: TYLENOL Take 1,000 mg by mouth every 6 (six) hours as needed for mild pain or headache.   atorvastatin 40 MG tablet Commonly known as: LIPITOR Take 1 tablet (40 mg total) by mouth daily.   dabigatran 150 MG Caps capsule Commonly known as: PRADAXA Take 1 capsule (150 mg total) by mouth every 12 (twelve) hours.   multivitamin tablet Take 1 tablet by mouth daily.   topiramate 50 MG tablet Commonly known as: Topamax Take 1 tablet (50 mg total) by mouth 2 (two) times daily.   valACYclovir 500 MG tablet Commonly known as: VALTREX Take 500 mg by mouth 2 (two) times daily as needed (flare up). What changed: Another medication with the same name was added. Make sure you understand how and when to take each. Changed by: Howard Pouch, DO   valACYclovir 1000 MG tablet Commonly known as: Valtrex Take  1 tablet (1,000 mg total) by mouth 2 (two) times daily. What changed: You were already taking a medication with the same name, and this prescription was added. Make sure you understand how and when to take each. Changed by: Howard Pouch, DO       All past medical history, surgical history, allergies, family history, immunizations and medications were updated in the EMR today and reviewed under the history and medication portions of their EMR.      ROS: Negative, with the exception of above mentioned in HPI   Objective:  BP (!) 133/96   Pulse 81   Temp 98.4 F (36.9 C) (Oral)   Ht 5' 2"  (1.575 m)   Wt 126 lb (57.2 kg)   SpO2 100%   BMI 23.05 kg/m  Body mass index is 23.05 kg/m. Gen: Afebrile. No acute distress. Nontoxic in appearance, well developed, well nourished.  HENT: AT. [place]. Bilateral TM visualized without erythema or bulging membranes.  No effusions present.  EAC normal bilaterally. MMM, small erythemic lesion midline lower lip. Bilateral nares without erythema or drainage. Throat without erythema or exudates.  No cough.  No hoarseness. Eyes:Pupils Equal Round Reactive to light, Extraocular movements intact,  Conjunctiva without redness, discharge or icterus. Neck/lymp/endocrine: Supple, mild swelling remains over left lateral neck. CV: RRR no murmur, no edema Chest: CTAB, no wheeze or crackles. Good air movement, normal resp effort.  Skin: No  rashes, purpura or petechiae.  MSK: No tenderness to palpation bilateral lower extremities.  Negative Homans bilateral lower extremities Neuro: Normal gait. PERLA. EOMi. Alert. Oriented x3  Psych: Normal affect, dress and demeanor. Normal speech. Normal thought content and judgment.  Assessment/Plan: Sydney [name] is a 55 y.o. female present for OV for Hospital discharge follow up Cerebral venous sinus thrombosis/Acute embolism and thrombosis of left internal jugular vein (HCC)/history of recent hospitalization Lengthy  discussion with patient today.  She had been on estrogen therapy through her gynecology team for a few months, which could have contributed to her blood clot.  She did have a booster shot for her COVID around that time, uncertain if this could have been contributory. There is no family history or personal history of blood clots. We discussed referral to hematology for recommendations on anticoagulation and further evaluation on possible cause. -Hypercoagulable panel reported as normal -Continue Pradaxa 150 mg twice daily -Continue atorvastatin 40 mg daily -Continue Topamax-prescribed by neurology -Continue neurology follow-ups - CBC w/Dif-during hospital stay >leukocytosis, normocytic anemia and thrombocytopenia - Ambulatory referral to Hematology -Patient is aware if she becomes short of breath, fever, tachycardic, dizzy/lightheaded or new onset cough she needs to be seen emergently for evaluation.  She reports understanding today.  Stage 3a chronic kidney disease ([place]) Hydrate. - Comp Met (CMET)> hypokalemic during hospital stay, chronic knee disease stage III - Magnesium - Phosphorus    Reviewed expectations re: course of current medical issues.  Discussed self-management of symptoms.  Outlined signs and symptoms indicating need for more acute intervention.  Patient verbalized understanding and all questions were answered.  Patient received an After-Visit Summary.  Any changes in medications were reviewed and patient was provided with updated med list with their AVS.      Orders Placed This Encounter  Procedures  . CBC w/Diff  . Comp Met (CMET)  . Magnesium  . Phosphorus  . Ambulatory referral to Hematology   Meds ordered this encounter  Medications  . atorvastatin (LIPITOR) 40 MG tablet    Sig: Take 1 tablet (40 mg total) by mouth daily.    Dispense:  90 tablet    Refill:  3  . dabigatran (PRADAXA) 150 MG CAPS capsule    Sig: Take 1 capsule (150 mg total) by mouth  every 12 (twelve) hours.    Dispense:  60 capsule    Refill:  5  . valACYclovir (VALTREX) 1000 MG tablet    Sig: Take 1 tablet (1,000 mg total) by mouth 2 (two) times daily.    Dispense:  14 tablet    Refill:  0     Note is dictated utilizing voice recognition software. Although note has been proof read prior to signing, occasional typographical errors still can be missed. If any questions arise, please do not hesitate to call for verification.   electronically signed by:  Howard Pouch, DO  [place]

## 2020-11-12 NOTE — Patient Instructions (Addendum)
Check your tylenol daily dose- make usre you are not going over 3000 mg routinely daily.    Valtrex 1000 mg every 12 hours for 7 days, then 500 mg daily.   Hematology referral placed for you today.

## 2020-11-13 LAB — COMPREHENSIVE METABOLIC PANEL
AG Ratio: 1.1 (calc) (ref 1.0–2.5)
ALT: 14 U/L (ref 6–29)
AST: 20 U/L (ref 10–35)
Albumin: 4.2 g/dL (ref 3.6–5.1)
Alkaline phosphatase (APISO): 63 U/L (ref 37–153)
BUN/Creatinine Ratio: 12 (calc) (ref 6–22)
BUN: 16 mg/dL (ref 7–25)
CO2: 20 mmol/L (ref 20–32)
Calcium: 9.9 mg/dL (ref 8.6–10.4)
Chloride: 104 mmol/L (ref 98–110)
Creat: 1.29 mg/dL — ABNORMAL HIGH (ref 0.50–1.05)
Globulin: 3.9 g/dL (calc) — ABNORMAL HIGH (ref 1.9–3.7)
Glucose, Bld: 87 mg/dL (ref 65–99)
Potassium: 3.8 mmol/L (ref 3.5–5.3)
Sodium: 136 mmol/L (ref 135–146)
Total Bilirubin: 0.4 mg/dL (ref 0.2–1.2)
Total Protein: 8.1 g/dL (ref 6.1–8.1)

## 2020-11-13 LAB — PHOSPHORUS: Phosphorus: 3.6 mg/dL (ref 2.5–4.5)

## 2020-11-13 LAB — MAGNESIUM: Magnesium: 2.2 mg/dL (ref 1.5–2.5)

## 2020-11-14 ENCOUNTER — Other Ambulatory Visit: Payer: 59

## 2020-11-16 NOTE — Telephone Encounter (Signed)
Do to the weather on Sunday GI was closed. I had an opening for [facility]. The patient is scheduled at [facility] for 11/17/20.

## 2020-11-17 ENCOUNTER — Ambulatory Visit (INDEPENDENT_AMBULATORY_CARE_PROVIDER_SITE_OTHER): Payer: 59

## 2020-11-17 DIAGNOSIS — G08 Intracranial and intraspinal phlebitis and thrombophlebitis: Secondary | ICD-10-CM | POA: Diagnosis not present

## 2020-11-17 MED ORDER — GADOBENATE DIMEGLUMINE 529 MG/ML IV SOLN
10.0000 mL | Freq: Once | INTRAVENOUS | Status: AC | PRN
  Administered 2020-11-17: 10 mL via INTRAVENOUS

## 2020-11-18 ENCOUNTER — Telehealth: Payer: Self-pay | Admitting: *Deleted

## 2020-11-18 NOTE — Telephone Encounter (Signed)
I provided the patient with the results and she verbalized understanding of the findings.

## 2020-11-18 NOTE — Telephone Encounter (Signed)
-----   Message from Garvin Fila, MD sent at 11/18/2020  4:09 PM EST ----- Kindly inform the patient that follow-up MRI scan and MRV showed expected improvement and stable appearance of the blood clot on the surface of the brain. No new or worrisome finding

## 2020-11-18 NOTE — Progress Notes (Signed)
Kindly inform the patient that follow-up MRI scan and MRV showed expected improvement and stable appearance of the blood clot on the surface of the brain. No new or worrisome finding

## 2020-11-22 ENCOUNTER — Other Ambulatory Visit: Payer: 59

## 2020-12-06 ENCOUNTER — Ambulatory Visit: Payer: 59 | Admitting: Family Medicine

## 2020-12-06 ENCOUNTER — Encounter: Payer: Self-pay | Admitting: Family Medicine

## 2020-12-06 ENCOUNTER — Other Ambulatory Visit: Payer: Self-pay

## 2020-12-06 VITALS — BP 103/59 | HR 77 | Temp 98.9°F | Ht 62.0 in | Wt 129.0 lb

## 2020-12-06 DIAGNOSIS — I82C22 Chronic embolism and thrombosis of left internal jugular vein: Secondary | ICD-10-CM | POA: Diagnosis not present

## 2020-12-06 DIAGNOSIS — G08 Intracranial and intraspinal phlebitis and thrombophlebitis: Secondary | ICD-10-CM

## 2020-12-06 DIAGNOSIS — H938X2 Other specified disorders of left ear: Secondary | ICD-10-CM

## 2020-12-06 NOTE — Progress Notes (Signed)
Sydney [name] , [birth year], 55 y.o., female MRN: [id number] Patient Care Team    Relationship Specialty Notifications Start End  Natalia Leatherwood, DO PCP - General Family Medicine  12/03/18   Levi Aland, MD Consulting Physician Obstetrics and Gynecology  12/03/18     Chief Complaint  Patient presents with  . Ear Pain    Pt c/o left ear pain with fullness, imbalance, hearing loss; denies discharge; pt was dx with sinus infection prior to COVID and clot in jugular vein      Subjective:  Sydney [name]  is a 55 y.o. female presents for left ear fullness, imbalance and hearing loss. She was diagnosed with cerebral venous sinus thrombus and LEFT internal jugular thrombosis beginning of January.   PT reports the fullness is intermittent and started last week. She reports there is no drainage, fever, chills, or ringing in her ears. She reports the pain stays the same, but is constant.  She has had muffled hearing since the onset. She has not missed any doses of pradaxa.  Since her diagnosis of internal jugular venous sinus thrombosis she has noticed increasing light sensitivity, difficulty with her balance and peripheral vision changes.  She reports these have been unchanged and her neurology team is aware of the presence of the symptoms.   MR BRAIN W WO CONTRAST  Result Date: 11/18/2020 [facility] NEUROIMAGING REPORT STUDY DATE: 11/17/20 PATIENT NAME: Sydney [name] DOB: 1966/01/16 MRN: [id number] ORDERING CLINICIAN: Micki Riley, MD CLINICAL HISTORY: 55 year old female with venous sinus thrombosis. EXAM: MR BRAIN W WO CONTRAST TECHNIQUE: MRI of the brain with and without contrast was obtained utilizing 5 mm axial slices with T1, T2, T2 flair, T2 star gradient echo and diffusion weighted views.  T1 sagittal, T2 coronal and postcontrast views in the axial and coronal plane were obtained. CONTRAST: 14ml multihance COMPARISON: 10/29/20 IMAGING SITE:  [facility] [address] (1.5 Tesla MRI) FINDINGS: No abnormal lesions are seen on diffusion-weighted views to suggest acute ischemia. The cortical sulci, fissures and cisterns are normal in size and appearance. Lateral, third and fourth ventricle are normal in size and appearance. No extra-axial fluid collections are seen. No evidence of mass effect or midline shift.  Few punctate foci of subcortical nonspecific T2 hyperintensities. No abnormal lesions are seen on post contrast views.  Slight abnormal signal / filling defect within the mid section of superior sagittal sinus, best visualized on coronal T2 views, may represent partially recanalized thrombus. Additional small filling defect noted in the left transverse sinus, may represent partially recanalized thrombus. On sagittal views the posterior fossa, pituitary gland and corpus callosum are unremarkable. No evidence of intracranial hemorrhage on gradient-echo views. The orbits and their contents, paranasal sinuses and calvarium are unremarkable.  Intracranial flow voids are present.   MRI brain (with and without) demonstrating: -Subtle filling defects noted in mid section of superior sagittal sinus and left transverse sinus, may represent partially recanalized thrombus. INTERPRETING PHYSICIAN: Suanne Marker, MD Certified in Neurology, Neurophysiology and Neuroimaging [facility] [address] [place], [place] [address] [phone]   MR MRV HEAD WO CM  Result Date: 11/18/2020 [facility] NEUROIMAGING REPORT STUDY DATE: 11/17/20 PATIENT NAME: Sydney [name] DOB: [birth year] MRN: [id number] ORDERING CLINICIAN: Micki Riley, MD CLINICAL HISTORY: 55 year old female with sinus thrombosis. EXAM: MR MRV HEAD WO CONTRAST TECHNIQUE: MR venogram of the head was obtained utilizing 2D-TOF sequences from the  skull base to the vertex.  Computerized reconstructions were obtained. CONTRAST: none  COMPARISON: 10/28/20 IMAGING SITE: [facility] [address] (1.5 Tesla MRI) FINDINGS: This study is of adequate technical quality.  Flow signal of the superior sagittal sinus has partial filling defect in the mid section, may represent partially recanalized thrombus. The right transverse and sigmoid sinuses and right internal jugular vein are hypoplastic and have no stenosis. The left transverse sinus has very subtle filling defect may represent partially recanalized thrombus.  The left sigmoid sinuses and left internal jugular vein have no stenosis.  The deep cerebral veins, great vein of Galen and straight sinus have no stenosis.   MRV head (without) demonstrating: -Partial filling defects in mid section of superior sagittal sinus and left transverse sinus, may represent partially recanalized thrombus. Similar appearance to 10/28/20. INTERPRETING PHYSICIAN: Penni Bombard, MD Certified in Neurology, Neurophysiology and Neuroimaging [facility] [address], [place], [place] [address] [phone]     Depression screen [facility] 2/9 08/27/2020 08/20/2019 12/03/2018  Decreased Interest 0 0 0  Down, Depressed, Hopeless 0 0 0  PHQ - 2 Score 0 0 0    Allergies  Allergen Reactions  . Doxycycline Nausea And Vomiting   Social History   Tobacco Use  . Smoking status: Never Smoker  . Smokeless tobacco: Never Used  Substance Use Topics  . Alcohol use: Never   Past Medical History:  Diagnosis Date  . Anemia   . Blood clots in brain   . Headache   . History of cold sores   . HPV (human papilloma virus) infection   . Post-operative nausea and vomiting    Past Surgical History:  Procedure Laterality Date  . BLEPHAROPLASTY  2005  . CESAREAN SECTION  2002   Family History  Problem Relation Age of Onset  . Lung cancer Maternal Grandmother   . Lung cancer Maternal Grandfather   . Colon cancer Neg Hx   . Colon polyps Neg Hx   . Esophageal cancer Neg  Hx   . Rectal cancer Neg Hx   . Stomach cancer Neg Hx    Allergies as of 12/06/2020      Reactions   Doxycycline Nausea And Vomiting      Medication List       Accurate as of December 06, 2020 12:32 PM. If you have any questions, ask your nurse or doctor.        acetaminophen 500 MG tablet Commonly known as: TYLENOL Take 1,000 mg by mouth every 6 (six) hours as needed for mild pain or headache.   atorvastatin 40 MG tablet Commonly known as: LIPITOR Take 1 tablet (40 mg total) by mouth daily.   dabigatran 150 MG Caps capsule Commonly known as: PRADAXA Take 1 capsule (150 mg total) by mouth every 12 (twelve) hours.   multivitamin tablet Take 1 tablet by mouth daily.   topiramate 50 MG tablet Commonly known as: Topamax Take 1 tablet (50 mg total) by mouth 2 (two) times daily.   valACYclovir 500 MG tablet Commonly known as: VALTREX Take 500 mg by mouth 2 (two) times daily as needed (flare up).   valACYclovir 1000 MG tablet Commonly known as: Valtrex Take 1 tablet (1,000 mg total) by mouth 2 (two) times daily.       All past medical history, surgical history, allergies, family history, immunizations and medications were updated in the EMR today and reviewed under the history and medication portions of their EMR.  ROS: Negative, with the exception of above mentioned in HPI   Objective:  BP (!) 103/59   Pulse 77   Temp 98.9 F (37.2 C) (Oral)   Ht 5\' 2"  (1.575 m)   Wt 129 lb (58.5 kg)   SpO2 98%   BMI 23.59 kg/m  Body mass index is 23.59 kg/m. Gen: Afebrile. No acute distress.  Nontoxic in presentation. HENT: AT. [place]. Bilateral TM visualized and normal in appearance, mild effusion of clear fluid bilateral. MMM. Bilateral nares mild redness distal left nasal septum.. Throat without erythema or exudates.  No cough.  No shortness of breath. Eyes:Pupils Equal Round Reactive to light, Extraocular movements intact,  Conjunctiva without redness, discharge or  icterus. Neck/lymp/endocrine: Supple, no lymphadenopathy, no mastoid tenderness. CV: RRR  Chest: CTAB, no wheeze or crackles Neuro:  Normal gait. PERLA. EOMi. Alert. Oriented x3 Psych: Normal affect, dress and demeanor. Normal speech. Normal thought content and judgment..    Assessment/Plan: Aanika Defoor is a 55 y.o. female present for OV for  Ear fullness, left/Cerebral venous sinus thrombosis/Chronic internal jugular vein thrombosis, left (HCC) Bilateral ear exam appears normal with the exception of very mild effusion in bilateral ears.  No erythema or bulging present of tympanic membrane.  No signs of infection.  No lymphadenopathy. Reassured patient this did not seem to be an infectious etiology.  She has not missed any doses of her Pradaxa. Encouraged her to speak with her neurologist for recommendations with her recent history of cerebral venous sinus thrombosis and her left internal jugular vein thrombosis.  She did have recent imaging studies which were reassuring.  She reports she will call her neurologist today.    Reviewed expectations re: course of current medical issues.  Discussed self-management of symptoms.  Outlined signs and symptoms indicating need for more acute intervention.  Patient verbalized understanding and all questions were answered.  Patient received an After-Visit Summary.  Any changes in medications were reviewed and patient was provided with updated med list with their AVS.      No orders of the defined types were placed in this encounter.  No orders of the defined types were placed in this encounter.    Note is dictated utilizing voice recognition software. Although note has been proof read prior to signing, occasional typographical errors still can be missed. If any questions arise, please do not hesitate to call for verification.   electronically signed by:  Howard Pouch, DO  [place]

## 2020-12-06 NOTE — Patient Instructions (Signed)
Your ear is normal- just a mild amount of fluid in both ears.  No sign of infection.    Keep your Neurologist up to date with any changing symptom also.   Try bag balm to the dry part of your nose.  Try Flonase 1 pray twice a day to help with ears if neurologist approves.

## 2020-12-10 ENCOUNTER — Other Ambulatory Visit: Payer: Self-pay | Admitting: Family

## 2020-12-10 DIAGNOSIS — G08 Intracranial and intraspinal phlebitis and thrombophlebitis: Secondary | ICD-10-CM

## 2020-12-10 DIAGNOSIS — I82C12 Acute embolism and thrombosis of left internal jugular vein: Secondary | ICD-10-CM

## 2020-12-10 DIAGNOSIS — D6859 Other primary thrombophilia: Secondary | ICD-10-CM

## 2020-12-10 DIAGNOSIS — I82C22 Chronic embolism and thrombosis of left internal jugular vein: Secondary | ICD-10-CM

## 2020-12-13 ENCOUNTER — Other Ambulatory Visit: Payer: Self-pay

## 2020-12-13 ENCOUNTER — Telehealth: Payer: Self-pay | Admitting: Family

## 2020-12-13 ENCOUNTER — Encounter: Payer: Self-pay | Admitting: Family

## 2020-12-13 ENCOUNTER — Inpatient Hospital Stay: Payer: 59 | Attending: Hematology & Oncology

## 2020-12-13 ENCOUNTER — Inpatient Hospital Stay (HOSPITAL_BASED_OUTPATIENT_CLINIC_OR_DEPARTMENT_OTHER): Payer: 59 | Admitting: Family

## 2020-12-13 VITALS — BP 124/73 | HR 66 | Temp 97.8°F | Resp 17 | Ht 62.0 in | Wt 128.1 lb

## 2020-12-13 DIAGNOSIS — G08 Intracranial and intraspinal phlebitis and thrombophlebitis: Secondary | ICD-10-CM | POA: Insufficient documentation

## 2020-12-13 DIAGNOSIS — R519 Headache, unspecified: Secondary | ICD-10-CM | POA: Diagnosis not present

## 2020-12-13 DIAGNOSIS — D6859 Other primary thrombophilia: Secondary | ICD-10-CM

## 2020-12-13 DIAGNOSIS — Z79899 Other long term (current) drug therapy: Secondary | ICD-10-CM | POA: Insufficient documentation

## 2020-12-13 DIAGNOSIS — Z801 Family history of malignant neoplasm of trachea, bronchus and lung: Secondary | ICD-10-CM | POA: Insufficient documentation

## 2020-12-13 DIAGNOSIS — Z7901 Long term (current) use of anticoagulants: Secondary | ICD-10-CM | POA: Diagnosis not present

## 2020-12-13 DIAGNOSIS — I82C12 Acute embolism and thrombosis of left internal jugular vein: Secondary | ICD-10-CM

## 2020-12-13 DIAGNOSIS — I82C22 Chronic embolism and thrombosis of left internal jugular vein: Secondary | ICD-10-CM

## 2020-12-13 LAB — CBC WITH DIFFERENTIAL (CANCER CENTER ONLY)
Abs Immature Granulocytes: 0.01 10*3/uL (ref 0.00–0.07)
Basophils Absolute: 0 10*3/uL (ref 0.0–0.1)
Basophils Relative: 1 %
Eosinophils Absolute: 0.1 10*3/uL (ref 0.0–0.5)
Eosinophils Relative: 2 %
HCT: 38.4 % (ref 36.0–46.0)
Hemoglobin: 12.4 g/dL (ref 12.0–15.0)
Immature Granulocytes: 0 %
Lymphocytes Relative: 30 %
Lymphs Abs: 1.2 10*3/uL (ref 0.7–4.0)
MCH: 28.4 pg (ref 26.0–34.0)
MCHC: 32.3 g/dL (ref 30.0–36.0)
MCV: 87.9 fL (ref 80.0–100.0)
Monocytes Absolute: 0.3 10*3/uL (ref 0.1–1.0)
Monocytes Relative: 8 %
Neutro Abs: 2.2 10*3/uL (ref 1.7–7.7)
Neutrophils Relative %: 59 %
Platelet Count: 175 10*3/uL (ref 150–400)
RBC: 4.37 MIL/uL (ref 3.87–5.11)
RDW: 15.3 % (ref 11.5–15.5)
WBC Count: 3.9 10*3/uL — ABNORMAL LOW (ref 4.0–10.5)
nRBC: 0 % (ref 0.0–0.2)

## 2020-12-13 LAB — CMP (CANCER CENTER ONLY)
ALT: 19 U/L (ref 0–44)
AST: 32 U/L (ref 15–41)
Albumin: 4.7 g/dL (ref 3.5–5.0)
Alkaline Phosphatase: 56 U/L (ref 38–126)
Anion gap: 8 (ref 5–15)
BUN: 16 mg/dL (ref 6–20)
CO2: 26 mmol/L (ref 22–32)
Calcium: 10.8 mg/dL — ABNORMAL HIGH (ref 8.9–10.3)
Chloride: 103 mmol/L (ref 98–111)
Creatinine: 1.19 mg/dL — ABNORMAL HIGH (ref 0.44–1.00)
GFR, Estimated: 55 mL/min — ABNORMAL LOW (ref >60)
Glucose, Bld: 119 mg/dL — ABNORMAL HIGH (ref 70–99)
Potassium: 3.4 mmol/L — ABNORMAL LOW (ref 3.5–5.1)
Sodium: 137 mmol/L (ref 135–145)
Total Bilirubin: 0.4 mg/dL (ref 0.3–1.2)
Total Protein: 8.2 g/dL — ABNORMAL HIGH (ref 6.5–8.1)

## 2020-12-13 LAB — LACTATE DEHYDROGENASE: LDH: 157 U/L (ref 98–192)

## 2020-12-13 NOTE — Telephone Encounter (Signed)
Appointments scheduled calendar mailed per 2/14 los

## 2020-12-13 NOTE — Progress Notes (Signed)
Hematology/Oncology Consultation   Name: Sydney [name]      MRN: [id number]    Location: Room/bed info not found  Date: 12/13/2020 Time:10:51 AM   REFERRING PHYSICIAN: Howard Pouch, DO  REASON FOR CONSULT: Cerebral venous sinus thrombosis    DIAGNOSIS:  Cerebral venous sinus thrombosis  Protein S activity deficiency (mild)  HISTORY OF PRESENT ILLNESS: Sydney [name] is a very pleasant 55 yo caucasian female with diagnosis of nonocclusive thrombus within the mid superior sagittal sinus extending posteriorly, left sigmoid sinus and visualized left internal jugular vein on 10/28/2020 MRI.  She states that after receiving her [place] booster on November 5th she developed sinusitis and a left ear infection. This did not resolve after several rounds of antibiotics and a steroid taper.  She states that she is quite active enjoying working out and running. She had worked out the morning of 10/28/2020 and then when she bent of to put a leash on her dog became nauseated and started feeling as though she was "having a stroke".  She was hospitalized and treated initially with Heparin gtt and then transition to Pradaxa PO BID. She is tolerating well. No bleeding or petechiae. She has some mild bruising.  She had been on estrogen therapy for hot flashes and night sweats which was started in summer 2021 and stopped 10/28/2020.  Protein S activity was slightly low at 57. Hyper coag panel was otherwise negative.  She has no prior history of thrombus.  She has had eye surgery as well as a C-section without any complications.  No history of miscarriage.  She states that in 2020 she was running multiple races and developed rhabdomyolysis  She is still symptomatic with headache and a vice like pressure on the top of her head. She has muffled hearing in the left ear, dizziness exacerbated by leaning forward or turning her head from side to side. She states that she also has light sensitivity leading to  double and triple vision at times and is unable to drive at night right now as a result.  She is currently followed by neurologist Dr. Leonie [name].  She is taking Topamax for the headache.  She has no knowledge of her family history.  She denies personal history of cancer.  No history of diabetes or thyroid disease.  No fever, chill, cough, rash, SOB, chest pain, palpitations, abdominal pain or changes in bowel or bladder habits.  She takes a stool softener to help with chronic constipation.  No swelling, tenderness, numbness or tingling in her extremities.  No falls or syncope to report.  She does not smoke or use recreational drugs. She rarely has a glass of wine socially.  She has maintained a good appetite and is staying well hydrated. Her weight is stable at 128 lbs. She states that she is gaining back what she lost during and right after hospitalization.   ROS: All other 10 point review of systems is negative.   PAST MEDICAL HISTORY:   Past Medical History:  Diagnosis Date  . Anemia   . Blood clots in brain   . Headache   . History of cold sores   . HPV (human papilloma virus) infection   . Post-operative nausea and vomiting     ALLERGIES: Allergies  Allergen Reactions  . Doxycycline Nausea And Vomiting      MEDICATIONS:  Current Outpatient Medications on File Prior to Visit  Medication Sig Dispense Refill  . acetaminophen (TYLENOL) 500 MG tablet Take 1,000 mg by  mouth every 6 (six) hours as needed for mild pain or headache.    Marland Kitchen atorvastatin (LIPITOR) 40 MG tablet Take 1 tablet (40 mg total) by mouth daily. 90 tablet 3  . dabigatran (PRADAXA) 150 MG CAPS capsule Take 1 capsule (150 mg total) by mouth every 12 (twelve) hours. 60 capsule 5  . Multiple Vitamin (MULTIVITAMIN) tablet Take 1 tablet by mouth daily.    Marland Kitchen topiramate (TOPAMAX) 50 MG tablet Take 1 tablet (50 mg total) by mouth 2 (two) times daily. 60 tablet 2  . valACYclovir (VALTREX) 1000 MG tablet Take 1 tablet (1,000  mg total) by mouth 2 (two) times daily. 14 tablet 0   No current facility-administered medications on file prior to visit.     PAST SURGICAL HISTORY Past Surgical History:  Procedure Laterality Date  . BLEPHAROPLASTY  2005  . CESAREAN SECTION  2002    FAMILY HISTORY: Family History  Problem Relation Age of Onset  . Lung cancer Maternal Grandmother   . Lung cancer Maternal Grandfather   . Colon cancer Neg Hx   . Colon polyps Neg Hx   . Esophageal cancer Neg Hx   . Rectal cancer Neg Hx   . Stomach cancer Neg Hx     SOCIAL HISTORY:  reports that she has never smoked. She has never used smokeless tobacco. She reports that she does not drink alcohol and does not use drugs.  PERFORMANCE STATUS: The patient's performance status is 1 - Symptomatic but completely ambulatory  PHYSICAL EXAM: Most Recent Vital Signs: Blood pressure 124/73, pulse 66, temperature 97.8 F (36.6 C), temperature source Oral, resp. rate 17, height 5\' 2"  (1.575 m), weight 128 lb 1.9 oz (58.1 kg), SpO2 100 %. BP 124/73 (BP Location: Left Arm, Patient Position: Sitting)   Pulse 66   Temp 97.8 F (36.6 C) (Oral)   Resp 17   Ht 5\' 2"  (1.575 m)   Wt 128 lb 1.9 oz (58.1 kg)   SpO2 100%   BMI 23.43 kg/m   General Appearance:    Alert, cooperative, no distress, appears stated age  Head:    Normocephalic, without obvious abnormality, atraumatic  Eyes:    PERRL, conjunctiva/corneas clear, EOM's intact, fundi    benign, both eyes        Throat:   Lips, mucosa, and tongue normal; teeth and gums normal  Neck:   Supple, symmetrical, trachea midline, no adenopathy;    thyroid:  no enlargement/tenderness/nodules; no carotid   bruit or JVD  Back:     Symmetric, no curvature, ROM normal, no CVA tenderness  Lungs:     Clear to auscultation bilaterally, respirations unlabored  Chest Wall:    No tenderness or deformity   Heart:    Regular rate and rhythm, S1 and S2 normal, no murmur, rub   or gallop     Abdomen:      Soft, non-tender, bowel sounds active all four quadrants,    no masses, no organomegaly        Extremities:   Extremities normal, atraumatic, no cyanosis or edema  Pulses:   2+ and symmetric all extremities  Skin:   Skin color, texture, turgor normal, no rashes or lesions  Lymph nodes:   Cervical, supraclavicular, and axillary nodes normal  Neurologic:   CNII-XII intact, normal strength, sensation and reflexes    throughout    LABORATORY DATA:  Results for orders placed or performed in visit on 12/13/20 (from the past 48 hour(s))  CBC  with Differential (Cancer Center Only)     Status: Abnormal   Collection Time: 12/13/20 10:25 AM  Result Value Ref Range   WBC Count 3.9 (L) 4.0 - 10.5 K/uL   RBC 4.37 3.87 - 5.11 MIL/uL   Hemoglobin 12.4 12.0 - 15.0 g/dL   HCT 38.4 36.0 - 46.0 %   MCV 87.9 80.0 - 100.0 fL   MCH 28.4 26.0 - 34.0 pg   MCHC 32.3 30.0 - 36.0 g/dL   RDW 15.3 11.5 - 15.5 %   Platelet Count 175 150 - 400 K/uL   nRBC 0.0 0.0 - 0.2 %   Neutrophils Relative % 59 %   Neutro Abs 2.2 1.7 - 7.7 K/uL   Lymphocytes Relative 30 %   Lymphs Abs 1.2 0.7 - 4.0 K/uL   Monocytes Relative 8 %   Monocytes Absolute 0.3 0.1 - 1.0 K/uL   Eosinophils Relative 2 %   Eosinophils Absolute 0.1 0.0 - 0.5 K/uL   Basophils Relative 1 %   Basophils Absolute 0.0 0.0 - 0.1 K/uL   Immature Granulocytes 0 %   Abs Immature Granulocytes 0.01 0.00 - 0.07 K/uL    Comment: Performed at [facility] at [facility], [address] [place], [place] [address]      RADIOGRAPHY: No results found.     PATHOLOGY: None  ASSESSMENT/PLAN: Ms. Calligan is a very pleasant 55 yo caucasian female with diagnosis of nonocclusive thrombus within the mid superior sagittal sinus extending posteriorly, left sigmoid sinus and visualized left internal jugular vein on 10/28/2020 MRI.  This is felt to likely be due to estrogen therapy and persistent infection post Covid booster Therapist, music).   Protein S studies are pending.  She is doing well on Pradaxa. She will continue her same regimen.  We will plan to repeat her MRI prior to her follow-up in April so we can go over at her visit.   All questions were answered and she is in agreement with the plan. She can contact our office with any questions or concerns. We can certainly see her sooner if needed.   The patient was discussed with Dr. Marin Olp and he is in agreement with the aforementioned.   Laverna Peace, NP

## 2020-12-14 LAB — PROTEIN S ACTIVITY: Protein S Activity: >280 % — ABNORMAL HIGH (ref 63–140)

## 2020-12-14 LAB — PROTEIN S, TOTAL: Protein S Ag, Total: 78 % (ref 60–150)

## 2020-12-30 ENCOUNTER — Telehealth: Payer: Self-pay | Admitting: *Deleted

## 2020-12-30 NOTE — Telephone Encounter (Signed)
Received a call from patient with requests for scheduling her MRI.  Requests documented on MRI appt

## 2021-01-07 ENCOUNTER — Telehealth: Payer: Self-pay

## 2021-01-07 NOTE — Telephone Encounter (Signed)
returned  pts call and moved her 02/09/21 appt to the pm     Sydney [name]

## 2021-01-17 ENCOUNTER — Encounter: Payer: Self-pay | Admitting: Neurology

## 2021-01-17 ENCOUNTER — Ambulatory Visit: Payer: 59 | Admitting: Neurology

## 2021-01-17 ENCOUNTER — Encounter: Payer: Self-pay | Admitting: Emergency Medicine

## 2021-01-17 VITALS — BP 131/85 | HR 88 | Ht 62.0 in | Wt 130.2 lb

## 2021-01-17 DIAGNOSIS — G08 Intracranial and intraspinal phlebitis and thrombophlebitis: Secondary | ICD-10-CM | POA: Diagnosis not present

## 2021-01-17 NOTE — Patient Instructions (Addendum)
I had a long discussion the patient with regards to her cerebral venous sinus thrombosis and discussed results of follow-up imaging studies and answered questions.  Recommend she continue Pradaxa 150 mg twice daily for a total 6 to 9 months.  I also encouraged her to maintain adequate hydration and drink 8 to 10 glasses of liquids/fluids a day.  Reduce Topamax to 25 mg twice daily since her headaches are mostly gone and if she can tolerate this for 2 weeks reduce further to once daily for 2 weeks and stop.  She will return for follow-up in the future in 3 months or call earlier if necessary.  May obtain follow-up MR venogram at that visit.

## 2021-01-17 NOTE — Progress Notes (Signed)
[facility] [address] [place]. [place] [address] [phone]       OFFICE CONSULT NOTE  Ms. Orland Dec Date of Birth:  1966-04-10 Medical Record Number:  [id number]   Referring MD: [place]  Reason for Referral: Sagittal sinus thrombosis  HPI: Initial visit 11/08/2020 Ms. Barth is a 55 year old Caucasian lady seen today for initial office consultation visit for cerebral venous sinus thrombosis.  She is accompanied by a friend Mudlogger.  History is obtained from them, review of electronic medical records and I personally reviewed pertinent imaging films in PACS. Patient has past medical history of stage III chronic kidney disease, anemia, on hormone replacement therapy with JUNEL estrogen product for hot flashes who presented on 10/28/2020 with new onset persistent headache for 2 days.  She states she had been dealing with some sinus and left ear infection and hypersensitivity to sound for the last 4 to 5 weeks.  States she had a COVID booster shot on November 5 with Pfizer vaccine since then she has been treated with courses of cefdinir.  Followed by amoxicillin and prednisone for ear infection   without much relief.  She was also having a lot of sinus pressure and facial pain.  She underwent CT maxillofacial in the ER which showed a left sigmoid sinus and jugular vein nonocclusive thrombus.  MRI scan of the brain and subsequently obtained which was normal except abnormal signal in the superior sagittal, left transverse and sigmoid sinus.  MRI of the brain and neck were unremarkable.  2D echo showed normal ejection fraction without cardiac source of embolism.  LDL cholesterol was 143 mg and hemoglobin A1c 5.5 on 08/27/2020.  She will complete hypercoagulable panel labs  all of which  were normal.  MR venogram showed nonocclusive thrombus of the mid superior societal sinus extending posteriorly as well as thrombus within the left sigmoid and left internal  jugular vein.  Patient was initially started on IV heparin and then transition to Pradaxa 150 twice daily.  Patient states that she is still having daily persistent headaches with she describes this as a viselike grip in bifrontal and bitemporal regions and constant.  She has been taking Topamax 25 mg twice daily but has not noticed any benefit or any side effects.  She feels a left ear hearing loss has improved though she has no increased sensitivity to sound in the left ear.  Her vision is also improved.  She however feels that she is at times dragging her left leg with walking though she has had no falls or injuries.  She denies any loss of vision and difficulty reading.  She states she has been drinking lots of fluids.  She has stopped using the estrogen hormone replacement medication.  She denies any prior history of deep vein thrombosis, pulmonary embolism, recurrent miscarriages, strokes, TIAs, migraines or seizures.  There is no family history of abnormal clotting in the legs, lungs of the brain. Update 01/17/2021 : She returns for follow-up after last visit 2 months ago.  Patient states she is feeling much better.  Her headaches are almost gone.  They are much milder and dull and rare.  She is tolerating Topamax 50 mg twice daily seems to be working.  She denies any side effects on it.  She remains on Pradaxa 150 mg twice daily which is tolerating well without bruising or bleeding.  She did have follow-up brain scans on 11/17/2020 and MRI scan of the brain showed no abnormalities  and MR venogram showed partial recanalization with partial filling defects in the mid section of the superior sagittal sinus and the left transverse sinus more or less unchanged compared to the previous study from a month ago.  She has no new complaints today ROS:   14 system review of systems is positive for ear throbbing, headache and all other systems negative  PMH:  Past Medical History:  Diagnosis Date  . Anemia   .  Blood clots in brain   . Headache   . History of cold sores   . HPV (human papilloma virus) infection   . Post-operative nausea and vomiting     Social History:  Social History   Socioeconomic History  . Marital status: Divorced    Spouse name: Not on file  . Number of children: Not on file  . Years of education: Not on file  . Highest education level: Not on file  Occupational History  . Occupation: disability  Tobacco Use  . Smoking status: Never Smoker  . Smokeless tobacco: Never Used  Vaping Use  . Vaping Use: Never used  Substance and Sexual Activity  . Alcohol use: Never  . Drug use: Never  . Sexual activity: Not Currently  Other Topics Concern  . Not on file  Social History Narrative   Lives with son   Right Handed   Drinks no caffeine   Social Determinants of Health   Financial Resource Strain: Not on file  Food Insecurity: Not on file  Transportation Needs: Not on file  Physical Activity: Not on file  Stress: Not on file  Social Connections: Not on file  Intimate Partner Violence: Not on file    Medications:   Current Outpatient Medications on File Prior to Visit  Medication Sig Dispense Refill  . acetaminophen (TYLENOL) 500 MG tablet Take 1,000 mg by mouth every 6 (six) hours as needed for mild pain or headache.    Marland Kitchen atorvastatin (LIPITOR) 40 MG tablet Take 1 tablet (40 mg total) by mouth daily. 90 tablet 3  . dabigatran (PRADAXA) 150 MG CAPS capsule Take 1 capsule (150 mg total) by mouth every 12 (twelve) hours. 60 capsule 5  . Multiple Vitamin (MULTIVITAMIN) tablet Take 1 tablet by mouth daily.    Marland Kitchen topiramate (TOPAMAX) 50 MG tablet Take 1 tablet (50 mg total) by mouth 2 (two) times daily. 60 tablet 2  . valACYclovir (VALTREX) 1000 MG tablet Take 1 tablet (1,000 mg total) by mouth 2 (two) times daily. 14 tablet 0   No current facility-administered medications on file prior to visit.    Allergies:   Allergies  Allergen Reactions  . Doxycycline  Nausea And Vomiting    Physical Exam General: well developed, well nourished middle-aged Caucasian lady, seated, in no evident distress Head: head normocephalic and atraumatic.   Neck: supple with no carotid or supraclavicular bruits Cardiovascular: regular rate and rhythm, no murmurs Musculoskeletal: no deformity Skin:  no rash/petichiae Vascular:  Normal pulses all extremities  Neurologic Exam Mental Status: Awake and fully alert. Oriented to place and time. Recent and remote memory intact. Attention span, concentration and fund of knowledge appropriate. Mood and affect appropriate.  Cranial Nerves: Fundoscopic exam reveals sharp disc margins. Pupils equal, briskly reactive to light. Extraocular movements full without nystagmus. Visual fields full to confrontation. Hearing intact. Facial sensation intact. Face, tongue, palate moves normally and symmetrically.  Motor: Normal bulk and tone. Normal strength in all tested extremity muscles.  Tone slightly increased on the  left compared to the right. Sensory.: intact to touch , pinprick , position and vibratory sensation.  Coordination: Rapid alternating movements normal in all extremities. Finger-to-nose and heel-to-shin performed accurately bilaterally. Gait and Station: Arises from chair without difficulty. Stance is normal. Gait demonstrates normal stride length and balance with slight dragging of the left leg.. Able to heel, toe and tandem walk without difficulty.  Reflexes: 3+ brisk and asymmetric and more prominent on the left.. Toes downgoing.     ASSESSMENT: 55 year old Caucasian lady with superior sagittal sinus and left jugular vein thrombosis likely multifactorial due to combination of being on estrogen-containing medications, COVID-vaccine booster shot.     PLAN: I had a long discussion the patient with regards to her cerebral venous sinus thrombosis and discussed results of follow-up imaging studies and answered questions.   Recommend she continue Pradaxa 150 mg twice daily for a total 6 to 9 months.  I also encouraged her to maintain adequate hydration and drink 8 to 10 glasses of liquids/fluids a day.  Reduce Topamax to 25 mg twice daily since her headaches are mostly gone and if she can tolerate this for 2 weeks reduce further to once daily for 2 weeks and stop.  She will return for follow-up in the future in 3 months or call earlier if necessary.  May obtain follow-up MR venogram at that visit.  Greater than 50% time during this 25-minute visit was spent on counseling and coordination of care about her cerebral venous sinus thrombosis and discussion about evaluation and treatment plan and answering questions.  Antony Contras, MD Note: This document was prepared with digital dictation and possible smart phrase technology. Any transcriptional errors that result from this process are unintentional.

## 2021-02-03 ENCOUNTER — Other Ambulatory Visit: Payer: Self-pay | Admitting: Neurology

## 2021-02-06 ENCOUNTER — Other Ambulatory Visit: Payer: Self-pay

## 2021-02-06 ENCOUNTER — Ambulatory Visit
Admission: RE | Admit: 2021-02-06 | Discharge: 2021-02-06 | Disposition: A | Discharge status: Home / Self Care | Payer: 59 | Source: Ambulatory Visit | Attending: Family | Admitting: Family

## 2021-02-06 DIAGNOSIS — G08 Intracranial and intraspinal phlebitis and thrombophlebitis: Secondary | ICD-10-CM

## 2021-02-06 DIAGNOSIS — I82C12 Acute embolism and thrombosis of left internal jugular vein: Secondary | ICD-10-CM

## 2021-02-06 IMAGING — MR MR MRV HEAD WO/W CM
5 of 8 series · 32 of 48 positions shown · IV contrast (multihance)
Comparison: MR venogram [DATE] and [DATE].

CLINICAL DATA: History of intracranial venous thrombosis.

EXAM:
MR VENOGRAM HEAD WITHOUT AND WITH CONTRAST
TECHNIQUE: Angiographic images of the intracranial venous structures were
obtained using MRV technique without and with intravenous contrast.
CONTRAST:  11mL MULTIHANCE GADOBENATE DIMEGLUMINE 529 MG/ML IV SOLN

[Series 2: TOF · coronal · 2.5mm · 0.98mm/px · 11 of 118 slices shown]
[im 1/118]
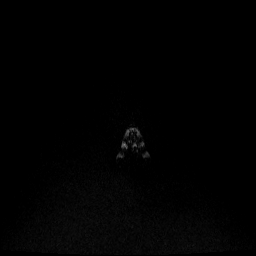
[im 12/118]
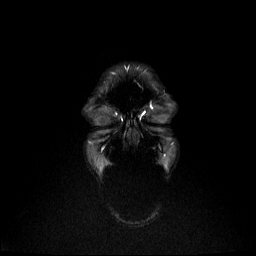
[im 24/118]
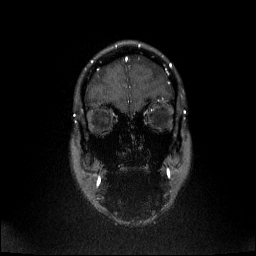
[im 36/118]
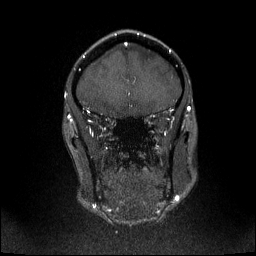
[im 47/118]
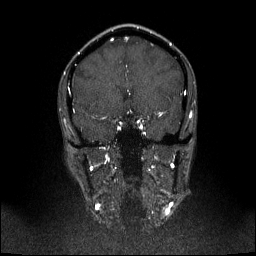
[im 59/118]
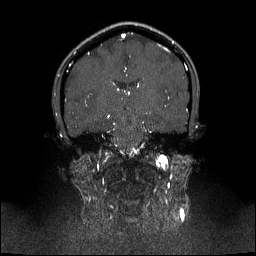
[im 71/118]
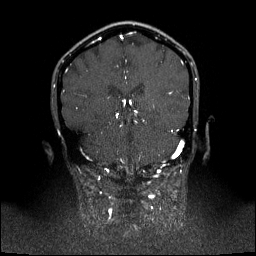
[im 82/118]
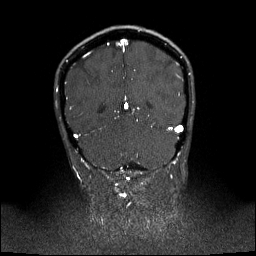
[im 94/118]
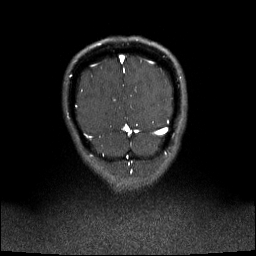
[im 106/118]
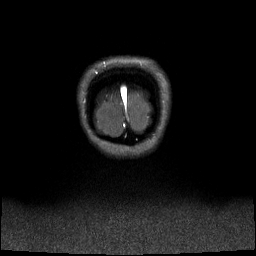
[im 118/118]
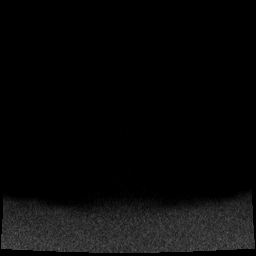

[Series 6: sag 3dpc flow · sagittal · 3.0mm · 0.43mm/px · 4 of 48 slices shown]
[im 1/48]
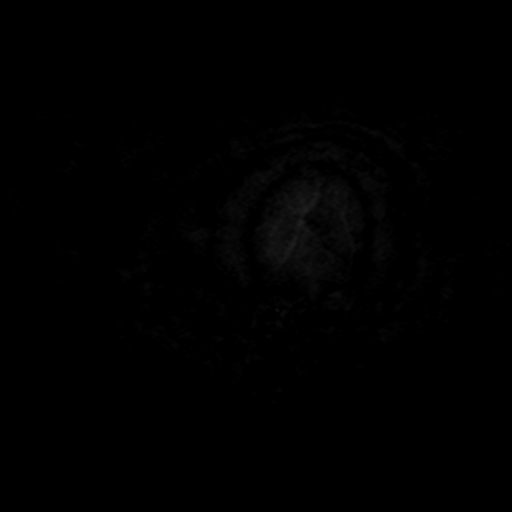
[im 16/48]
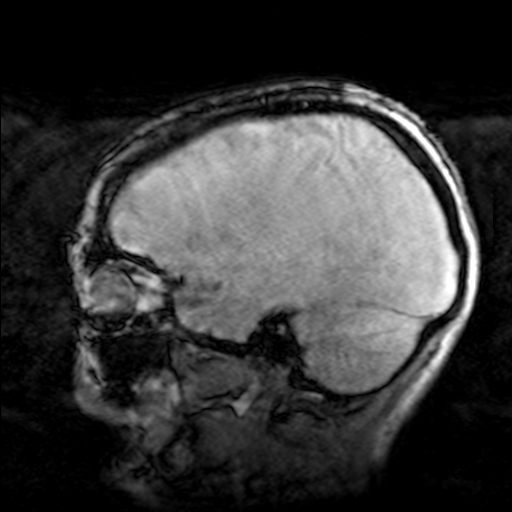
[im 32/48]
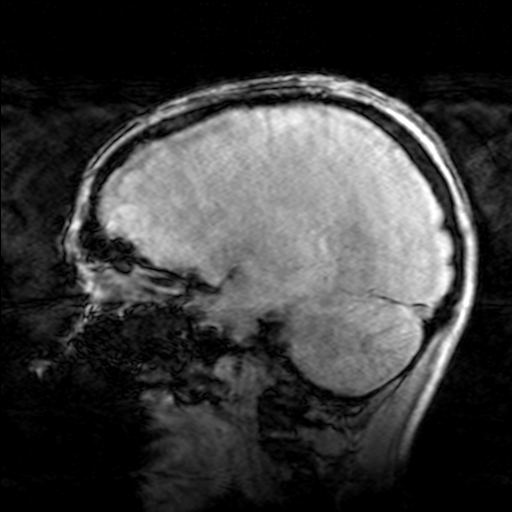
[im 48/48]
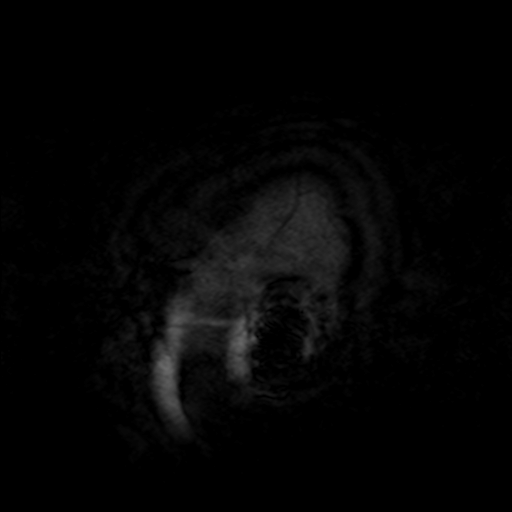

[Series 7: sag 3dpc flow_msum · sagittal · 3.0mm · 0.43mm/px · 5 of 48 slices shown]
[im 1/48]
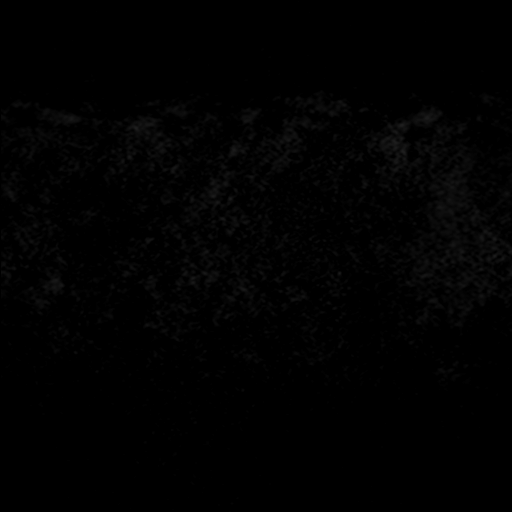
[im 12/48]
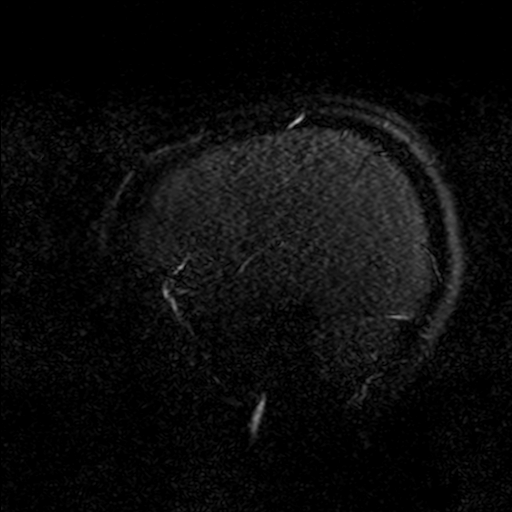
[im 24/48]
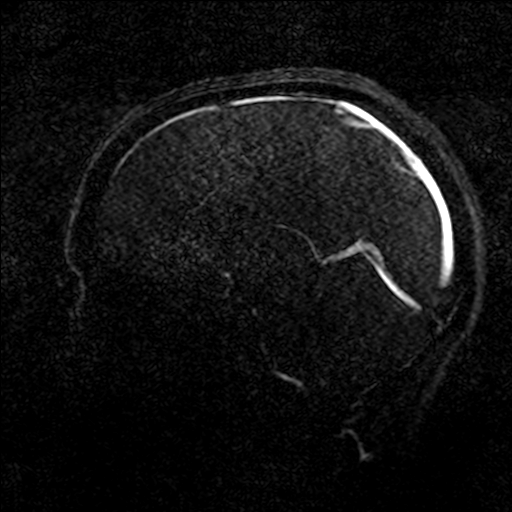
[im 36/48]
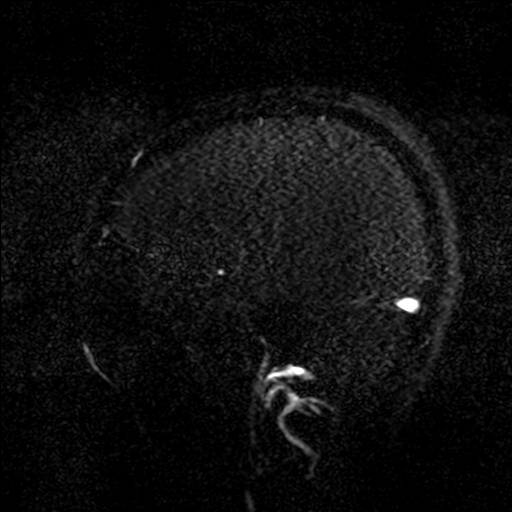
[im 48/48]
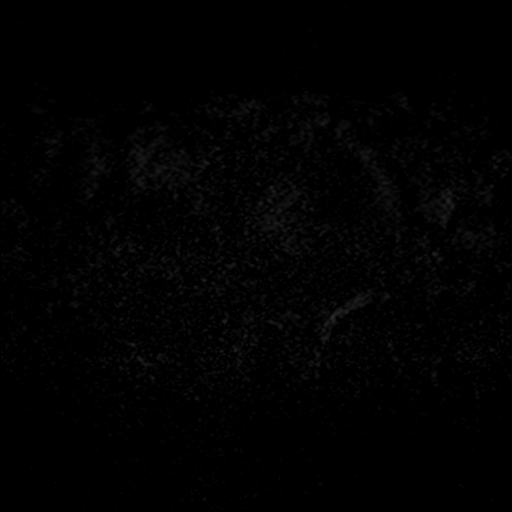

[Series 9: T1 · sagittal · 1.0mm · 0.94mm/px · 8 of 144 slices shown]
[im 1/144]
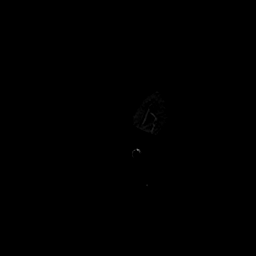
[im 23/144]
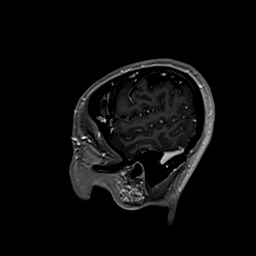
[im 45/144]
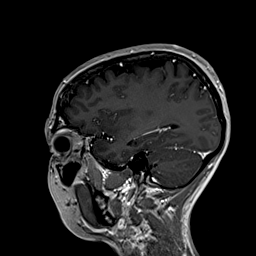
[im 67/144]
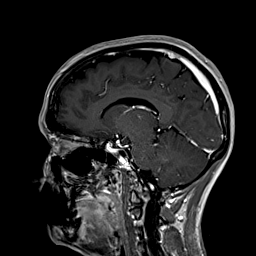
[im 78/144]
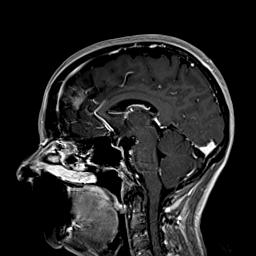
[im 100/144]
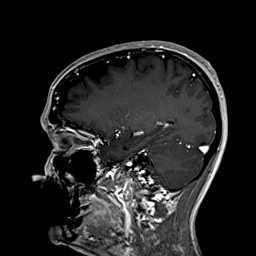
[im 122/144]
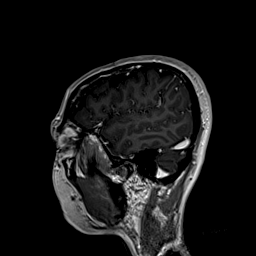
[im 144/144]
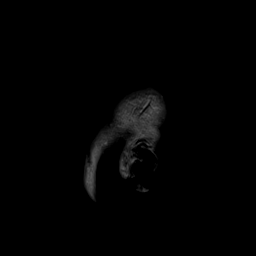

[Series 10: flow_pc3d_tra_p2_fast_mip · axial · 3.0mm · 0.43mm/px · z∈[-41,+58]mm · 4 of 56 slices shown]
[im 1/56]
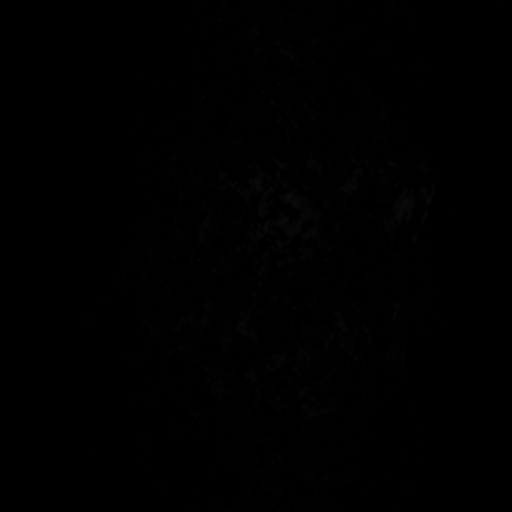
[im 12/56]
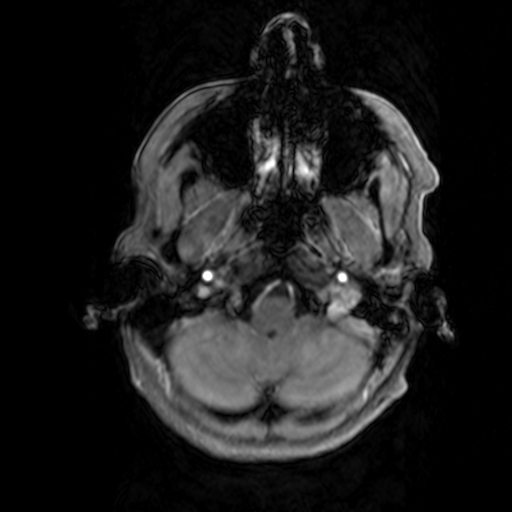
[im 23/56]
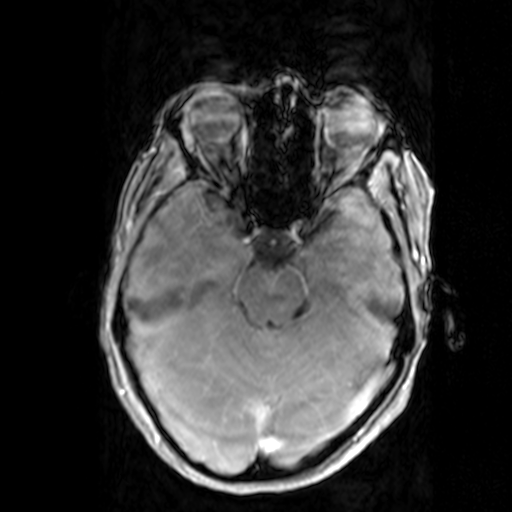
[im 34/56]
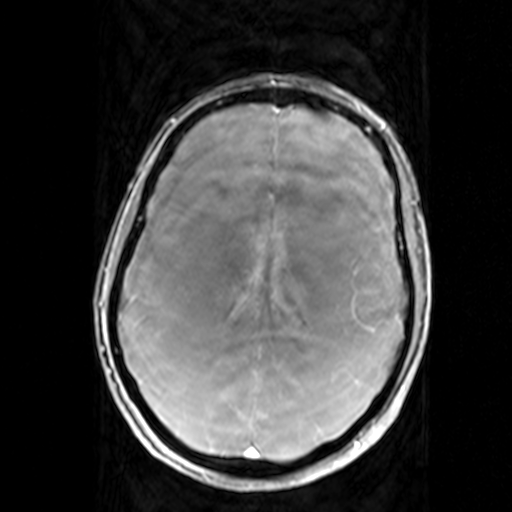

[32 of 48 positions shown; findings below may reference images not displayed]

FINDINGS: Previously noted segmental occlusion of the superior sagittal sinus
over the convexity has improved with a short segment occlusion
remaining.

Internal cerebral of veins and straight sinus widely patent.

Left transverse sinus is dominant. Left transverse sinus widely
patent. Small amount of residual filling defect in the left sigmoid
sinus extending into the left jugular vein, improved from prior
studies. Hypoplastic right transverse sinus again noted which
appears congenital.
IMPRESSION: Improvement in venous sinus thrombosis in the sagittal sinus and
left sigmoid sinus.

## 2021-02-06 MED ORDER — GADOBENATE DIMEGLUMINE 529 MG/ML IV SOLN
11.0000 mL | Freq: Once | INTRAVENOUS | Status: AC | PRN
  Administered 2021-02-06: 11 mL via INTRAVENOUS

## 2021-02-09 ENCOUNTER — Inpatient Hospital Stay (HOSPITAL_BASED_OUTPATIENT_CLINIC_OR_DEPARTMENT_OTHER): Payer: 59 | Admitting: Family

## 2021-02-09 ENCOUNTER — Inpatient Hospital Stay: Payer: 59 | Attending: Hematology & Oncology

## 2021-02-09 ENCOUNTER — Ambulatory Visit: Payer: 59 | Admitting: Family

## 2021-02-09 ENCOUNTER — Telehealth: Payer: Self-pay

## 2021-02-09 ENCOUNTER — Other Ambulatory Visit: Payer: 59

## 2021-02-09 ENCOUNTER — Other Ambulatory Visit: Payer: Self-pay

## 2021-02-09 ENCOUNTER — Encounter: Payer: Self-pay | Admitting: Family

## 2021-02-09 VITALS — BP 138/77 | HR 62 | Temp 98.6°F | Resp 17 | Ht 62.0 in | Wt 127.0 lb

## 2021-02-09 DIAGNOSIS — I82C12 Acute embolism and thrombosis of left internal jugular vein: Secondary | ICD-10-CM

## 2021-02-09 DIAGNOSIS — G08 Intracranial and intraspinal phlebitis and thrombophlebitis: Secondary | ICD-10-CM

## 2021-02-09 DIAGNOSIS — Z79899 Other long term (current) drug therapy: Secondary | ICD-10-CM | POA: Diagnosis not present

## 2021-02-09 DIAGNOSIS — D6859 Other primary thrombophilia: Secondary | ICD-10-CM

## 2021-02-09 DIAGNOSIS — Z7901 Long term (current) use of anticoagulants: Secondary | ICD-10-CM | POA: Insufficient documentation

## 2021-02-09 DIAGNOSIS — I82C22 Chronic embolism and thrombosis of left internal jugular vein: Secondary | ICD-10-CM | POA: Diagnosis not present

## 2021-02-09 LAB — CMP (CANCER CENTER ONLY)
ALT: 25 U/L (ref 0–44)
AST: 38 U/L (ref 15–41)
Albumin: 4.7 g/dL (ref 3.5–5.0)
Alkaline Phosphatase: 54 U/L (ref 38–126)
Anion gap: 9 (ref 5–15)
BUN: 20 mg/dL (ref 6–20)
CO2: 26 mmol/L (ref 22–32)
Calcium: 10.7 mg/dL — ABNORMAL HIGH (ref 8.9–10.3)
Chloride: 100 mmol/L (ref 98–111)
Creatinine: 1.15 mg/dL — ABNORMAL HIGH (ref 0.44–1.00)
GFR, Estimated: 57 mL/min — ABNORMAL LOW (ref >60)
Glucose, Bld: 88 mg/dL (ref 70–99)
Potassium: 3.9 mmol/L (ref 3.5–5.1)
Sodium: 135 mmol/L (ref 135–145)
Total Bilirubin: 0.5 mg/dL (ref 0.3–1.2)
Total Protein: 8.5 g/dL — ABNORMAL HIGH (ref 6.5–8.1)

## 2021-02-09 LAB — CBC WITH DIFFERENTIAL (CANCER CENTER ONLY)
Abs Immature Granulocytes: 0.01 10*3/uL (ref 0.00–0.07)
Basophils Absolute: 0 10*3/uL (ref 0.0–0.1)
Basophils Relative: 1 %
Eosinophils Absolute: 0.1 10*3/uL (ref 0.0–0.5)
Eosinophils Relative: 3 %
HCT: 36.7 % (ref 36.0–46.0)
Hemoglobin: 11.8 g/dL — ABNORMAL LOW (ref 12.0–15.0)
Immature Granulocytes: 0 %
Lymphocytes Relative: 35 %
Lymphs Abs: 1.2 10*3/uL (ref 0.7–4.0)
MCH: 28.9 pg (ref 26.0–34.0)
MCHC: 32.2 g/dL (ref 30.0–36.0)
MCV: 90 fL (ref 80.0–100.0)
Monocytes Absolute: 0.3 10*3/uL (ref 0.1–1.0)
Monocytes Relative: 8 %
Neutro Abs: 1.9 10*3/uL (ref 1.7–7.7)
Neutrophils Relative %: 53 %
Platelet Count: 204 10*3/uL (ref 150–400)
RBC: 4.08 MIL/uL (ref 3.87–5.11)
RDW: 14.6 % (ref 11.5–15.5)
WBC Count: 3.6 10*3/uL — ABNORMAL LOW (ref 4.0–10.5)
nRBC: 0 % (ref 0.0–0.2)

## 2021-02-09 NOTE — Telephone Encounter (Signed)
appts made per verabl/pt, pt aware that if order differs we will call   Chayse Zatarain

## 2021-02-09 NOTE — Progress Notes (Signed)
Hematology and Oncology Follow Up Visit  Sydney [name] [id number] [birth year] 55 y.o. 02/09/2021   Principle Diagnosis:  Cerebral venous sinus thrombosis  Protein S activity deficiency (mild)  Current Therapy:   Pradaxa 150 mg PO BID   Interim History:  Sydney [name] is here today for follow-up. Repeat MRI showed improvement in the venous sinus thrombosis in the sagittal sinus and left sigmoid sinus.  She notes that her headaches (front and back of head) and dizziness are much better. She still notes if she turns her head to quickly.  She had an episode of dizziness with dehydration recently and has improved her daily amount of hydration.  She notes that her eyes tire by the end of the day.  She is doing well on Pradaxa and has had no issue with bleeding. She does bruise easily on her arms but not in excess. No petechiae noted.  She was able to run a marathon this past weekend! No fever, chills, n/v, cough, rash, SOB, chest pain, palpitations, abdominal pain or changes in bowel or bladder habits.  No swelling, tenderness, numbness or tingling in her extremities at this time.  No falls or syncope to report.  She is eating well and weight is stable at 127 lbs.   ECOG Performance Status: 1 - Symptomatic but completely ambulatory  Medications:  Allergies as of 02/09/2021      Reactions   Doxycycline Nausea And Vomiting      Medication List       Accurate as of February 09, 2021  3:43 PM. If you have any questions, ask your nurse or doctor.        acetaminophen 500 MG tablet Commonly known as: TYLENOL Take 1,000 mg by mouth every 6 (six) hours as needed for mild pain or headache.   atorvastatin 40 MG tablet Commonly known as: LIPITOR Take 1 tablet (40 mg total) by mouth daily.   Biotin 5000 5 MG Caps Generic drug: Biotin Take by mouth.   dabigatran 150 MG Caps capsule Commonly known as: PRADAXA Take 1 capsule (150 mg total) by mouth every 12 (twelve) hours.    MAGNESIUM GLYCINATE PO Take by mouth.   multivitamin tablet Take 1 tablet by mouth daily.   topiramate 50 MG tablet Commonly known as: Topamax Take 1 tablet (50 mg total) by mouth 2 (two) times daily.   valACYclovir 1000 MG tablet Commonly known as: Valtrex Take 1 tablet (1,000 mg total) by mouth 2 (two) times daily.       Allergies:  Allergies  Allergen Reactions  . Doxycycline Nausea And Vomiting    Past Medical History, Surgical history, Social history, and Family History were reviewed and updated.  Review of Systems: All other 10 point review of systems is negative.   Physical Exam:  height is 5\' 2"  (1.575 m) and weight is 127 lb (57.6 kg). Her oral temperature is 98.6 F (37 C). Her blood pressure is 138/77 and her pulse is 62. Her respiration is 17 and oxygen saturation is 100%.   Wt Readings from Last 3 Encounters:  02/09/21 127 lb (57.6 kg)  01/17/21 130 lb 3.2 oz (59.1 kg)  12/13/20 128 lb 1.9 oz (58.1 kg)    Ocular: Sclerae unicteric, pupils equal, round and reactive to light Ear-nose-throat: Oropharynx clear, dentition fair Lymphatic: No cervical or supraclavicular adenopathy Lungs no rales or rhonchi, good excursion bilaterally Heart regular rate and rhythm, no murmur appreciated Abd soft, nontender, positive bowel sounds MSK no focal spinal  tenderness, no joint edema Neuro: non-focal, well-oriented, appropriate affect Breasts: Deferred   Lab Results  Component Value Date   WBC 3.6 (L) 02/09/2021   HGB 11.8 (L) 02/09/2021   HCT 36.7 02/09/2021   MCV 90.0 02/09/2021   PLT 204 02/09/2021   Lab Results  Component Value Date   FERRITIN 13 10/31/2020   IRON 23 (L) 10/31/2020   TIBC 375 10/31/2020   UIBC 352 10/31/2020   IRONPCTSAT 6 (L) 10/31/2020   Lab Results  Component Value Date   RETICCTPCT 1.7 10/31/2020   RBC 4.08 02/09/2021   No results found for: KPAFRELGTCHN, LAMBDASER, KAPLAMBRATIO No results found for: Kandis Cocking,  IGMSERUM Lab Results  Component Value Date   ALBUMINELP 3.8 09/28/2020   A1GS 0.5 (H) 09/28/2020   A2GS 0.9 09/28/2020   BETS 0.6 09/28/2020   BETA2SER 0.4 09/28/2020   GAMS 1.7 09/28/2020   SPEI  09/28/2020     Comment:     . Alpha-1 globulin increase noted. .      Chemistry      Component Value Date/Time   NA 137 12/13/2020 1025   K 3.4 (L) 12/13/2020 1025   CL 103 12/13/2020 1025   CO2 26 12/13/2020 1025   BUN 16 12/13/2020 1025   CREATININE 1.19 (H) 12/13/2020 1025   CREATININE 1.29 (H) 11/12/2020 1557      Component Value Date/Time   CALCIUM 10.8 (H) 12/13/2020 1025   ALKPHOS 56 12/13/2020 1025   AST 32 12/13/2020 1025   ALT 19 12/13/2020 1025   BILITOT 0.4 12/13/2020 1025       Impression and Plan: Sydney [name] is a very pleasant 55 yo caucasian female with diagnosis of nonocclusive thrombus within the mid superior sagittal sinus extending posteriorly, left sigmoid sinus and visualized left internal jugular vein. Repeat MRI this week showed overall improvement in venous sinus thrombosis in the sagittal sinus and left sigmoid sinus. She will continue her same regimen with Pradaxa.  Repeat MRI again 3 months to reassess thrombus.  Follow-up in 3 months.  She was encouraged to contact our office with any questions or concerns. We can certainly see her sooner if needed.   Laverna Peace, NP 4/13/20223:43 PM

## 2021-02-10 ENCOUNTER — Telehealth: Payer: Self-pay | Admitting: *Deleted

## 2021-02-10 ENCOUNTER — Ambulatory Visit: Payer: 59 | Admitting: Family

## 2021-02-10 ENCOUNTER — Other Ambulatory Visit: Payer: 59

## 2021-02-10 NOTE — Telephone Encounter (Signed)
No 02/09/21  [place] to schedule

## 2021-02-11 LAB — PROTEIN S, TOTAL: Protein S Ag, Total: 77 % (ref 60–150)

## 2021-02-11 LAB — PROTEIN S ACTIVITY: Protein S Activity: 224 % — ABNORMAL HIGH (ref 63–140)

## 2021-02-15 ENCOUNTER — Telehealth: Payer: Self-pay

## 2021-02-15 NOTE — Telephone Encounter (Signed)
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

## 2021-03-23 ENCOUNTER — Encounter: Payer: Self-pay | Admitting: Family Medicine

## 2021-03-23 ENCOUNTER — Other Ambulatory Visit: Payer: Self-pay

## 2021-03-23 ENCOUNTER — Ambulatory Visit (INDEPENDENT_AMBULATORY_CARE_PROVIDER_SITE_OTHER): Payer: 59 | Admitting: Family Medicine

## 2021-03-23 VITALS — BP 116/75 | HR 64 | Temp 98.3°F | Ht 62.0 in | Wt 125.0 lb

## 2021-03-23 DIAGNOSIS — N1831 Chronic kidney disease, stage 3a: Secondary | ICD-10-CM

## 2021-03-23 DIAGNOSIS — R232 Flushing: Secondary | ICD-10-CM | POA: Diagnosis not present

## 2021-03-23 DIAGNOSIS — R779 Abnormality of plasma protein, unspecified: Secondary | ICD-10-CM | POA: Diagnosis not present

## 2021-03-23 NOTE — Progress Notes (Signed)
This visit occurred during the SARS-CoV-2 public health emergency.  Safety protocols were in place, including screening questions prior to the visit, additional usage of staff PPE, and extensive cleaning of exam room while observing appropriate contact time as indicated for disinfecting solutions.    [place] , 16-Sep-1966, 55 y.o., female MRN: [id number] Patient Care Team    Relationship Specialty Notifications Start End  [name], DO PCP - General Family Medicine  12/03/18   [name], MD Consulting Physician Obstetrics and Gynecology  12/03/18     Chief Complaint  Patient presents with  . Hot Flashes     Subjective: Sydney [name] is a 55 y.o. female present for hot flashes.  Patient had been on placed on hormone replacement therapy by another provider for her hot flashes.  Unfortunately, she went on to have a cerebral venous sinus thrombosis.  With the discontinuation of hormone therapy secondary to thrombosis her hot flashes have returned with a vengeance.  She reports she will have flashes through the day and multiple times throughout the night waking her drenched in sweat.  She is wondering if there is something else she could take or do to help with her hot flashes.  Depression screen [facility] 2/9 08/27/2020 08/20/2019 12/03/2018  Decreased Interest 0 0 0  Down, Depressed, Hopeless 0 0 0  PHQ - 2 Score 0 0 0    Allergies  Allergen Reactions  . Doxycycline Nausea And Vomiting   Social History   Social History Narrative   Lives with son   Right Handed   Drinks no caffeine   Past Medical History:  Diagnosis Date  . Anemia   . Blood clots in brain   . Headache   . History of cold sores   . HPV (human papilloma virus) infection   . Post-operative nausea and vomiting    Past Surgical History:  Procedure Laterality Date  . BLEPHAROPLASTY  2005  . CESAREAN SECTION  2002   Family History  Problem Relation Age of Onset  . Lung cancer  Maternal Grandmother   . Lung cancer Maternal Grandfather   . Colon cancer Neg Hx   . Colon polyps Neg Hx   . Esophageal cancer Neg Hx   . Rectal cancer Neg Hx   . Stomach cancer Neg Hx    Allergies as of 03/23/2021      Reactions   Doxycycline Nausea And Vomiting      Medication List       Accurate as of Mar 23, 2021 11:59 PM. If you have any questions, ask your nurse or doctor.        STOP taking these medications   acetaminophen 500 MG tablet Commonly known as: TYLENOL Stopped by: Howard Pouch, DO   MAGNESIUM GLYCINATE PO Stopped by: Howard Pouch, DO   topiramate 50 MG tablet Commonly known as: Topamax Stopped by: Howard Pouch, DO     TAKE these medications   atorvastatin 40 MG tablet Commonly known as: LIPITOR Take 1 tablet (40 mg total) by mouth daily.   Biotin 5 MG Caps Take by mouth.   dabigatran 150 MG Caps capsule Commonly known as: PRADAXA Take 1 capsule (150 mg total) by mouth every 12 (twelve) hours.   multivitamin tablet Take 1 tablet by mouth daily.   valACYclovir 500 MG tablet Commonly known as: VALTREX Take 500 mg by mouth 2 (two) times daily as needed. What changed: Another medication with the same  name was removed. Continue taking this medication, and follow the directions you see here. Changed by: Howard Pouch, DO       All past medical history, surgical history, allergies, family history, immunizations andmedications were updated in the EMR today and reviewed under the history and medication portions of their EMR.     ROS: Negative, with the exception of above mentioned in HPI   Objective:  BP 116/75   Pulse 64   Temp 98.3 F (36.8 C) (Oral)   Ht 5\' 2"  (1.575 m)   Wt 125 lb (56.7 kg)   SpO2 100%   BMI 22.86 kg/m  Body mass index is 22.86 kg/m. Gen: Afebrile. No acute distress.  HENT: AT. [place].  Eyes:Pupils Equal Round Reactive to light, Extraocular movements intact,  Conjunctiva without redness, discharge or icterus. CV:  RRR  Chest: CTAB  Neuro:  Normal gait. PERLA. EOMi. Alert. Oriented x3  No exam data present No results found. No results found for this or any previous visit (from the past 24 hour(s)).  Assessment/Plan: Sydney [name] is a 55 y.o. female present for OV for  Stage 3a chronic kidney disease (HCC)/elevated protein Discussed evaluation on her mildly elevated creatinine and elevated protein.  SPEP was essentially normal with results consistent with an inflammatory reaction with alpha-1 band only. She is now established with oncology/hematology.  Her protein has increased further to 8.5 by labs collected April 2022 reviewed in the system. Creatinine is stabilized and back to her baseline. Encouraged her to continue to follow-up with heme/onc for elevated protein and her blood clots.  Hot flashes: We discussed different options today to help treat hot flashes. Discussed SNRI, SSRI therapy and she is currently not interested in prescribed medications to help control her hot flashes We discussed black cohosh, red Clover and soy isoflavones.  Patient understands these types of supplements are not FDA regulated and can interact with medicines including the anticoagulant she is on.  Black cohosh or red Clover may be an option for her after/if she is able to stop her anticoagulation. For now, she would like to try soy isoflavones and she was given information via AVS.   Reviewed expectations re: course of current medical issues.  Discussed self-management of symptoms.  Outlined signs and symptoms indicating need for more acute intervention.  Patient verbalized understanding and all questions were answered.  Patient received an After-Visit Summary.    No orders of the defined types were placed in this encounter.  No orders of the defined types were placed in this encounter.  Referral Orders  No referral(s) requested today     Note is dictated utilizing voice recognition  software. Although note has been proof read prior to signing, occasional typographical errors still can be missed. If any questions arise, please do not hesitate to call for verification.   electronically signed by:  Howard Pouch, DO  [place]

## 2021-03-23 NOTE — Patient Instructions (Signed)
Soy Isoflavones oral dosage forms What is this medicine? SOY ISOFLAVONES (soi iso FLA vons) is an herbal product or dietary supplement. It is promoted to help support hormone balance and to relieve some symptoms of menopause. The FDA has not approved this herb for any medical use. This supplement may be used for other purposes; ask your health care provider or pharmacist if you have questions. This medicine may be used for other purposes; ask your health care provider or pharmacist if you have questions. COMMON BRAND NAME(S): IsoRel What should I tell my health care provider before I take this medicine? They need to know if you have any of these conditions:  cancer  kidney disease  liver disease  taking hormone therapy or told to not take hormone therapy  thyroid disease  an unusual or allergic reaction to soy, soybeans, peanuts or other legumes, other herbs or plants, other medicines, foods, dyes, or preservatives  pregnant or trying to get pregnant  breast-feeding How should I use this medicine? Take by mouth with a glass of water. Follow the directions on the package labeling or ask your health care professional. Take with food or meals. Do not take this supplement more often than directed. Talk to your pediatrician regarding the use of this supplement in children. Special care may be needed. Overdosage: If you think you have taken too much of this medicine contact a poison control center or emergency room at once. NOTE: This medicine is only for you. Do not share this medicine with others. What if I miss a dose? If you miss a dose, take it as soon as you can. If it is almost time for your next dose, take only that dose. Do not take double or extra doses. What may interact with this medicine? Check with your doctor or healthcare professional if you are taking any of the following medications:  hormone therapy like birth control, fertility treatments, or hormone replacement  medicines  medicines for cancer  other supplements like red clover This list may not describe all possible interactions. Give your health care provider a list of all the medicines, herbs, non-prescription drugs, or dietary supplements you use. Also tell them if you smoke, drink alcohol, or use illegal drugs. Some items may interact with your medicine. What should I watch for while using this medicine? Tell your doctor or healthcare professional if your symptoms do not start to get better or if they get worse. Follow a healthy diet. Taking a vitamin supplement does not replace the need for a balanced diet. Some foods that have soy are soybeans, tofu, tempeh, miso, soy milk, and soy cheese. If you are scheduled for any medical or dental procedure, tell your healthcare provider that you are taking this supplement. You may need to stop taking this supplement before the procedure. Herbal or dietary supplements are not regulated like medicines. Rigid quality control standards are not required for dietary supplements. The purity and strength of these products can vary. The safety and effect of this dietary supplement for a certain disease or illness is not well known. This product is not intended to diagnose, treat, cure or prevent any disease. The Food and Drug Administration suggests the following to help consumers protect themselves:  Always read product labels and follow directions.  Natural does not mean a product is safe for humans to take.  Look for products that include USP after the ingredient name. This means that the manufacturer followed the standards of the [place] Pharmacopoeia.  Supplements made or sold by a nationally known food or drug company are more likely to be made under tight controls. You can write to the company for more information about how the product was made. What side effects may I notice from receiving this medicine? Side effects that you should report to your doctor or  health care professional as soon as possible:  allergic reactions like skin rash, itching or hives, swelling of the face, lips, or tongue  breathing problems Side effects that usually do not require medical attention (report to your doctor or health care professional if they continue or are bothersome):  constipation or diarrhea  gas  nausea  stomach upset This list may not describe all possible side effects. Call your doctor for medical advice about side effects. You may report side effects to FDA at 1-800-FDA-1088. Where should I keep my medicine? Keep out of the reach of children. Store at room temperature or as directed on the package label. Protect from moisture. Throw away any unused supplement after the expiration date. NOTE: This sheet is a summary. It may not cover all possible information. If you have questions about this medicine, talk to your doctor, pharmacist, or health care provider.  2021 Elsevier/Gold Standard (2008-06-18 16:22:49)

## 2021-03-24 ENCOUNTER — Telehealth: Payer: Self-pay

## 2021-03-24 DIAGNOSIS — R779 Abnormality of plasma protein, unspecified: Secondary | ICD-10-CM | POA: Insufficient documentation

## 2021-03-24 DIAGNOSIS — R232 Flushing: Secondary | ICD-10-CM | POA: Insufficient documentation

## 2021-03-24 NOTE — Telephone Encounter (Signed)
Pt called to r/s her appt as she will be starting a new job and will have new ins in august   Sydney [name]

## 2021-04-04 ENCOUNTER — Other Ambulatory Visit: Payer: Self-pay | Admitting: Family Medicine

## 2021-05-08 ENCOUNTER — Other Ambulatory Visit: Payer: 59

## 2021-05-09 ENCOUNTER — Ambulatory Visit: Payer: 59 | Admitting: Neurology

## 2021-05-11 ENCOUNTER — Other Ambulatory Visit: Payer: 59

## 2021-05-11 ENCOUNTER — Ambulatory Visit: Payer: 59 | Admitting: Family

## 2021-05-16 ENCOUNTER — Other Ambulatory Visit: Payer: 59

## 2021-05-16 ENCOUNTER — Ambulatory Visit: Payer: 59 | Admitting: Family

## 2021-05-18 ENCOUNTER — Ambulatory Visit: Payer: 59 | Admitting: Family

## 2021-05-18 ENCOUNTER — Other Ambulatory Visit: Payer: 59

## 2021-06-01 ENCOUNTER — Ambulatory Visit
Admission: RE | Admit: 2021-06-01 | Discharge: 2021-06-01 | Disposition: A | Discharge status: Home / Self Care | Payer: No Typology Code available for payment source | Source: Ambulatory Visit | Attending: Family | Admitting: Family

## 2021-06-01 ENCOUNTER — Other Ambulatory Visit: Payer: Self-pay

## 2021-06-01 ENCOUNTER — Telehealth: Payer: Self-pay | Admitting: Family Medicine

## 2021-06-01 DIAGNOSIS — I82C22 Chronic embolism and thrombosis of left internal jugular vein: Secondary | ICD-10-CM

## 2021-06-01 DIAGNOSIS — D6859 Other primary thrombophilia: Secondary | ICD-10-CM

## 2021-06-01 DIAGNOSIS — G08 Intracranial and intraspinal phlebitis and thrombophlebitis: Secondary | ICD-10-CM

## 2021-06-01 DIAGNOSIS — I82C12 Acute embolism and thrombosis of left internal jugular vein: Secondary | ICD-10-CM

## 2021-06-01 IMAGING — MR MR MRV HEAD WO/W CM
5 of 8 series · 30 of 48 positions shown · IV contrast (multihance)
Comparison: [DATE].  The

CLINICAL DATA: Acute embolism and thrombosis of left internal
jugular vein (HCC) I82.C12 ([2N]-CM)

Cerebral venous sinus thrombosis G08 ([2N]-CM)
Protein S deficiency (HCC) [2N] ([2N]-CM)
Chronic internal jugular vein thrombosis, left (HCC)
([2N]-CM)
EXAM:
MR VENOGRAM HEAD WITHOUT AND WITH CONTRAST
TECHNIQUE: Angiographic images of the intracranial venous structures were
acquired using MRV technique without and with intravenous contrast.
CONTRAST:  10mL MULTIHANCE GADOBENATE DIMEGLUMINE 529 MG/ML IV SOLN

[Series 2: (id) coronal · coronal · 3.0mm · 0.49mm/px · 9 of 90 slices shown]
[im 1/90]
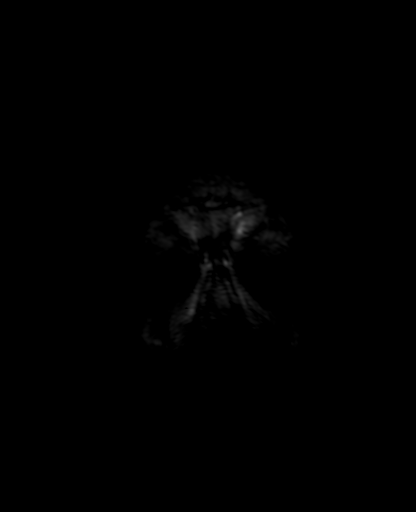
[im 12/90]
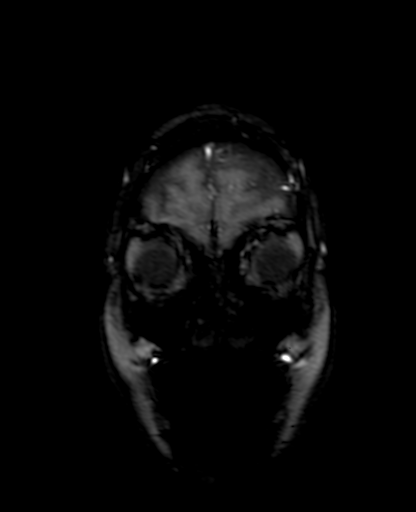
[im 23/90]
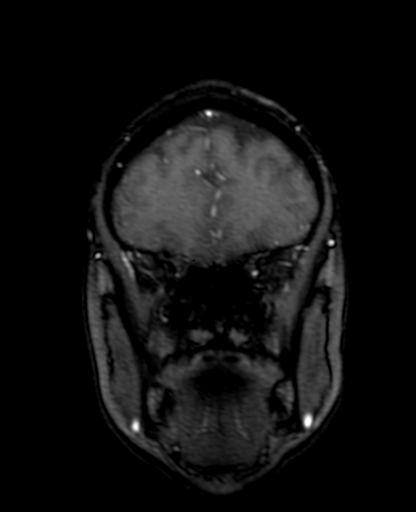
[im 34/90]
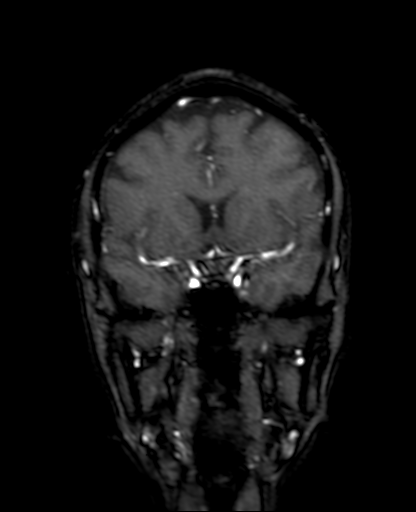
[im 45/90]
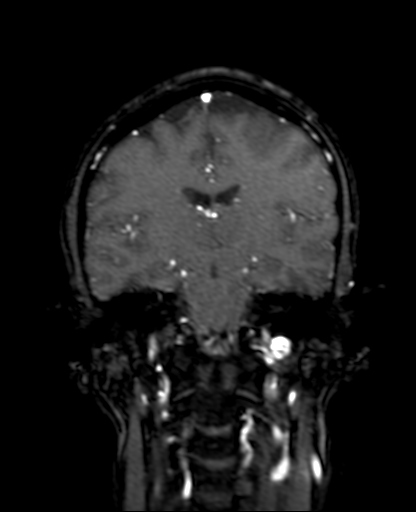
[im 56/90]
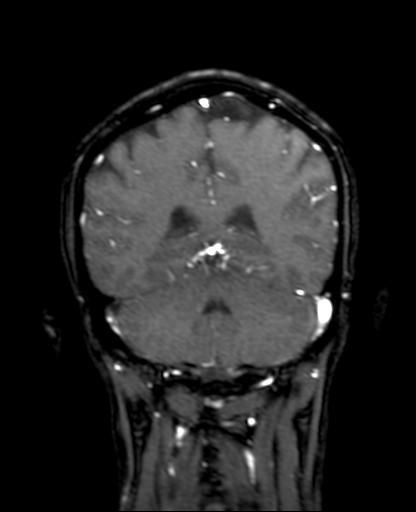
[im 67/90]
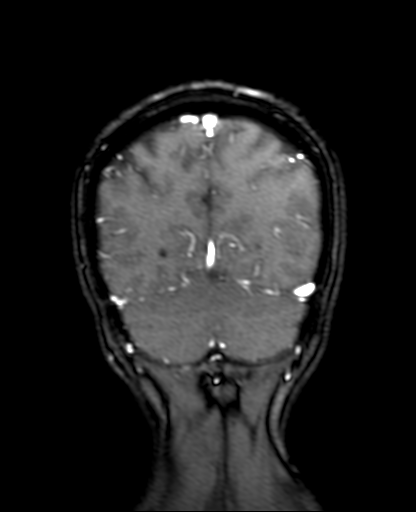
[im 78/90]
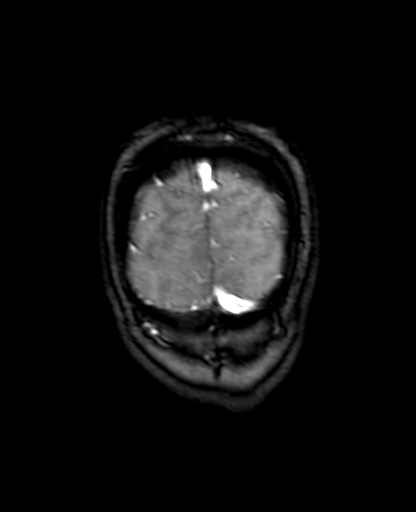
[im 90/90]
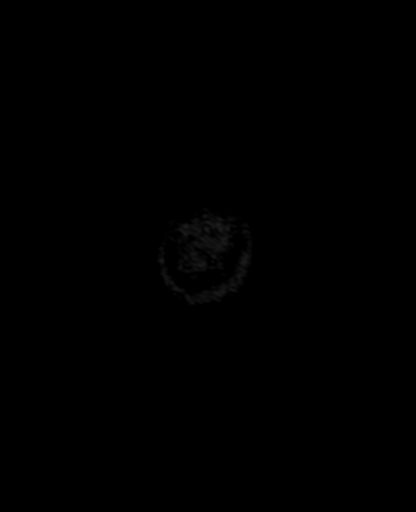

[Series 6: tof_3d cor · coronal · 1.5mm · 0.43mm/px · 8 of 117 slices shown]
[im 1/117]
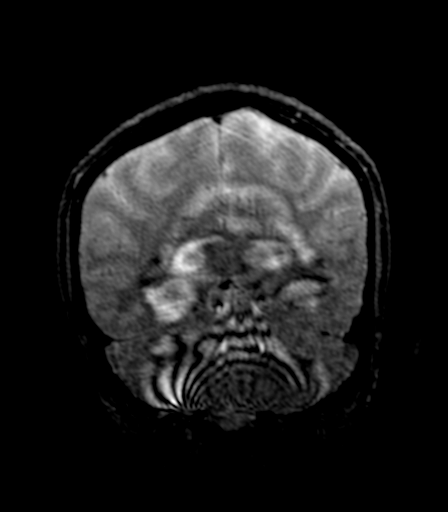
[im 18/117]
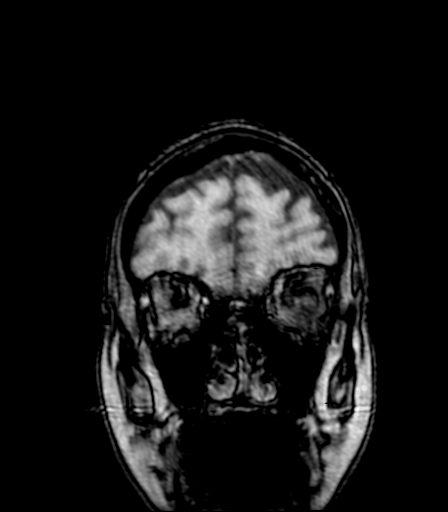
[im 36/117]
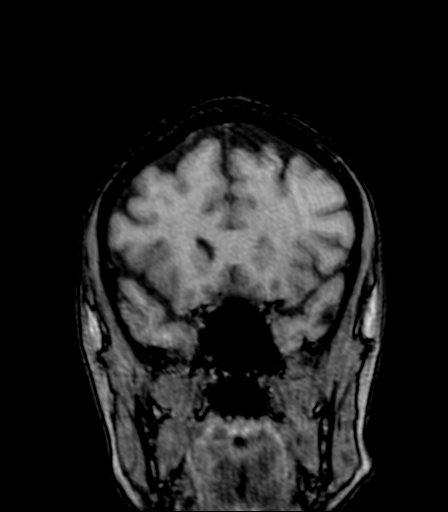
[im 54/117]
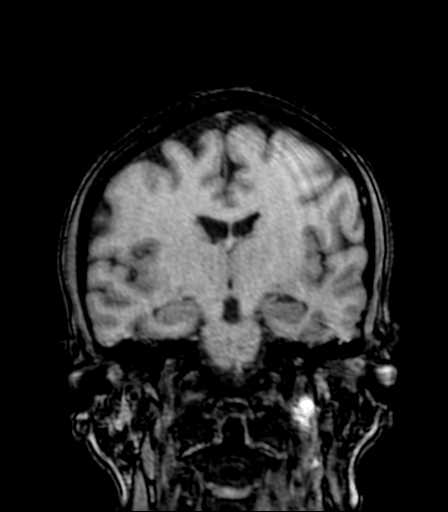
[im 63/117]
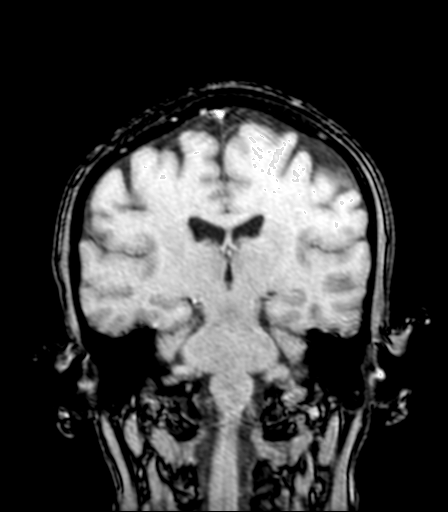
[im 81/117]
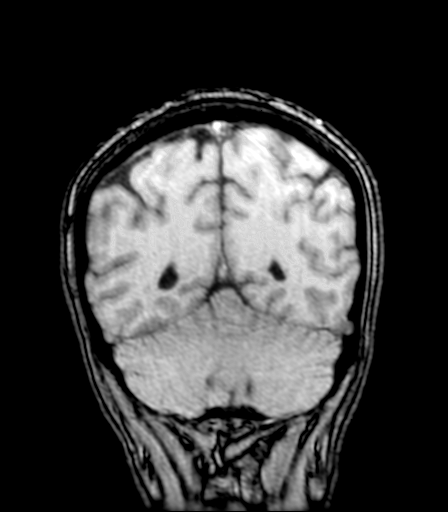
[im 99/117]
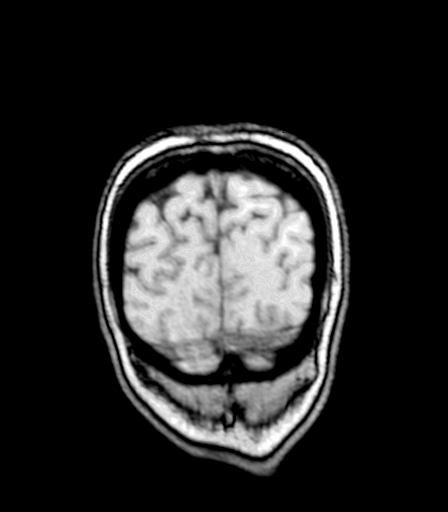
[im 117/117]
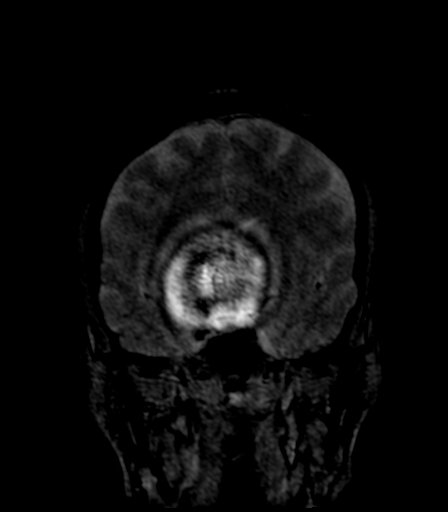

[Series 9: tof_3d cor_mip_tra · axial · 220.0mm · 0.43mm/px · 1 of 1 slices shown]
[im 1/1]
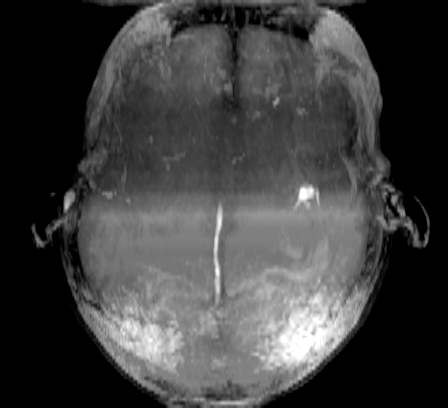

[Series 10: sag t1_mpr · sagittal · 1.0mm · 0.75mm/px · 11 of 144 slices shown]
[im 9/144]
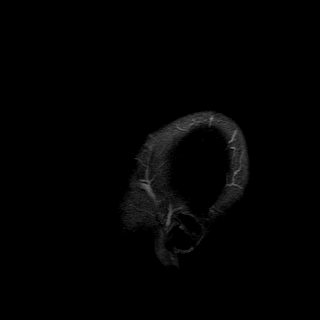
[im 18/144]
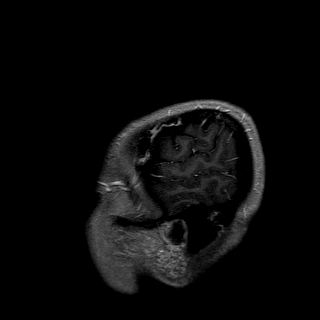
[im 27/144]
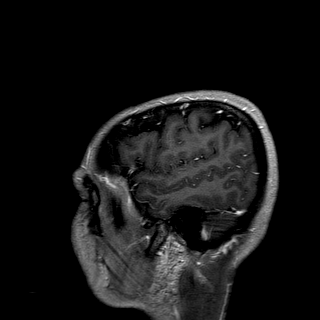
[im 45/144]
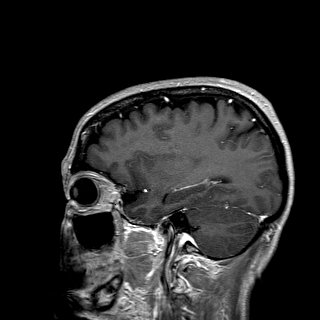
[im 63/144]
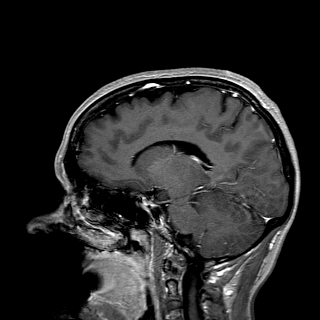
[im 72/144]
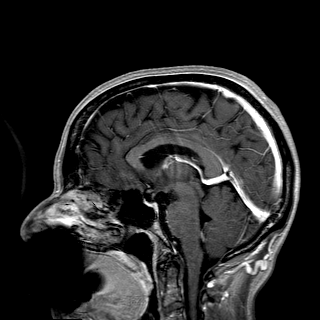
[im 81/144]
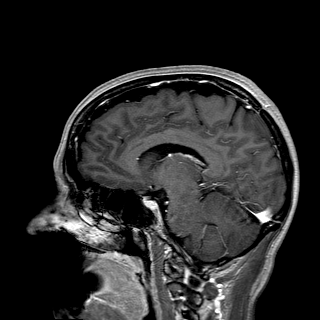
[im 99/144]
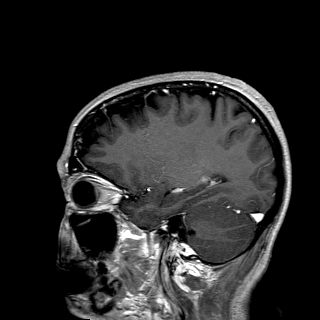
[im 117/144]
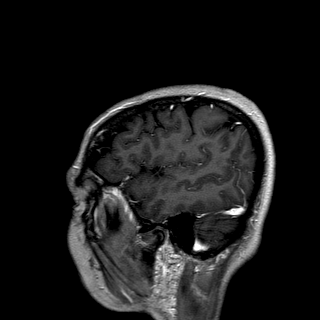
[im 126/144]
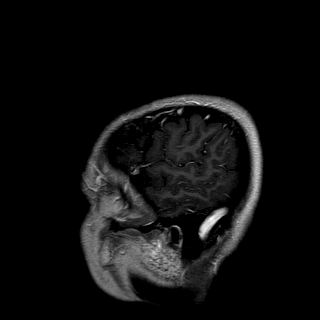
[im 135/144]
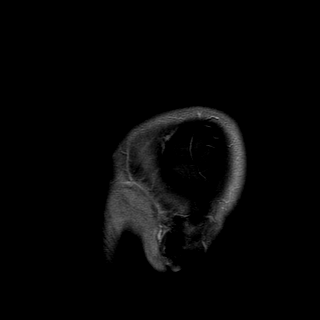

[Series 11: cor mpr · coronal · 1.0mm · 0.75mm/px · 1 of 19 slices shown]
[im 1/19]
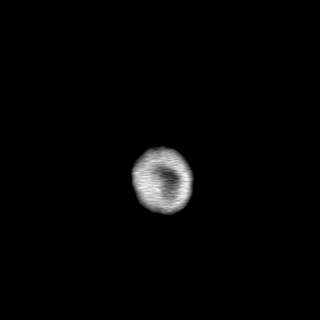

[30 of 48 positions shown; findings below may reference images not displayed]

FINDINGS: No convincing dural venous sinus thrombosis on postcontrast imaging.
There is similar loss of signal on the time-of-flight in the
posterior aspect of superior sagittal sinus, but this is favored to
be artifactual given opacification in this area on the postcontrast
imaging. Small right transverse and sigmoid sinuses.
IMPRESSION: No convincing evidence of dural venous sinus thrombosis.

## 2021-06-01 MED ORDER — VALACYCLOVIR HCL 1 G PO TABS: ORAL_TABLET | ORAL | 5 refills | Status: DC

## 2021-06-01 MED ORDER — GADOBENATE DIMEGLUMINE 529 MG/ML IV SOLN
10.0000 mL | Freq: Once | INTRAVENOUS | Status: AC | PRN
  Administered 2021-06-01: 10 mL via INTRAVENOUS

## 2021-06-01 NOTE — Telephone Encounter (Signed)
Last rx received from PCP 11/12/20 for flare up.  Please Advise.

## 2021-06-01 NOTE — Telephone Encounter (Signed)
Refilled valtrex for her cold sore flares.

## 2021-06-01 NOTE — Telephone Encounter (Signed)
Patient requesting Dr. Raoul Pitch take over prescribing acyclovir. She states her OBGYN that currently prescribes this is retiring. Patient does not feel she needs an appointment with Dr. Raoul Pitch because she has prescribed this in the patient. If appropriate, please send to [facility] in [place].

## 2021-06-01 NOTE — Telephone Encounter (Signed)
Pt aware.

## 2021-06-21 ENCOUNTER — Other Ambulatory Visit: Payer: Self-pay

## 2021-06-21 ENCOUNTER — Inpatient Hospital Stay: Payer: No Typology Code available for payment source | Attending: Family

## 2021-06-21 ENCOUNTER — Encounter: Payer: Self-pay | Admitting: Family

## 2021-06-21 ENCOUNTER — Inpatient Hospital Stay (HOSPITAL_BASED_OUTPATIENT_CLINIC_OR_DEPARTMENT_OTHER): Payer: No Typology Code available for payment source | Admitting: Family

## 2021-06-21 VITALS — BP 145/70 | HR 54 | Temp 98.4°F | Resp 17 | Wt 126.0 lb

## 2021-06-21 DIAGNOSIS — M436 Torticollis: Secondary | ICD-10-CM

## 2021-06-21 DIAGNOSIS — Z7901 Long term (current) use of anticoagulants: Secondary | ICD-10-CM | POA: Diagnosis not present

## 2021-06-21 DIAGNOSIS — G08 Intracranial and intraspinal phlebitis and thrombophlebitis: Secondary | ICD-10-CM | POA: Insufficient documentation

## 2021-06-21 DIAGNOSIS — R42 Dizziness and giddiness: Secondary | ICD-10-CM

## 2021-06-21 DIAGNOSIS — R519 Headache, unspecified: Secondary | ICD-10-CM

## 2021-06-21 DIAGNOSIS — I82C12 Acute embolism and thrombosis of left internal jugular vein: Secondary | ICD-10-CM

## 2021-06-21 DIAGNOSIS — I82C22 Chronic embolism and thrombosis of left internal jugular vein: Secondary | ICD-10-CM

## 2021-06-21 DIAGNOSIS — D6859 Other primary thrombophilia: Secondary | ICD-10-CM | POA: Diagnosis not present

## 2021-06-21 DIAGNOSIS — H9192 Unspecified hearing loss, left ear: Secondary | ICD-10-CM

## 2021-06-21 LAB — CMP (CANCER CENTER ONLY)
ALT: 18 U/L (ref 0–44)
AST: 32 U/L (ref 15–41)
Albumin: 4.5 g/dL (ref 3.5–5.0)
Alkaline Phosphatase: 74 U/L (ref 38–126)
Anion gap: 8 (ref 5–15)
BUN: 18 mg/dL (ref 6–20)
CO2: 27 mmol/L (ref 22–32)
Calcium: 10 mg/dL (ref 8.9–10.3)
Chloride: 100 mmol/L (ref 98–111)
Creatinine: 1.15 mg/dL — ABNORMAL HIGH (ref 0.44–1.00)
GFR, Estimated: 57 mL/min — ABNORMAL LOW (ref >60)
Glucose, Bld: 79 mg/dL (ref 70–99)
Potassium: 3.5 mmol/L (ref 3.5–5.1)
Sodium: 135 mmol/L (ref 135–145)
Total Bilirubin: 0.7 mg/dL (ref 0.3–1.2)
Total Protein: 8.8 g/dL — ABNORMAL HIGH (ref 6.5–8.1)

## 2021-06-21 LAB — CBC WITH DIFFERENTIAL (CANCER CENTER ONLY)
Abs Immature Granulocytes: 0.01 10*3/uL (ref 0.00–0.07)
Basophils Absolute: 0 10*3/uL (ref 0.0–0.1)
Basophils Relative: 1 %
Eosinophils Absolute: 0 10*3/uL (ref 0.0–0.5)
Eosinophils Relative: 1 %
HCT: 37 % (ref 36.0–46.0)
Hemoglobin: 12.4 g/dL (ref 12.0–15.0)
Immature Granulocytes: 0 %
Lymphocytes Relative: 40 %
Lymphs Abs: 1.3 10*3/uL (ref 0.7–4.0)
MCH: 31.1 pg (ref 26.0–34.0)
MCHC: 33.5 g/dL (ref 30.0–36.0)
MCV: 92.7 fL (ref 80.0–100.0)
Monocytes Absolute: 0.3 10*3/uL (ref 0.1–1.0)
Monocytes Relative: 9 %
Neutro Abs: 1.6 10*3/uL — ABNORMAL LOW (ref 1.7–7.7)
Neutrophils Relative %: 49 %
Platelet Count: 191 10*3/uL (ref 150–400)
RBC: 3.99 MIL/uL (ref 3.87–5.11)
RDW: 13.3 % (ref 11.5–15.5)
WBC Count: 3.3 10*3/uL — ABNORMAL LOW (ref 4.0–10.5)
nRBC: 0 % (ref 0.0–0.2)

## 2021-06-21 LAB — LACTATE DEHYDROGENASE: LDH: 142 U/L (ref 98–192)

## 2021-06-21 NOTE — Progress Notes (Signed)
Hematology and Oncology Follow Up Visit  Sydney [name] [id number] 1965-11-11 55 y.o. 06/22/2021   Principle Diagnosis:  Cerebral venous sinus thrombosis  Protein S activity deficiency (mild)   Current Therapy:        Pradaxa 150 mg PO BID   Interim History:  Sydney [name] is here today for follow-up. She is states that she is still having muffled hearing in the left ear, dizziness and neck stiffness. This is really effecting her quality of life. She is very active and enjoys running marathons and working out but has had to cut back due to the dizziness and pain.  I was able to speak with Dr. Ophelia Charter and got a little clarification on her recent MRI. It did show resolution of the dural venous sinus, left sagittal sinus, sigmoid sinus and left jugular vein thrombosis.  Since she is still having the symptoms mentioned above, we will refer her to ENT for further evaluation and possible treatment.  No fever, chills, n/v, cough, rash, SOB, chest pain, palpitations, abdominal pain or changes in bowel or bladder habits.  No swelling, tenderness, numbness or tingling in her extremities at this time.  No falls or syncope.  She has been eating well and doing her best to stay well hydrated. Weight is  stable at 126 lbs.   ECOG Performance Status: 1 - Symptomatic but completely ambulatory  Medications:  Allergies as of 06/21/2021       Reactions   Doxycycline Nausea And Vomiting        Medication List        Accurate as of June 21, 2021 11:59 PM. If you have any questions, ask your nurse or doctor.          atorvastatin 40 MG tablet Commonly known as: LIPITOR Take 1 tablet (40 mg total) by mouth daily.   Biotin 5 MG Caps Take by mouth.   multivitamin tablet Take 1 tablet by mouth daily.   Pradaxa 150 MG Caps capsule Generic drug: dabigatran TAKE 1 CAPSULE (150 MG TOTAL) BY MOUTH EVERY 12 (TWELVE) HOURS.   valACYclovir 1000 MG tablet Commonly known as: Valtrex 2  tabs at onset, rpt dose once in 12 hours.        Allergies:  Allergies  Allergen Reactions   Doxycycline Nausea And Vomiting    Past Medical History, Surgical history, Social history, and Family History were reviewed and updated.  Review of Systems: All other 10 point review of systems is negative.   Physical Exam:  weight is 126 lb (57.2 kg). Her oral temperature is 98.4 F (36.9 C). Her blood pressure is 145/70 (abnormal) and her pulse is 54 (abnormal). Her respiration is 17 and oxygen saturation is 99%.   Wt Readings from Last 3 Encounters:  06/21/21 126 lb (57.2 kg)  03/23/21 125 lb (56.7 kg)  02/09/21 127 lb (57.6 kg)    Ocular: Sclerae unicteric, pupils equal, round and reactive to light Ear-nose-throat: Oropharynx clear, dentition fair Lymphatic: No cervical or supraclavicular adenopathy Lungs no rales or rhonchi, good excursion bilaterally Heart regular rate and rhythm, no murmur appreciated Abd soft, nontender, positive bowel sounds MSK no focal spinal tenderness, no joint edema Neuro: non-focal, well-oriented, appropriate affect Breasts: Deferred   Lab Results  Component Value Date   WBC 3.3 (L) 06/21/2021   HGB 12.4 06/21/2021   HCT 37.0 06/21/2021   MCV 92.7 06/21/2021   PLT 191 06/21/2021   Lab Results  Component Value Date   FERRITIN  13 10/31/2020   IRON 23 (L) 10/31/2020   TIBC 375 10/31/2020   UIBC 352 10/31/2020   IRONPCTSAT 6 (L) 10/31/2020   Lab Results  Component Value Date   RETICCTPCT 1.7 10/31/2020   RBC 3.99 06/21/2021   No results found for: KPAFRELGTCHN, LAMBDASER, KAPLAMBRATIO No results found for: Kandis Cocking, IGMSERUM Lab Results  Component Value Date   ALBUMINELP 3.8 09/28/2020   A1GS 0.5 (H) 09/28/2020   A2GS 0.9 09/28/2020   BETS 0.6 09/28/2020   BETA2SER 0.4 09/28/2020   GAMS 1.7 09/28/2020   SPEI  09/28/2020     Comment:     . Alpha-1 globulin increase noted. .      Chemistry      Component Value  Date/Time   NA 135 06/21/2021 1452   K 3.5 06/21/2021 1452   CL 100 06/21/2021 1452   CO2 27 06/21/2021 1452   BUN 18 06/21/2021 1452   CREATININE 1.15 (H) 06/21/2021 1452   CREATININE 1.29 (H) 11/12/2020 1557      Component Value Date/Time   CALCIUM 10.0 06/21/2021 1452   ALKPHOS 74 06/21/2021 1452   AST 32 06/21/2021 1452   ALT 18 06/21/2021 1452   BILITOT 0.7 06/21/2021 1452       Impression and Plan: Sydney [name] is a very pleasant 55 yo caucasian female with diagnosis of nonocclusive thrombus within the mid superior sagittal sinus extending posteriorly, left sigmoid sinus and visualized left internal jugular vein. Repeat MRI showed resolution of thrombi.  She will continue her same regimen with Pradaxa.  Protein S studies pending.  Since she is still having symptoms as mentioned above we will refer her to ENT.  Follow-up in 4 months.  She was encouraged to contact our office with any questions or concerns.   Laverna Peace, NP 8/24/202212:42 PM

## 2021-06-22 ENCOUNTER — Telehealth: Payer: Self-pay | Admitting: *Deleted

## 2021-06-22 NOTE — Telephone Encounter (Signed)
No 06/21/21 LOS

## 2021-06-24 ENCOUNTER — Ambulatory Visit: Payer: 59 | Admitting: Family

## 2021-06-24 LAB — PROTEIN S ACTIVITY: Protein S Activity: >280 % — ABNORMAL HIGH (ref 63–140)

## 2021-06-24 LAB — PROTEIN S, TOTAL: Protein S Ag, Total: 73 % (ref 60–150)

## 2021-06-27 ENCOUNTER — Encounter: Payer: Self-pay | Admitting: Family

## 2021-06-27 ENCOUNTER — Other Ambulatory Visit: Payer: Self-pay | Admitting: Family

## 2021-07-13 ENCOUNTER — Encounter: Payer: Self-pay | Admitting: Family

## 2021-08-05 ENCOUNTER — Emergency Department (HOSPITAL_COMMUNITY)
Admission: EM | Admit: 2021-08-05 | Discharge: 2021-08-06 | Disposition: A | Discharge status: Home / Self Care | Payer: No Typology Code available for payment source | Attending: Emergency Medicine | Admitting: Emergency Medicine

## 2021-08-05 ENCOUNTER — Other Ambulatory Visit: Payer: Self-pay

## 2021-08-05 ENCOUNTER — Emergency Department (HOSPITAL_COMMUNITY): Payer: No Typology Code available for payment source

## 2021-08-05 DIAGNOSIS — N183 Chronic kidney disease, stage 3 unspecified: Secondary | ICD-10-CM | POA: Insufficient documentation

## 2021-08-05 DIAGNOSIS — R110 Nausea: Secondary | ICD-10-CM

## 2021-08-05 DIAGNOSIS — R2990 Unspecified symptoms and signs involving the nervous system: Secondary | ICD-10-CM

## 2021-08-05 DIAGNOSIS — Z79899 Other long term (current) drug therapy: Secondary | ICD-10-CM | POA: Insufficient documentation

## 2021-08-05 DIAGNOSIS — U071 COVID-19: Secondary | ICD-10-CM | POA: Diagnosis not present

## 2021-08-05 DIAGNOSIS — N9489 Other specified conditions associated with female genital organs and menstrual cycle: Secondary | ICD-10-CM | POA: Insufficient documentation

## 2021-08-05 DIAGNOSIS — I82C12 Acute embolism and thrombosis of left internal jugular vein: Secondary | ICD-10-CM | POA: Insufficient documentation

## 2021-08-05 DIAGNOSIS — R42 Dizziness and giddiness: Secondary | ICD-10-CM | POA: Diagnosis not present

## 2021-08-05 DIAGNOSIS — Z7901 Long term (current) use of anticoagulants: Secondary | ICD-10-CM | POA: Insufficient documentation

## 2021-08-05 DIAGNOSIS — R112 Nausea with vomiting, unspecified: Secondary | ICD-10-CM | POA: Diagnosis present

## 2021-08-05 LAB — DIFFERENTIAL
Abs Immature Granulocytes: 0.02 10*3/uL (ref 0.00–0.07)
Basophils Absolute: 0 10*3/uL (ref 0.0–0.1)
Basophils Relative: 0 %
Eosinophils Absolute: 0 10*3/uL (ref 0.0–0.5)
Eosinophils Relative: 0 %
Immature Granulocytes: 0 %
Lymphocytes Relative: 11 %
Lymphs Abs: 0.9 10*3/uL (ref 0.7–4.0)
Monocytes Absolute: 0.4 10*3/uL (ref 0.1–1.0)
Monocytes Relative: 4 %
Neutro Abs: 7.1 10*3/uL (ref 1.7–7.7)
Neutrophils Relative %: 85 %

## 2021-08-05 LAB — CBC
HCT: 36.6 % (ref 36.0–46.0)
Hemoglobin: 11.9 g/dL — ABNORMAL LOW (ref 12.0–15.0)
MCH: 30.1 pg (ref 26.0–34.0)
MCHC: 32.5 g/dL (ref 30.0–36.0)
MCV: 92.4 fL (ref 80.0–100.0)
Platelets: 164 10*3/uL (ref 150–400)
RBC: 3.96 MIL/uL (ref 3.87–5.11)
RDW: 13.3 % (ref 11.5–15.5)
WBC: 8.4 10*3/uL (ref 4.0–10.5)
nRBC: 0 % (ref 0.0–0.2)

## 2021-08-05 LAB — URINALYSIS, ROUTINE W REFLEX MICROSCOPIC
Bilirubin Urine: NEGATIVE
Glucose, UA: NEGATIVE mg/dL
Hgb urine dipstick: NEGATIVE
Ketones, ur: NEGATIVE mg/dL
Leukocytes,Ua: NEGATIVE
Nitrite: NEGATIVE
Protein, ur: NEGATIVE mg/dL
Specific Gravity, Urine: 1.017 (ref 1.005–1.030)
pH: 5 (ref 5.0–8.0)

## 2021-08-05 LAB — I-STAT CHEM 8, ED
BUN: 23 mg/dL — ABNORMAL HIGH (ref 6–20)
Calcium, Ion: 1.16 mmol/L (ref 1.15–1.40)
Chloride: 101 mmol/L (ref 98–111)
Creatinine, Ser: 1 mg/dL (ref 0.44–1.00)
Glucose, Bld: 93 mg/dL (ref 70–99)
HCT: 38 % (ref 36.0–46.0)
Hemoglobin: 12.9 g/dL (ref 12.0–15.0)
Potassium: 4.2 mmol/L (ref 3.5–5.1)
Sodium: 136 mmol/L (ref 135–145)
TCO2: 26 mmol/L (ref 22–32)

## 2021-08-05 LAB — COMPREHENSIVE METABOLIC PANEL
ALT: 13 U/L (ref 0–44)
AST: 24 U/L (ref 15–41)
Albumin: 3.8 g/dL (ref 3.5–5.0)
Alkaline Phosphatase: 55 U/L (ref 38–126)
Anion gap: 9 (ref 5–15)
BUN: 17 mg/dL (ref 6–20)
CO2: 24 mmol/L (ref 22–32)
Calcium: 9.3 mg/dL (ref 8.9–10.3)
Chloride: 100 mmol/L (ref 98–111)
Creatinine, Ser: 1.03 mg/dL — ABNORMAL HIGH (ref 0.44–1.00)
GFR, Estimated: >60 mL/min (ref >60)
Glucose, Bld: 97 mg/dL (ref 70–99)
Potassium: 3.7 mmol/L (ref 3.5–5.1)
Sodium: 133 mmol/L — ABNORMAL LOW (ref 135–145)
Total Bilirubin: 0.4 mg/dL (ref 0.3–1.2)
Total Protein: 7.6 g/dL (ref 6.5–8.1)

## 2021-08-05 LAB — RAPID URINE DRUG SCREEN, HOSP PERFORMED
Amphetamines: NOT DETECTED
Barbiturates: NOT DETECTED
Benzodiazepines: NOT DETECTED
Cocaine: NOT DETECTED
Opiates: NOT DETECTED
Tetrahydrocannabinol: NOT DETECTED

## 2021-08-05 LAB — CBG MONITORING, ED: Glucose-Capillary: 92 mg/dL (ref 70–99)

## 2021-08-05 LAB — PROTIME-INR
INR: 1.3 — ABNORMAL HIGH (ref 0.8–1.2)
Prothrombin Time: 15.7 seconds — ABNORMAL HIGH (ref 11.4–15.2)

## 2021-08-05 LAB — RESP PANEL BY RT-PCR (FLU A&B, COVID) ARPGX2
Influenza A by PCR: NEGATIVE
Influenza B by PCR: NEGATIVE
SARS Coronavirus 2 by RT PCR: POSITIVE — AB

## 2021-08-05 LAB — I-STAT BETA HCG BLOOD, ED (MC, WL, AP ONLY): I-stat hCG, quantitative: <5 m[IU]/mL (ref <5)

## 2021-08-05 LAB — APTT: aPTT: 42 seconds — ABNORMAL HIGH (ref 24–36)

## 2021-08-05 LAB — ETHANOL: Alcohol, Ethyl (B): <10 mg/dL (ref <10)

## 2021-08-05 IMAGING — CT CT ANGIO HEAD-NECK (W OR W/O PERF)
2 of 7 series · 8 of 33 positions shown · IV contrast (OMNI 350)
Comparison: Same day CT head, CT head [DATE], MRV [DATE]
and [DATE], MRA [DATE] twin.

CLINICAL DATA: Left arm weakness, dizziness, nausea

EXAM:
CT ANGIOGRAPHY HEAD AND NECK
TECHNIQUE: Multidetector CT imaging of the head and neck was performed using
the standard protocol during bolus administration of intravenous
contrast. Multiplanar CT image reconstructions and MIPs were
obtained to evaluate the vascular anatomy. Carotid stenosis
measurements (when applicable) are obtained utilizing NASCET
criteria, using the distal internal carotid diameter as the
denominator.
CONTRAST:  75mL OMNIPAQUE IOHEXOL 350 MG/ML SOLN

[Series 3: cta neck · axial · 0.47mm/px · z∈[-366,-242]mm · 2 of 177 slices shown]
[im 59/177  soft-tissue]
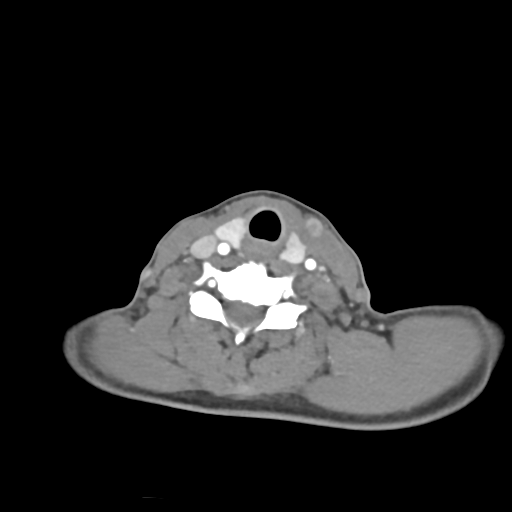
[im 118/177  soft-tissue]
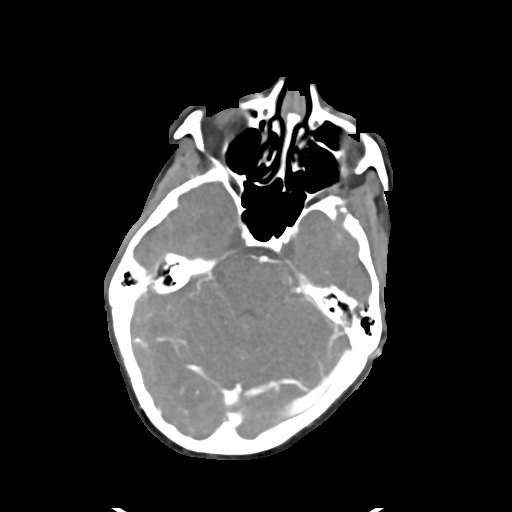

[Series 5: cta neck axial · axial · 0.39mm/px · z∈[-432,-178]mm · 6 of 353 slices shown]
[im 51/353  soft-tissue]
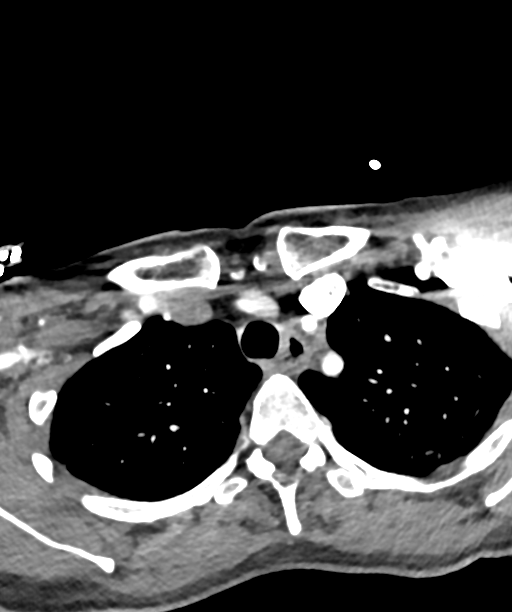
[im 101/353  bone]
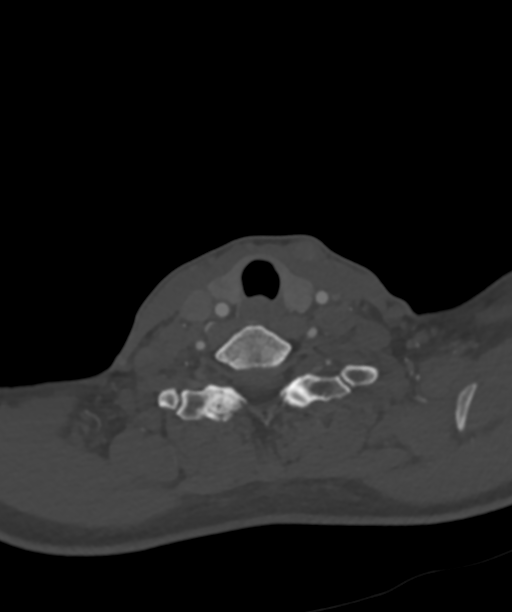
[im 151/353  soft-tissue]
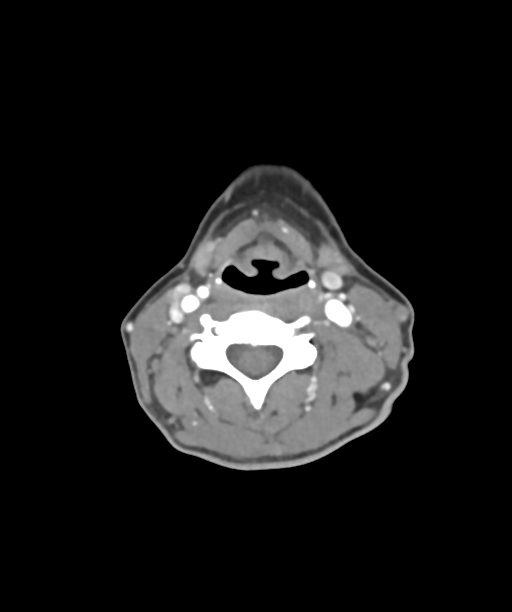
[im 202/353  bone]
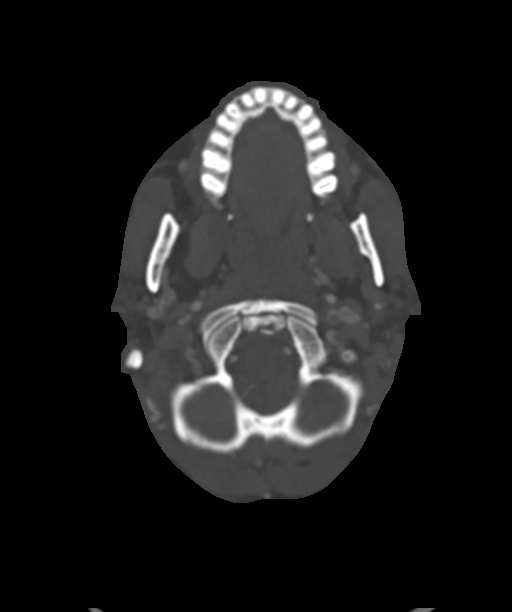
[im 252/353  soft-tissue]
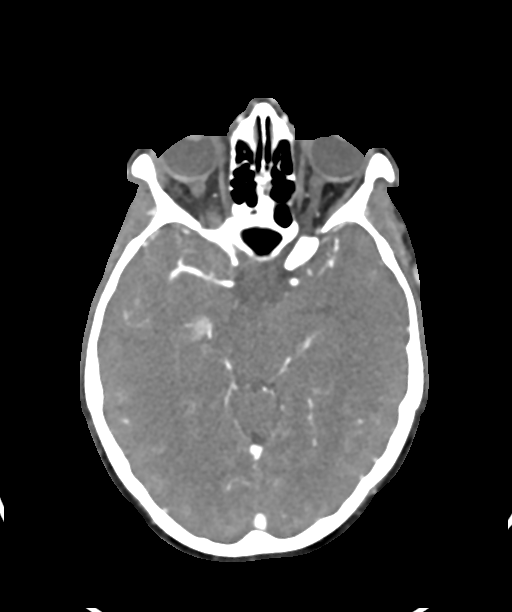
[im 302/353  bone]
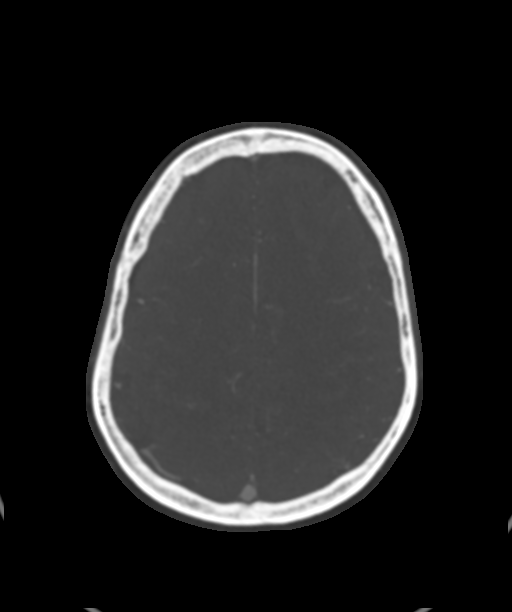

[8 of 33 positions shown; findings below may reference images not displayed]

FINDINGS: CT HEAD FINDINGS

Brain: No evidence of acute infarction, hemorrhage, cerebral edema,
mass, mass effect, or midline shift. Ventricles and sulci are within
normal limits for age. No extra-axial fluid collection.

Vascular: No hyperdense vessel or unexpected calcification.

Skull: Normal. Negative for fracture or focal lesion.

Sinuses/Orbits: No acute finding.

Other: The mastoid air cells are well aerated.

CTA NECK FINDINGS

Aortic arch: Standard branching. Imaged portion shows no evidence of
aneurysm or dissection. No significant stenosis of the major arch
vessel origins.

Right carotid system: No evidence of dissection, stenosis (50% or
greater) or occlusion.

Left carotid system: No evidence of dissection, stenosis (50% or
greater) or occlusion.

Vertebral arteries: Codominant. No evidence of dissection, stenosis
(50% or greater) or occlusion.

Skeleton: Degenerative changes in the cervical spine. No acute
osseous abnormality.

Other neck: No acute process.

Upper chest: No focal pulmonary opacity.

Review of the MIP images confirms the above findings

CTA HEAD FINDINGS

Anterior circulation: No significant stenosis, proximal occlusion,
aneurysm, or vascular malformation.

Posterior circulation: No significant stenosis, proximal occlusion,
aneurysm, or vascular malformation.

Venous sinuses: Patent.

Anatomic variants: Fetal origin of the right PCA.

Review of the MIP images confirms the above findings

CTV HEAD FINDINGS

No evidence of venous sinus thrombosis or stenosis.
IMPRESSION: 1. No intracranial large vessel occlusion or hemodynamically
significant stenosis in the neck.
2. No venous sinus stenosis or thrombosis.

## 2021-08-05 IMAGING — MR MR HEAD W/O CM
12 of 17 series · 34 of 48 positions shown · non-contrast
Comparison: Prior CTs from earlier the same day as well as previous
MRI from [DATE].

CLINICAL DATA: Follow-up examination for acute stroke.

EXAM:
MRI HEAD WITHOUT CONTRAST
TECHNIQUE: Multiplanar, multiecho pulse sequences of the brain and surrounding
structures were obtained without intravenous contrast.

[Series 5: DWI · axial · 3.0mm · 0.88mm/px · z∈[-86,+58]mm · 5 of 100 slices shown (1 of 4)]
[im 1/100]
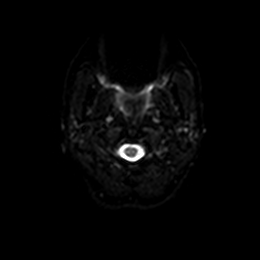
[im 25/100]
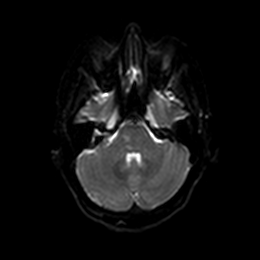
[im 50/100]
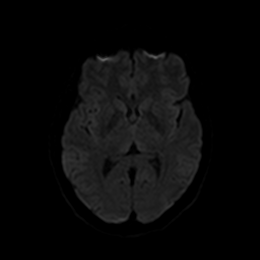
[im 75/100]
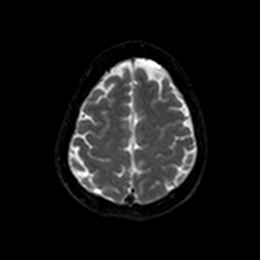
[im 100/100]
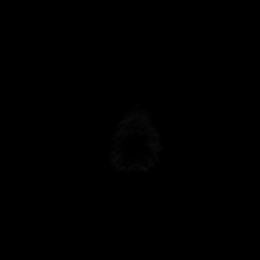

[Series 6: DWI · axial · 3.0mm · 0.88mm/px · z∈[-86,+58]mm · 2 of 49 slices shown (2 of 4)]
[im 1/49]
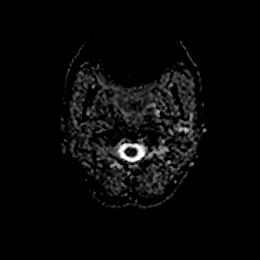
[im 49/49]
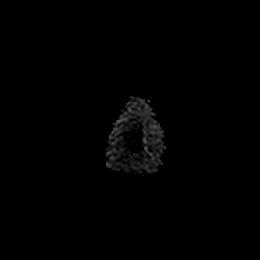

[Series 7: DWI · coronal · 4.0mm · 0.88mm/px · 3 of 68 slices shown (3 of 4)]
[im 1/68]
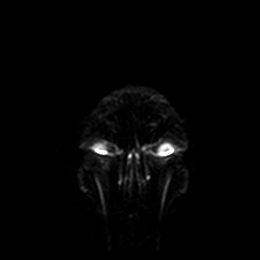
[im 34/68]
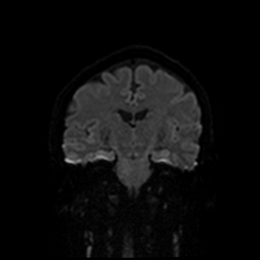
[im 68/68]
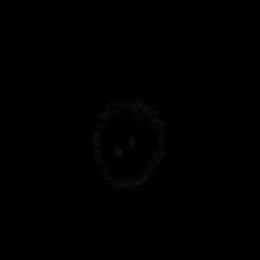

[Series 8: DWI · coronal · 4.0mm · 0.88mm/px · 2 of 34 slices shown (4 of 4)]
[im 1/34]
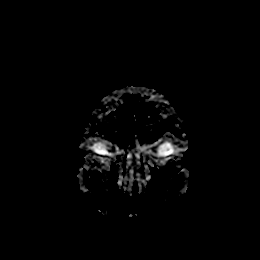
[im 34/34]
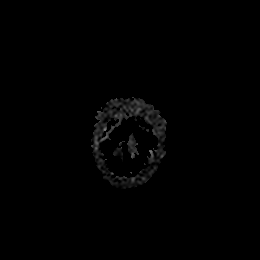

[Series 9: T1 · sagittal · 5.0mm · 0.75mm/px · 1 of 24 slices shown]
[im 1/24]
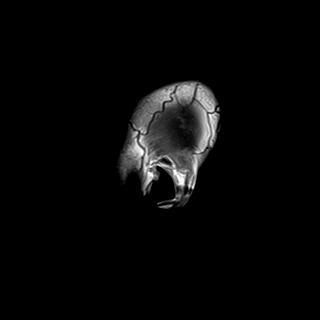

[Series 10: T2 · axial · 5.0mm · 0.72mm/px · z∈[-96,+57]mm · 2 of 27 slices shown (1 of 2)]
[im 1/27]
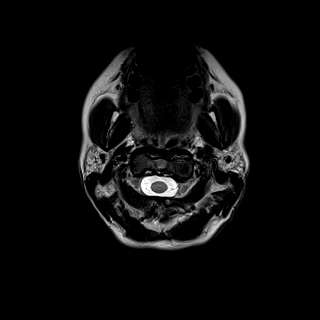
[im 27/27]
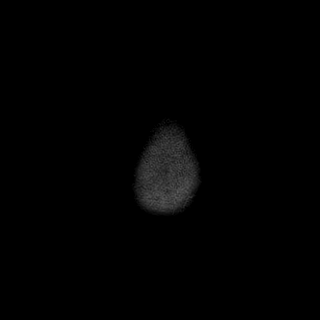

[Series 11: FLAIR · axial · 5.0mm · 0.45mm/px · z∈[-96,+57]mm · 2 of 27 slices shown]
[im 1/27]
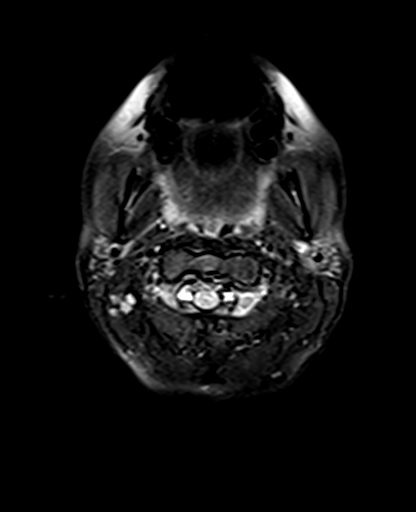
[im 27/27]
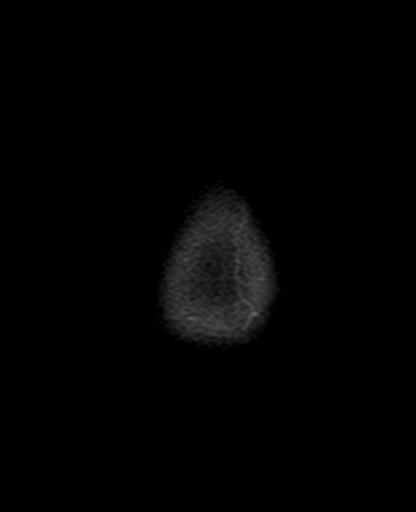

[Series 12: mag_images · axial · 3.0mm · 0.90mm/px · z∈[-114,+59]mm · 4 of 60 slices shown]
[im 1/60]
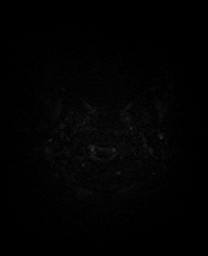
[im 20/60]
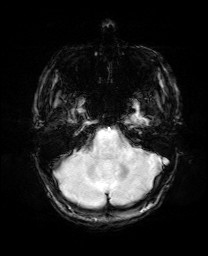
[im 40/60]
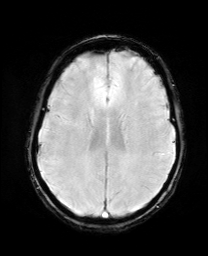
[im 60/60]
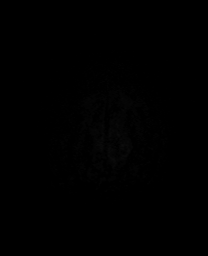

[Series 13: pha_images · axial · 3.0mm · 0.90mm/px · z∈[-114,+59]mm · 4 of 59 slices shown]
[im 1/59]
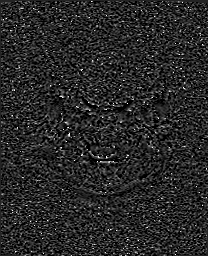
[im 20/59]
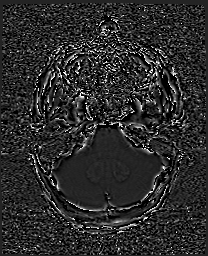
[im 39/59]
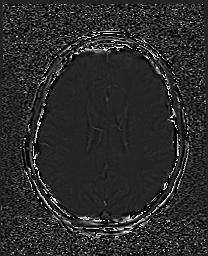
[im 59/59]
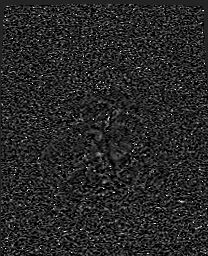

[Series 14: swi_images · axial · 3.0mm · 0.90mm/px · z∈[-114,+59]mm · 4 of 60 slices shown]
[im 1/60]
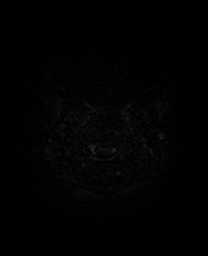
[im 20/60]
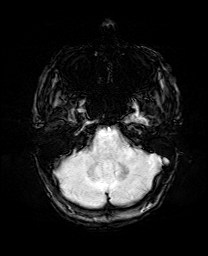
[im 40/60]
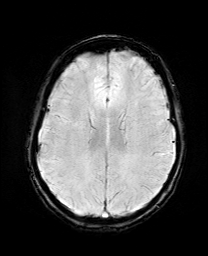
[im 60/60]
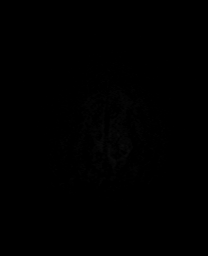

[Series 15: mip_images(sw) · axial · 24.0mm · 0.90mm/px · z∈[-103,+49]mm · 3 of 53 slices shown]
[im 1/53]
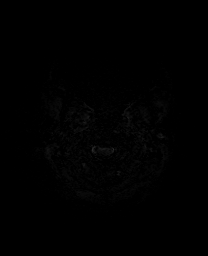
[im 27/53]
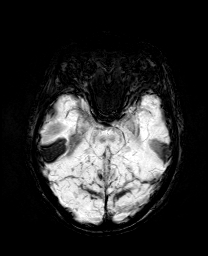
[im 53/53]
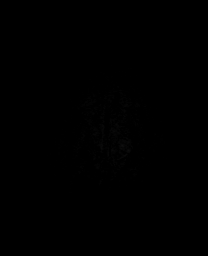

[Series 17: T2 · coronal · 5.0mm · 0.34mm/px · 2 of 29 slices shown (2 of 2)]
[im 1/29]
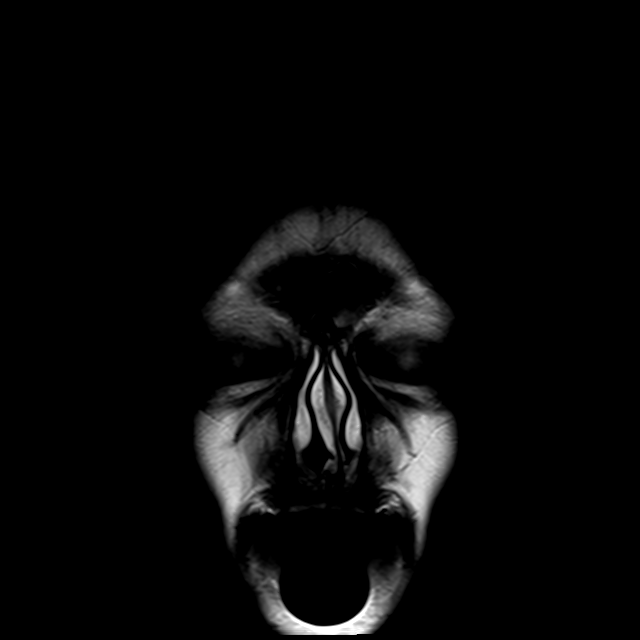
[im 29/29]
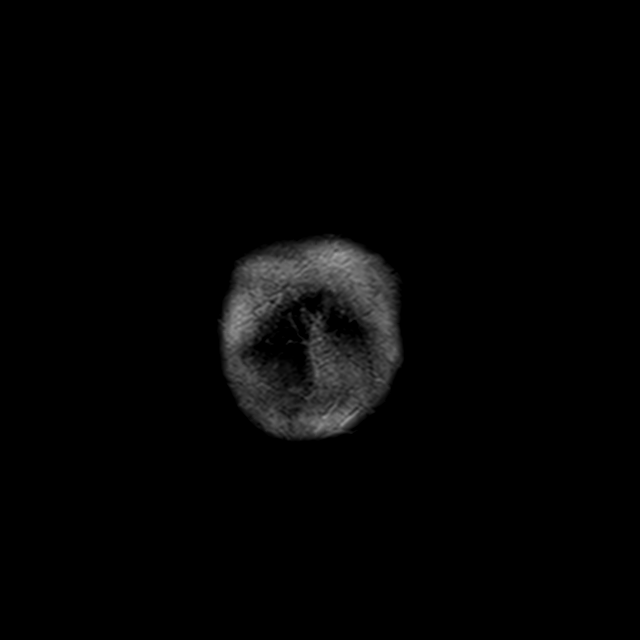

[34 of 48 positions shown; findings below may reference images not displayed]

FINDINGS: Brain: Cerebral volume within normal limits. Mild scattered patchy
T2 signal abnormality seen involving the supratentorial cerebral
white matter and pons, most likely mild chronic microvascular
ischemic disease.

No evidence for acute or subacute infarct. Gray-white matter
differentiation maintained. No encephalomalacia to suggest chronic
or interval cortical infarction. No evidence for acute or chronic
intracranial hemorrhage.

No mass lesion, mass effect or midline shift. No hydrocephalus.
There has been interval development of smooth dural thickening
overlying both cerebral hemispheres (series 11, image 17).
Additionally, there is suspected trace bilateral subdural hygromas,
measuring no more than 1-2 mm (series 17, image 14). Mild partial
effacement of the prepontine cistern as compared to prior brain MRI
with suggestion of mild sagging of the brain at the foramen magnum,
and narrowing of the midbrain-pontine angle. Constellation of
findings can be seen the setting of CSF hypotension. Pituitary gland
remains within normal limits. Midline structures intact.

Vascular: Major intracranial vascular flow voids are maintained.

Skull and upper cervical spine: Craniocervical junction otherwise
unremarkable. Somewhat diffusely decreased T1 signal intensity seen
within the visualized bone marrow, nonspecific, but could be related
to patient history of anemia. No focal marrow replacing lesion. No
scalp soft tissue abnormality.

Sinuses/Orbits: Globes and orbital soft tissues demonstrate no acute
finding. Mild scattered mucosal thickening noted within the
ethmoidal air cells. Paranasal sinuses are otherwise clear. Trace
left mastoid effusion noted, of doubtful significance. Inner ear
structures grossly normal.

Other: None.
IMPRESSION: 1. Interval development of smooth dural thickening overlying both
cerebral hemispheres with suspected trace bilateral subdural
hygromas. Findings are nonspecific, and can be seen in the setting
of CSF hypotension. Changes from previously identified dural sinus
thrombosis could also be considered, although no demonstrable
thrombosis seen on prior CTV from earlier today. Changes from prior
intervention or possibly meningitis could also be considered.
Correlation with LP with opening pressures and CSF studies suggested
as clinically warranted.
2. No other acute intracranial abnormality.

## 2021-08-05 IMAGING — CT CT VENOGRAM HEAD
1 of 7 series · 2 of 33 positions shown · IV contrast (Omni 300)
Comparison: Same day CT head, CT head [DATE], MRV [DATE]
and [DATE], MRA [DATE] twin.

CLINICAL DATA: Left arm weakness, dizziness, nausea

EXAM:
CT ANGIOGRAPHY HEAD AND NECK
TECHNIQUE: Multidetector CT imaging of the head and neck was performed using
the standard protocol during bolus administration of intravenous
contrast. Multiplanar CT image reconstructions and MIPs were
obtained to evaluate the vascular anatomy. Carotid stenosis
measurements (when applicable) are obtained utilizing NASCET
criteria, using the distal internal carotid diameter as the
denominator.
CONTRAST:  75mL OMNIPAQUE IOHEXOL 350 MG/ML SOLN

[Series 5: head with ax · axial · 0.32mm/px · z∈[-239,-187]mm · 2 of 80 slices shown]
[im 27/80  soft-tissue]
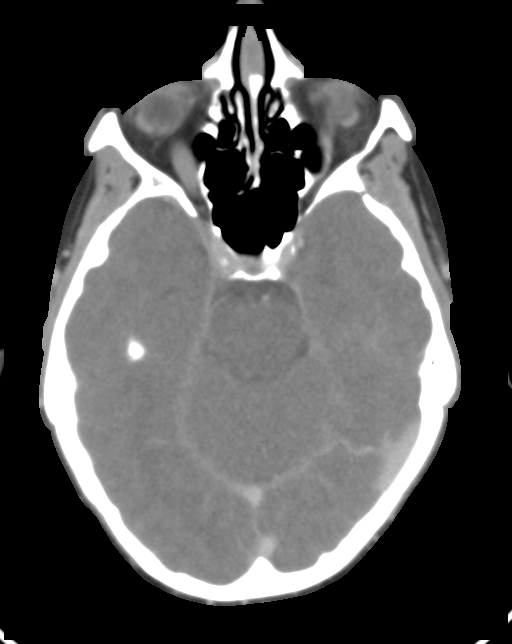
[im 53/80  bone]
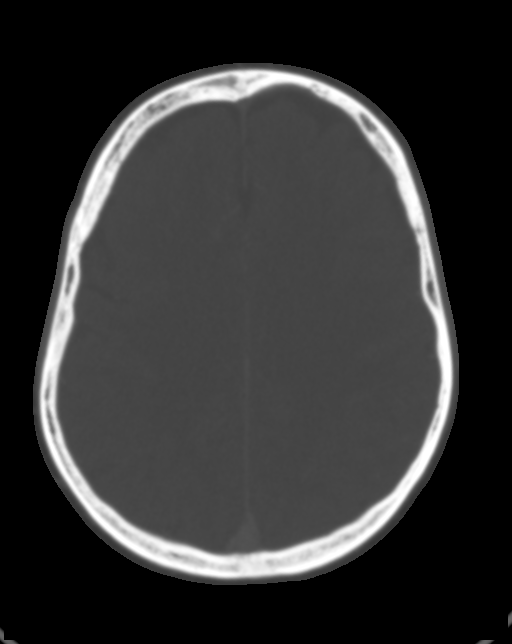

[2 of 33 positions shown; findings below may reference images not displayed]

FINDINGS: CT HEAD FINDINGS

Brain: No evidence of acute infarction, hemorrhage, cerebral edema,
mass, mass effect, or midline shift. Ventricles and sulci are within
normal limits for age. No extra-axial fluid collection.

Vascular: No hyperdense vessel or unexpected calcification.

Skull: Normal. Negative for fracture or focal lesion.

Sinuses/Orbits: No acute finding.

Other: The mastoid air cells are well aerated.

CTA NECK FINDINGS

Aortic arch: Standard branching. Imaged portion shows no evidence of
aneurysm or dissection. No significant stenosis of the major arch
vessel origins.

Right carotid system: No evidence of dissection, stenosis (50% or
greater) or occlusion.

Left carotid system: No evidence of dissection, stenosis (50% or
greater) or occlusion.

Vertebral arteries: Codominant. No evidence of dissection, stenosis
(50% or greater) or occlusion.

Skeleton: Degenerative changes in the cervical spine. No acute
osseous abnormality.

Other neck: No acute process.

Upper chest: No focal pulmonary opacity.

Review of the MIP images confirms the above findings

CTA HEAD FINDINGS

Anterior circulation: No significant stenosis, proximal occlusion,
aneurysm, or vascular malformation.

Posterior circulation: No significant stenosis, proximal occlusion,
aneurysm, or vascular malformation.

Venous sinuses: Patent.

Anatomic variants: Fetal origin of the right PCA.

Review of the MIP images confirms the above findings

CTV HEAD FINDINGS

No evidence of venous sinus thrombosis or stenosis.
IMPRESSION: 1. No intracranial large vessel occlusion or hemodynamically
significant stenosis in the neck.
2. No venous sinus stenosis or thrombosis.

## 2021-08-05 IMAGING — CT CT HEAD CODE STROKE
3 series · 16 of 47 positions shown, 19 images · non-contrast
Comparison: [DATE]

CLINICAL DATA: Code stroke.  Left arm weakness

EXAM:
CT HEAD WITHOUT CONTRAST
TECHNIQUE: Contiguous axial images were obtained from the base of the skull
through the vertex without intravenous contrast.

[Series 2: head 5.0 st · axial · 0.39mm/px · z∈[-274,-138]mm · 10 of 33 slices shown, 13 images]
[im 3/33  brain]
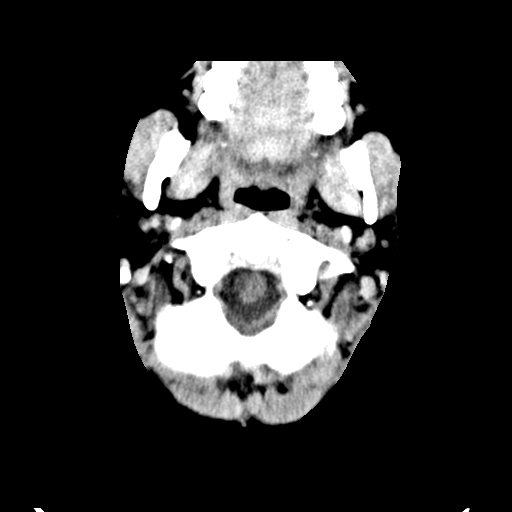
[im 3/33  bone]
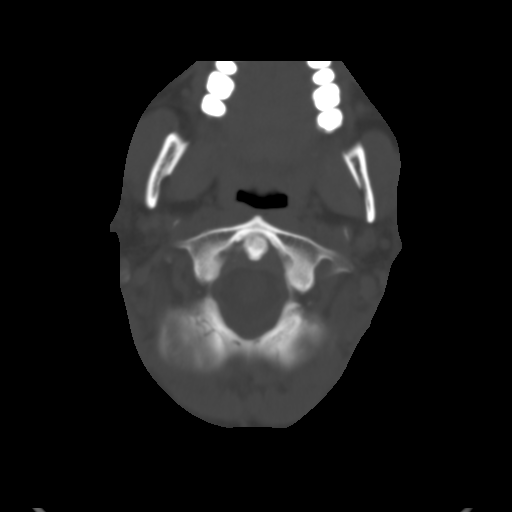
[im 6/33  brain]
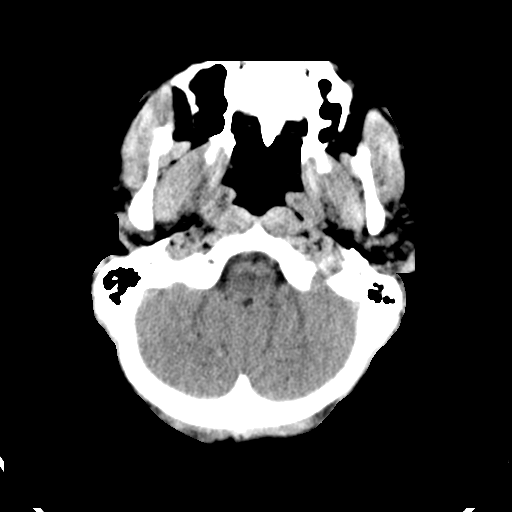
[im 9/33  brain]
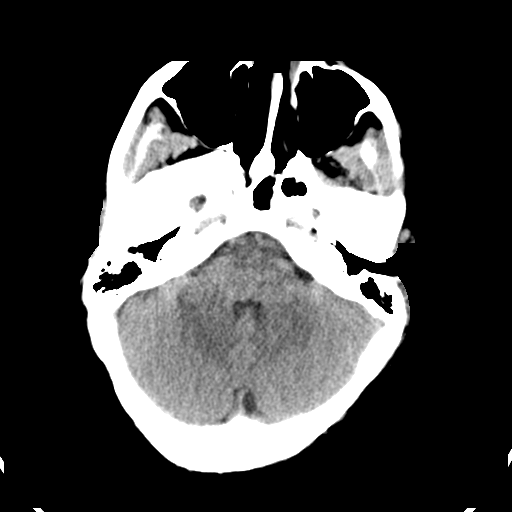
[im 12/33  brain]
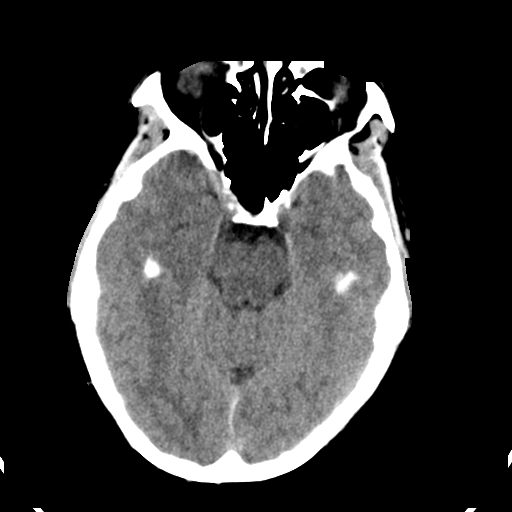
[im 15/33  brain]
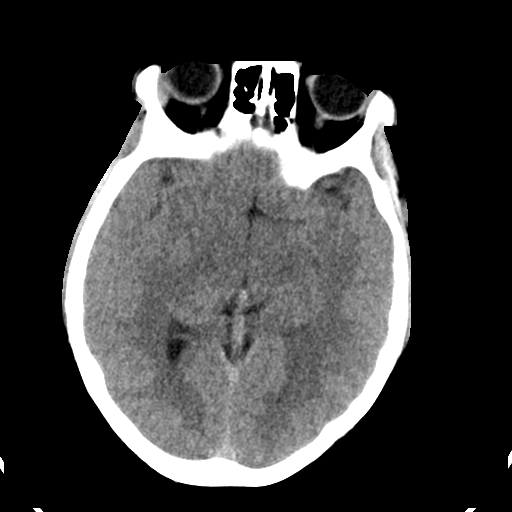
[im 15/33  bone]
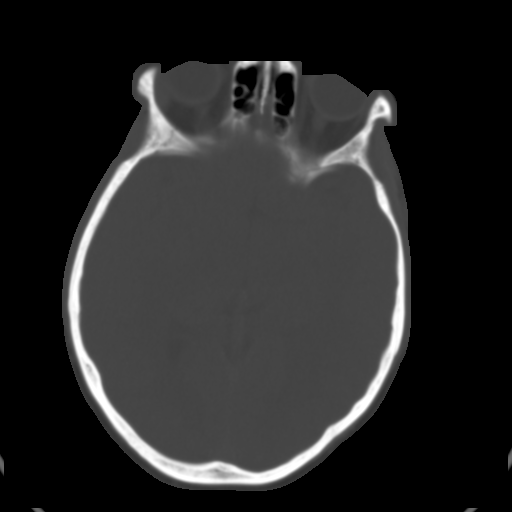
[im 18/33  brain]
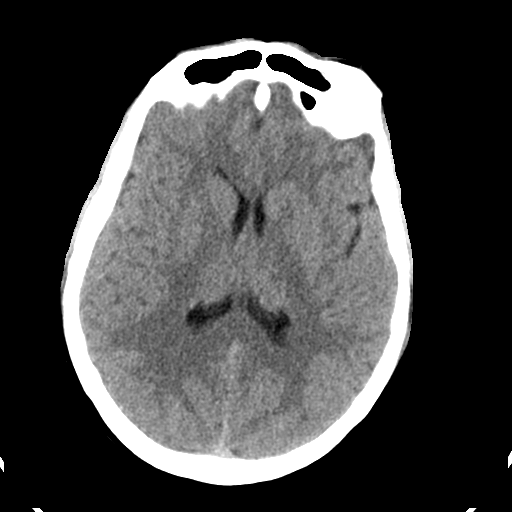
[im 21/33  brain]
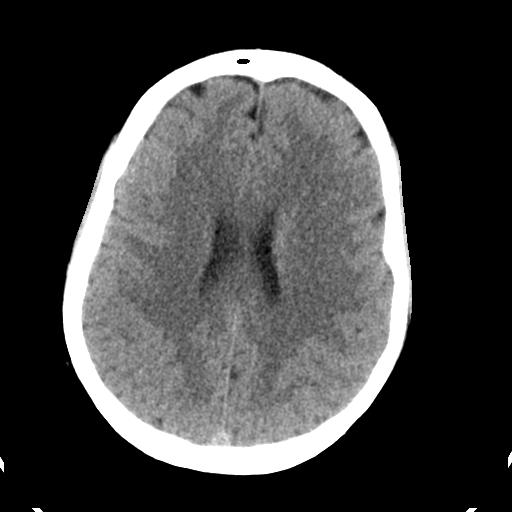
[im 25/33  brain]
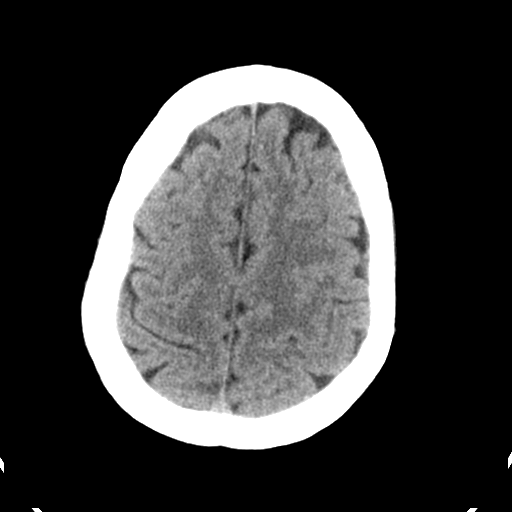
[im 27/33  brain]
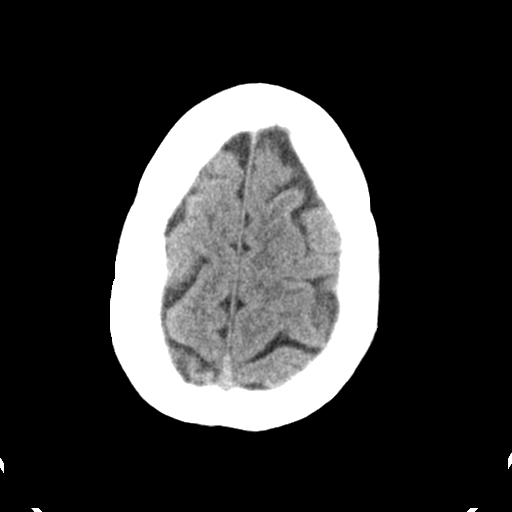
[im 27/33  bone]
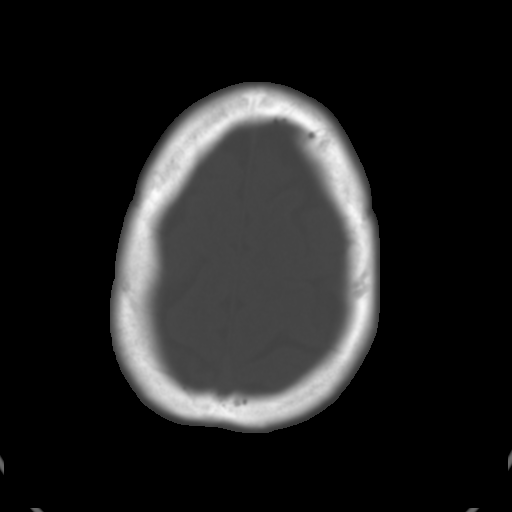
[im 30/33  brain]
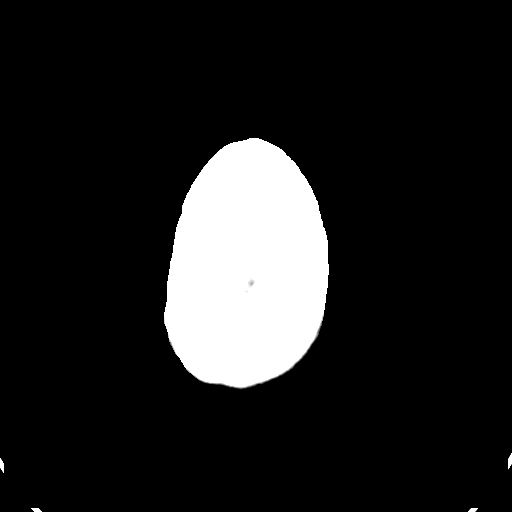

[Series 5: head 3.0 cor st · coronal · 0.32mm/px · 3 of 67 slices shown]
[im 23/67  brain]
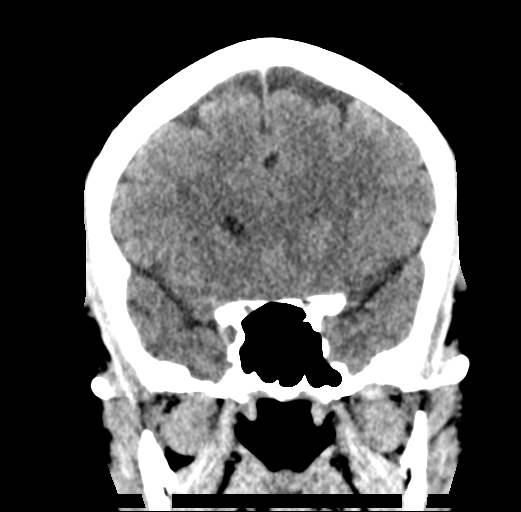
[im 30/67  brain]
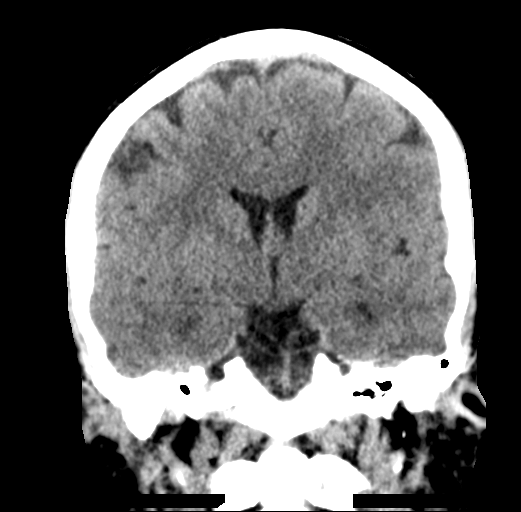
[im 37/67  brain]
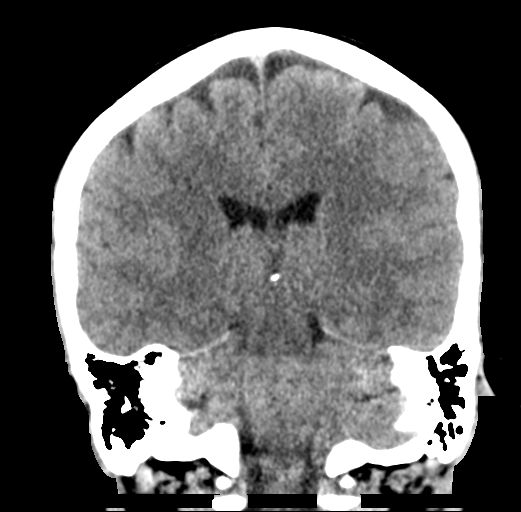

[Series 6: head 3.0 sag st · sagittal · 0.32mm/px · 3 of 55 slices shown]
[im 19/55  brain]
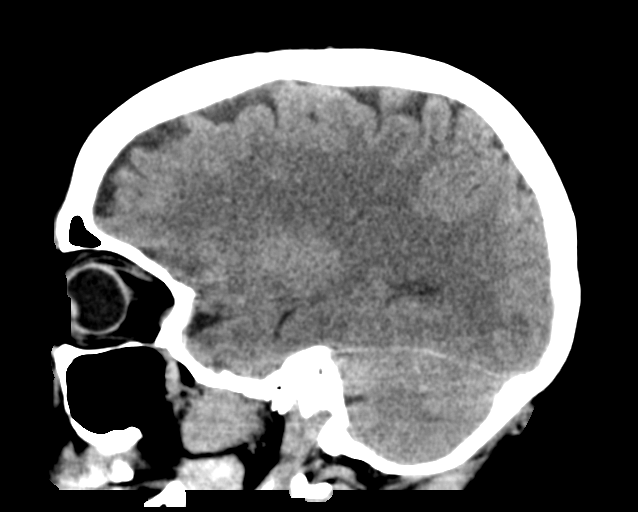
[im 28/55  brain]
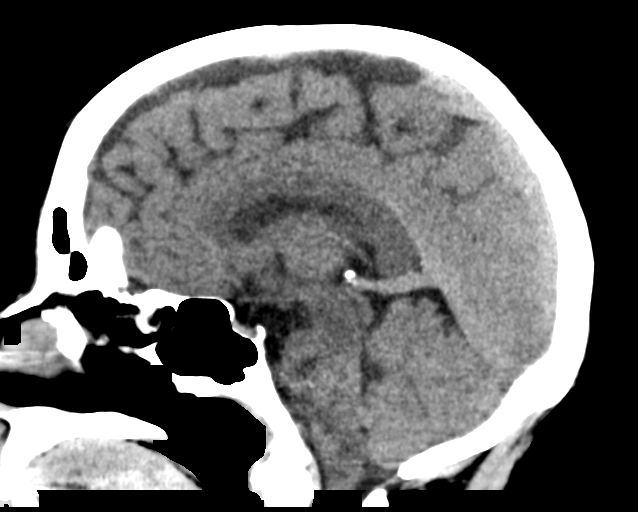
[im 37/55  brain]
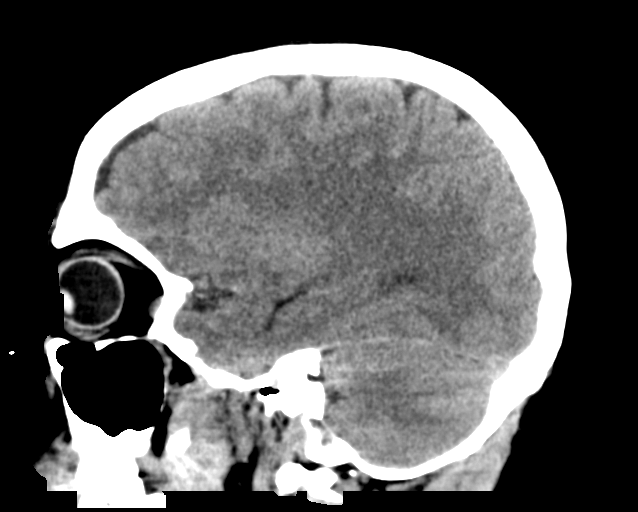

[16 of 47 positions shown; findings below may reference images not displayed]

FINDINGS: Brain: No evidence of acute infarction, hemorrhage, cerebral edema,
mass, mass effect, or midline shift. Ventricles and sulci are normal
for age. No extra-axial fluid collection.

Vascular: No hyperdense vessel or unexpected calcification.

Skull: Normal. Negative for fracture or focal lesion.

Sinuses/Orbits: No acute finding.

Other: The mastoid air cells are well aerated.

ASPECTS ([place] Stroke Program Early CT Score)

- Ganglionic level infarction (caudate, lentiform nuclei, internal
capsule, insula, M1-M3 cortex): 7

- Supraganglionic infarction (M4-M6 cortex): 3

Total score (0-10 with 10 being normal): 10
IMPRESSION: 1. No acute intracranial process.
2. ASPECTS is 10.

Code stroke imaging results were communicated on [DATE] at [DATE] to provider Dr. ALNAJJAR text paging.

## 2021-08-05 MED ORDER — ACETAMINOPHEN 325 MG PO TABS: 325.0000 mg | ORAL_TABLET | Freq: Once | ORAL | Status: DC

## 2021-08-05 MED ORDER — KETOROLAC TROMETHAMINE 15 MG/ML IJ SOLN
15.0000 mg | Freq: Once | INTRAMUSCULAR | Status: DC
Start: 2021-08-05 — End: 2021-08-06

## 2021-08-05 MED ORDER — IOHEXOL 350 MG/ML SOLN
75.0000 mL | Freq: Once | INTRAVENOUS | Status: AC | PRN
  Administered 2021-08-05: 75 mL via INTRAVENOUS

## 2021-08-05 MED ORDER — PROCHLORPERAZINE EDISYLATE 10 MG/2ML IJ SOLN: 10.0000 mg | Freq: Once | INTRAMUSCULAR | Status: DC

## 2021-08-05 MED ORDER — LACTATED RINGERS IV BOLUS: 1000.0000 mL | Freq: Once | INTRAVENOUS | Status: DC

## 2021-08-05 MED ORDER — DIPHENHYDRAMINE HCL 50 MG/ML IJ SOLN
12.5000 mg | Freq: Once | INTRAMUSCULAR | Status: DC
Start: 2021-08-05 — End: 2021-08-06

## 2021-08-05 NOTE — Progress Notes (Signed)
CTA of head and neck: No intracranial large vessel occlusion or hemodynamically significant stenosis in the neck.  CTV: No venous sinus stenosis or thrombosis.  Awaiting MRI brain.   Electronically signed: Dr. Kerney Elbe

## 2021-08-05 NOTE — ED Triage Notes (Signed)
Pt BIBA via [facility] for stroke-like symptoms. Pt stated that she was driving and began feeling nausea, dizziness and blurred vision. Pt stated that she had stroke back in December and has been on pradaxa since the previous CVA, but pt states that symptoms today are reminiscent of stroke back in December. PTA PIV to the LUA, VSS, and zofran 4mg  IV.

## 2021-08-05 NOTE — ED Notes (Signed)
Neurology at bedside with patient performing bedside assessment.

## 2021-08-05 NOTE — ED Notes (Signed)
ED Provider at bedside. 

## 2021-08-05 NOTE — ED Notes (Signed)
Patient transported to MRI 

## 2021-08-05 NOTE — ED Provider Notes (Signed)
[facility] EMERGENCY DEPARTMENT Provider Note   CSN: [id number] Arrival date & time: 08/05/21  1710     History Chief Complaint  Patient presents with   Dizziness   Nausea   Blurred Vision   Stroke Like Symptoms    Same symptoms from CVA back Dec 2021    Sydney [name] is a 55 y.o. female.  HPI This is a 55 year old female with history of dural venous sinus thrombosis who presents with dizziness, nausea, and vomiting.  Patient reports she was feeling otherwise well this morning and then had sudden onset of an nausea around 245.  Symptoms persisted for about 30 minutes and then she called EMS for transport to the hospital.  She denies any slurred speech, focal weakness, or facial droop.  Dizziness described as room spinning and is worse with ambulation.  Having difficulty ambulating secondary to the dizziness.  Patient has been on Pradaxa since diagnosis with prior venous sinus thrombosis and denies any missed doses. Patient reports some baseline discoordination on L after prior thrombosis.     Past Medical History:  Diagnosis Date   Anemia    Blood clots in brain    Headache    History of cold sores    HPV (human papilloma virus) infection    Post-operative nausea and vomiting     Patient Active Problem List   Diagnosis Date Noted   Hot flashes 03/24/2021   Elevated blood protein 03/24/2021   Chronic internal jugular vein thrombosis, left ([place]) 12/06/2020   Acute embolism and thrombosis of left internal jugular vein ([place]) 11/12/2020   Chronic anticoagulation 11/12/2020   Cerebral venous sinus thrombosis 10/28/2020   Iron deficiency anemia 09/10/2019   CKD (chronic kidney disease) stage 3, GFR 30-59 ml/min ([place]) 09/10/2019    Past Surgical History:  Procedure Laterality Date   BLEPHAROPLASTY  2005   CESAREAN SECTION  2002     OB History     Gravida  1   Para  1   Term      Preterm      AB      Living  1      SAB      IAB       Ectopic      Multiple      Live Births              Family History  Problem Relation Age of Onset   Lung cancer Maternal Grandmother    Lung cancer Maternal Grandfather    Colon cancer Neg Hx    Colon polyps Neg Hx    Esophageal cancer Neg Hx    Rectal cancer Neg Hx    Stomach cancer Neg Hx     Social History   Tobacco Use   Smoking status: Never   Smokeless tobacco: Never  Vaping Use   Vaping Use: Never used  Substance Use Topics   Alcohol use: Never   Drug use: Never    Home Medications Prior to Admission medications   Medication Sig Start Date End Date Taking? Authorizing Provider  acetaminophen (TYLENOL) 500 MG tablet Take 500 mg by mouth daily as needed for headache.   Yes [provider]  atorvastatin (LIPITOR) 40 MG tablet Take 1 tablet (40 mg total) by mouth daily. 11/12/20  Yes Kuneff, Renee A, DO  Biotin 5 MG TABS Take 5 mg by mouth at bedtime. Chewable   Yes [provider]  Multiple Vitamin (MULTIVITAMIN) tablet  Take 1 tablet by mouth daily.   Yes [provider]  PRADAXA 150 MG CAPS capsule TAKE 1 CAPSULE (150 MG TOTAL) BY MOUTH EVERY 12 (TWELVE) HOURS. Patient taking differently: Take 150 mg by mouth 2 (two) times daily. 04/04/21  Yes Kuneff, Renee A, DO  valACYclovir (VALTREX) 1000 MG tablet 2 tabs at onset, rpt dose once in 12 hours. Patient taking differently: Take 1,000 mg by mouth daily as needed (Cold Sores). 06/01/21  Yes Kuneff, Renee A, DO    Allergies    Doxycycline  Review of Systems   Review of Systems  Constitutional:  Negative for chills and fever.  HENT:  Negative for ear pain and sore throat.   Eyes:  Negative for pain and visual disturbance.  Respiratory:  Negative for cough and shortness of breath.   Cardiovascular:  Negative for chest pain and palpitations.  Gastrointestinal:  Positive for nausea and vomiting. Negative for abdominal pain.  Genitourinary:  Negative for dysuria and hematuria.   Musculoskeletal:  Positive for gait problem. Negative for arthralgias and back pain.  Skin:  Negative for color change and rash.  Neurological:  Positive for dizziness. Negative for seizures and syncope.  All other systems reviewed and are negative.  Physical Exam Updated Vital Signs BP 127/75 (BP Location: Right Arm)   Pulse (!) 48   Temp 98.3 F (36.8 C) (Oral)   Resp 14   Ht 5\' 2"  (1.575 m)   Wt 59.9 kg   SpO2 100%   BMI 24.15 kg/m   Physical Exam Vitals and nursing note reviewed.  Constitutional:      General: She is not in acute distress.    Appearance: She is well-developed.  HENT:     Head: Normocephalic and atraumatic.  Eyes:     Conjunctiva/sclera: Conjunctivae normal.  Cardiovascular:     Rate and Rhythm: Normal rate and regular rhythm.     Heart sounds: No murmur heard. Pulmonary:     Effort: Pulmonary effort is normal. No respiratory distress.     Breath sounds: Normal breath sounds.  Abdominal:     Palpations: Abdomen is soft.     Tenderness: There is no abdominal tenderness.  Musculoskeletal:     Cervical back: Neck supple.  Skin:    General: Skin is warm and dry.  Neurological:     Mental Status: She is alert.     Cranial Nerves: No cranial nerve deficit.     Motor: No weakness.     Coordination: Coordination abnormal.     Comments: Gait ataxia. Abnormal finger to nose on L.     ED Results / Procedures / Treatments   Labs (all labs ordered are listed, but only abnormal results are displayed) Labs Reviewed  RESP PANEL BY RT-PCR (FLU A&B, COVID) ARPGX2 - Abnormal; Notable for the following components:      Result Value   SARS Coronavirus 2 by RT PCR POSITIVE (*)    All other components within normal limits  PROTIME-INR - Abnormal; Notable for the following components:   Prothrombin Time 15.7 (*)    INR 1.3 (*)    All other components within normal limits  APTT - Abnormal; Notable for the following components:   aPTT 42 (*)    All other  components within normal limits  CBC - Abnormal; Notable for the following components:   Hemoglobin 11.9 (*)    All other components within normal limits  COMPREHENSIVE METABOLIC PANEL - Abnormal; Notable for the following  components:   Sodium 133 (*)    Creatinine, Ser 1.03 (*)    All other components within normal limits  URINALYSIS, ROUTINE W REFLEX MICROSCOPIC - Abnormal; Notable for the following components:   Color, Urine STRAW (*)    All other components within normal limits  I-STAT CHEM 8, ED - Abnormal; Notable for the following components:   BUN 23 (*)    All other components within normal limits  ETHANOL  DIFFERENTIAL  RAPID URINE DRUG SCREEN, HOSP PERFORMED  CBG MONITORING, ED  I-STAT BETA HCG BLOOD, ED ([facility], [facility], [facility] ONLY)    EKG EKG Interpretation  Date/Time:  Friday August 05 2021 17:38:49 EDT Ventricular Rate:  49 PR Interval:    QRS Duration: 116 QT Interval:  472 QTC Calculation: 427 R Axis:   89 Text Interpretation: Normal sinus rhythm but artifact Nonspecific intraventricular conduction delay Borderline T abnormalities, anterior leads Confirmed by Lennice Sites ([id number]) on 08/05/2021 6:00:25 PM  Radiology MR BRAIN WO CONTRAST  Result Date: 08/06/2021 CLINICAL DATA:  Follow-up examination for acute stroke. EXAM: MRI HEAD WITHOUT CONTRAST TECHNIQUE: Multiplanar, multiecho pulse sequences of the brain and surrounding structures were obtained without intravenous contrast. COMPARISON:  Prior CTs from earlier the same day as well as previous MRI from 11/17/2020. FINDINGS: Brain: Cerebral volume within normal limits. Mild scattered patchy T2 signal abnormality seen involving the supratentorial cerebral white matter and pons, most likely mild chronic microvascular ischemic disease. No evidence for acute or subacute infarct. Gray-white matter differentiation maintained. No encephalomalacia to suggest chronic or interval cortical infarction. No evidence for acute or chronic  intracranial hemorrhage. No mass lesion, mass effect or midline shift. No hydrocephalus. There has been interval development of smooth dural thickening overlying both cerebral hemispheres (series 11, image 17). Additionally, there is suspected trace bilateral subdural hygromas, measuring no more than 1-2 mm (series 17, image 14). Mild partial effacement of the prepontine cistern as compared to prior brain MRI with suggestion of mild sagging of the brain at the foramen magnum, and narrowing of the midbrain-pontine angle. Constellation of findings can be seen the setting of CSF hypotension. Pituitary gland remains within normal limits. Midline structures intact. Vascular: Major intracranial vascular flow voids are maintained. Skull and upper cervical spine: Craniocervical junction otherwise unremarkable. Somewhat diffusely decreased T1 signal intensity seen within the visualized bone marrow, nonspecific, but could be related to patient history of anemia. No focal marrow replacing lesion. No scalp soft tissue abnormality. Sinuses/Orbits: Globes and orbital soft tissues demonstrate no acute finding. Mild scattered mucosal thickening noted within the ethmoidal air cells. Paranasal sinuses are otherwise clear. Trace left mastoid effusion noted, of doubtful significance. Inner ear structures grossly normal. Other: None. IMPRESSION: 1. Interval development of smooth dural thickening overlying both cerebral hemispheres with suspected trace bilateral subdural hygromas. Findings are nonspecific, and can be seen in the setting of CSF hypotension. Changes from previously identified dural sinus thrombosis could also be considered, although no demonstrable thrombosis seen on prior CTV from earlier today. Changes from prior intervention or possibly meningitis could also be considered. Correlation with LP with opening pressures and CSF studies suggested as clinically warranted. 2. No other acute intracranial abnormality.  Electronically Signed   By: Jeannine Boga M.D.   On: 08/06/2021 01:04   CT VENOGRAM HEAD  Result Date: 08/05/2021 CLINICAL DATA:  Left arm weakness, dizziness, nausea EXAM: CT ANGIOGRAPHY HEAD AND NECK TECHNIQUE: Multidetector CT imaging of the head and neck was performed using the standard protocol during  bolus administration of intravenous contrast. Multiplanar CT image reconstructions and MIPs were obtained to evaluate the vascular anatomy. Carotid stenosis measurements (when applicable) are obtained utilizing NASCET criteria, using the distal internal carotid diameter as the denominator. CONTRAST:  32mL OMNIPAQUE IOHEXOL 350 MG/ML SOLN COMPARISON:  Same day CT head, CT head 10/28/2020, MRV 10/28/2020 and 06/01/2021, MRA 10/30/2019 twin. FINDINGS: CT HEAD FINDINGS Brain: No evidence of acute infarction, hemorrhage, cerebral edema, mass, mass effect, or midline shift. Ventricles and sulci are within normal limits for age. No extra-axial fluid collection. Vascular: No hyperdense vessel or unexpected calcification. Skull: Normal. Negative for fracture or focal lesion. Sinuses/Orbits: No acute finding. Other: The mastoid air cells are well aerated. CTA NECK FINDINGS Aortic arch: Standard branching. Imaged portion shows no evidence of aneurysm or dissection. No significant stenosis of the major arch vessel origins. Right carotid system: No evidence of dissection, stenosis (50% or greater) or occlusion. Left carotid system: No evidence of dissection, stenosis (50% or greater) or occlusion. Vertebral arteries: Codominant. No evidence of dissection, stenosis (50% or greater) or occlusion. Skeleton: Degenerative changes in the cervical spine. No acute osseous abnormality. Other neck: No acute process. Upper chest: No focal pulmonary opacity. Review of the MIP images confirms the above findings CTA HEAD FINDINGS Anterior circulation: No significant stenosis, proximal occlusion, aneurysm, or vascular  malformation. Posterior circulation: No significant stenosis, proximal occlusion, aneurysm, or vascular malformation. Venous sinuses: Patent. Anatomic variants: Fetal origin of the right PCA. Review of the MIP images confirms the above findings CTV HEAD FINDINGS No evidence of venous sinus thrombosis or stenosis. IMPRESSION: 1. No intracranial large vessel occlusion or hemodynamically significant stenosis in the neck. 2. No venous sinus stenosis or thrombosis. Electronically Signed   By: Merilyn Baba M.D.   On: 08/05/2021 19:27   CT HEAD CODE STROKE WO CONTRAST  Result Date: 08/05/2021 CLINICAL DATA:  Code stroke.  Left arm weakness EXAM: CT HEAD WITHOUT CONTRAST TECHNIQUE: Contiguous axial images were obtained from the base of the skull through the vertex without intravenous contrast. COMPARISON:  10/28/2020 FINDINGS: Brain: No evidence of acute infarction, hemorrhage, cerebral edema, mass, mass effect, or midline shift. Ventricles and sulci are normal for age. No extra-axial fluid collection. Vascular: No hyperdense vessel or unexpected calcification. Skull: Normal. Negative for fracture or focal lesion. Sinuses/Orbits: No acute finding. Other: The mastoid air cells are well aerated. ASPECTS [facility] Stroke Program Early CT Score) - Ganglionic level infarction (caudate, lentiform nuclei, internal capsule, insula, M1-M3 cortex): 7 - Supraganglionic infarction (M4-M6 cortex): 3 Total score (0-10 with 10 being normal): 10 IMPRESSION: 1. No acute intracranial process. 2. ASPECTS is 10. Code stroke imaging results were communicated on 08/05/2021 at 6:45 pm to provider Dr. Marilynne Halsted text paging. Electronically Signed   By: Merilyn Baba M.D.   On: 08/05/2021 18:46   UE VENOUS DUPLEX  Result Date: 08/06/2021 UPPER VENOUS STUDY  Patient Name:  KEARA PAGLIARULO Fromm  Date of Exam:   08/06/2021 Medical Rec #: [id number]             Accession #:    [id number] Date of Birth: 06/23/1966            Patient Gender: F  Patient Age:   45 years Exam Location:  [facility] Procedure:      VAS [place] UPPER EXTREMITY VENOUS DUPLEX Referring Phys: Addison Lank --------------------------------------------------------------------------------  Indications: F/U left IJV DVT Other Indications: History of venous sinus thrombosis. Risk Factors: Thrombus within the left sigmoid  sinus and visualized left internal jugular vein by MRV of the head 10/28/20 Anticoagulation: Pradaxa. Comparison Study: No prior study on file Performing Technologist: Sharion Dove RVS  Examination Guidelines: A complete evaluation includes B-mode imaging, spectral Doppler, color Doppler, and power Doppler as needed of all accessible portions of each vessel. Bilateral testing is considered an integral part of a complete examination. Limited examinations for reoccurring indications may be performed as noted.  Right Findings: +----------+------------+---------+-----------+----------+-------+ RIGHT     CompressiblePhasicitySpontaneousPropertiesSummary +----------+------------+---------+-----------+----------+-------+ Subclavian               Yes       Yes                      +----------+------------+---------+-----------+----------+-------+  Left Findings: +----------+------------+---------+-----------+----------+--------------+ LEFT      CompressiblePhasicitySpontaneousProperties   Summary     +----------+------------+---------+-----------+----------+--------------+ IJV           Full       Yes       Yes                 Chronic     +----------+------------+---------+-----------+----------+--------------+ Subclavian               Yes       Yes                             +----------+------------+---------+-----------+----------+--------------+ Axillary                 Yes       Yes                             +----------+------------+---------+-----------+----------+--------------+ Brachial      Full                                                  +----------+------------+---------+-----------+----------+--------------+ Radial        Full                                                 +----------+------------+---------+-----------+----------+--------------+ Ulnar                                               Not visualized +----------+------------+---------+-----------+----------+--------------+ Cephalic      Full                                                 +----------+------------+---------+-----------+----------+--------------+ Basilic       Full                                                 +----------+------------+---------+-----------+----------+--------------+  Summary:  Right: No evidence of thrombosis in the subclavian.  Left: Findings consistent with chronic deep vein thrombosis  involving the left internal jugular vein.  *See table(s) above for measurements and observations.     Preliminary    CT ANGIO HEAD NECK W WO CM (CODE STROKE)  Result Date: 08/05/2021 CLINICAL DATA:  Left arm weakness, dizziness, nausea EXAM: CT ANGIOGRAPHY HEAD AND NECK TECHNIQUE: Multidetector CT imaging of the head and neck was performed using the standard protocol during bolus administration of intravenous contrast. Multiplanar CT image reconstructions and MIPs were obtained to evaluate the vascular anatomy. Carotid stenosis measurements (when applicable) are obtained utilizing NASCET criteria, using the distal internal carotid diameter as the denominator. CONTRAST:  67mL OMNIPAQUE IOHEXOL 350 MG/ML SOLN COMPARISON:  Same day CT head, CT head 10/28/2020, MRV 10/28/2020 and 06/01/2021, MRA 10/30/2019 twin. FINDINGS: CT HEAD FINDINGS Brain: No evidence of acute infarction, hemorrhage, cerebral edema, mass, mass effect, or midline shift. Ventricles and sulci are within normal limits for age. No extra-axial fluid collection. Vascular: No hyperdense vessel or unexpected calcification. Skull: Normal. Negative  for fracture or focal lesion. Sinuses/Orbits: No acute finding. Other: The mastoid air cells are well aerated. CTA NECK FINDINGS Aortic arch: Standard branching. Imaged portion shows no evidence of aneurysm or dissection. No significant stenosis of the major arch vessel origins. Right carotid system: No evidence of dissection, stenosis (50% or greater) or occlusion. Left carotid system: No evidence of dissection, stenosis (50% or greater) or occlusion. Vertebral arteries: Codominant. No evidence of dissection, stenosis (50% or greater) or occlusion. Skeleton: Degenerative changes in the cervical spine. No acute osseous abnormality. Other neck: No acute process. Upper chest: No focal pulmonary opacity. Review of the MIP images confirms the above findings CTA HEAD FINDINGS Anterior circulation: No significant stenosis, proximal occlusion, aneurysm, or vascular malformation. Posterior circulation: No significant stenosis, proximal occlusion, aneurysm, or vascular malformation. Venous sinuses: Patent. Anatomic variants: Fetal origin of the right PCA. Review of the MIP images confirms the above findings CTV HEAD FINDINGS No evidence of venous sinus thrombosis or stenosis. IMPRESSION: 1. No intracranial large vessel occlusion or hemodynamically significant stenosis in the neck. 2. No venous sinus stenosis or thrombosis. Electronically Signed   By: Merilyn Baba M.D.   On: 08/05/2021 19:27    Procedures Procedures   Medications Ordered in ED Medications  iohexol (OMNIPAQUE) 350 MG/ML injection 75 mL (75 mLs Intravenous Contrast Given 08/05/21 1846)    ED Course  I have reviewed the triage vital signs and the nursing notes.  Pertinent labs & imaging results that were available during my care of the patient were reviewed by me and considered in my medical decision making (see chart for details).    MDM Rules/Calculators/A&P                          Patient is stable on initial exam but appears very  uncomfortable.  Has left-sided dysmetria on finger-to-nose but normal heel-to-shin.  Unable to ambulate secondary to dizziness.  No nystagmus on exam.  Code stroke called and imaging obtained.  CT head and CTA of the head and neck negative for acute pathology.  Neurology recommends CT venogram and MRI for further evaluation especially given patient's history. Patient positive for COVID but had known positive test 2 weeks ago.  Labs otherwise unremarkable.  CT venogram without evidence of venous sinus thrombosis and MRI brain without acute areas of ischemia or other intracranial abnormality.  On reassessment patient's symptoms have resolved without intervention.  She declines any headache medicines.  She ambulates around the  room without difficulty and without dizziness.  Unclear cause of her symptoms but she is stable for discharge home.  She will follow-up with her neurologist next week.  Given strict return precautions and she expressed understanding.  Discharged home with her son.    Final Clinical Impression(s) / ED Diagnoses Final diagnoses:  Dizziness  Nausea    Rx / DC Orders ED Discharge Orders          Ordered    UE VENOUS DUPLEX  Status:  Canceled        08/06/21 0136    UE VENOUS DUPLEX        08/06/21 0141             Coralee Pesa, MD 08/07/21 0007    Lennice Sites, DO 08/07/21 1545

## 2021-08-05 NOTE — Code Documentation (Addendum)
Stroke Response Nurse Documentation Code Documentation  Sydney [name] is a 55 y.o. female arriving to [facility] ED via [facility] on 08/05/21 with past medical hx of prior CVA. On Pradaxa (dabigatran) twice a day. Code stroke was activated by ED.   Patient was running errands with son where she was LKW at 1445 and started complaining of nausea/vomiting, and blurry vision. Getting out of the car patient became dizzy as well. Patient states "it felt like my last stroke." Upon arriving to ED, left sided weakness was noted.   Stroke team at the bedside on activation. Labs already drawn and patient previously cleared for CT by Dr. Ronnald Nian. Patient to CT with team. NIHSS 2, see documentation for details and code stroke times. Patient with left limb ataxia and left decreased sensation on exam. The following imaging was completed:  CT, CTA head and neck and CT head venogram. Patient is not a candidate for IV Thrombolytic due to taking Pradaxa. Patient is not a candidate for IR due to too mild to treat.  Care/Plan: q1h mNIHSS. Permissive hypertension (<220/110). Bedside handoff with ED RN Lanny Hurst.    Candace Cruise K  Stroke Response RN

## 2021-08-05 NOTE — ED Provider Notes (Signed)
I have personally seen and examined the patient. I have reviewed the documentation on PMH/FH/Soc Hx. I have discussed the plan of care with the resident and patient.  I have reviewed and agree with the resident's documentation. Please see associated encounter note.  Briefly, the patient is a 55 y.o. female here with dizziness, difficulty with walking.  Shortly after evaluation of her patient was made a code stroke.  History of sinus thrombosis.  On Pradaxa.  About 3 hours ago had sudden onset of dizziness, may be left arm weakness, ataxia.  Appears to maybe have some trace left arm weakness, abnormal ataxia in her left upper arm and difficulty with ambulation.  No visual field loss.  Normal speech.  Otherwise normal strength and sensation.  Not a tPA candidate as patient is on Pradaxa.  Code stroke to evaluate for LVO.  CT of the head shows no acute process.  CTA imaging is pending.  We will follow neurology recommendations.  Patient also has CTV.  Please see my resident's note for further results, evaluation, disposition of the patient.  Does not seem to be a cardiac process given normal sinus rhythm.  This chart was dictated using voice recognition software.  Despite best efforts to proofread,  errors can occur which can change the documentation meaning.    EKG Interpretation  Date/Time:  Friday August 05 2021 17:38:49 EDT Ventricular Rate:  49 PR Interval:    QRS Duration: 116 QT Interval:  472 QTC Calculation: 427 R Axis:   89 Text Interpretation: Normal sinus rhythm but artifact Nonspecific intraventricular conduction delay Borderline T abnormalities, anterior leads Confirmed by Lennice Sites ([id number]) on 08/05/2021 6:00:25 PM          Lennice Sites, DO 08/05/21 1947

## 2021-08-05 NOTE — ED Notes (Signed)
Called [facility] to activate cold stroke per Dr Ronnald Nian

## 2021-08-05 NOTE — Consult Note (Addendum)
Neurology Consultation  Reason for Consult: Left-sided weakness Referring Physician: Dr. Ronnald Nian  CC: Dizziness, blurred vision, nausea  History is obtained from: Patient, chart review  HPI: Sydney [name] is a 55 y.o. female with a medical history significant for recent COVID infection in September 2022 and dural venous sinus thrombosis in the setting of hormonal treatment in December 2021 on home Pradaxa with minimal left-sided deficits.  Patient states that today she was waiting in her car to get gas and when she got out of the car she suddenly became dizzy, nauseated, with an unsteady gait, and blurred vision.  She states that this time she called her son who is a Agricultural consultant who found her to be hypertensive with a blood pressure of 176/92. She states that her dizziness and nausea are worse with movement and indicates that they were worse with a higher blood pressure reading. Her son brought her to the emergency department where she was noted to have some subtle left arm and leg weakness, nystagmus on lateral gaze, and left-sided sensory deficits and a code stroke was activated.  Patient's last dose of Pradaxa was this morning.  LKW: 14:45 TNK given?: no, patient on anticoagulation with Pradaxa. IR Thrombectomy? No, presentation is not consistent with LVO.  Imaging is without evidence of LVO. Modified Rankin Scale: 0-Completely asymptomatic and back to baseline post- stroke  ROS: A complete ROS was performed and is negative except as noted in the HPI.   Past Medical History:  Diagnosis Date   Anemia    Blood clots in brain    Headache    History of cold sores    HPV (human papilloma virus) infection    Post-operative nausea and vomiting    Past Surgical History:  Procedure Laterality Date   BLEPHAROPLASTY  2005   CESAREAN SECTION  2002   Family History  Problem Relation Age of Onset   Lung cancer Maternal Grandmother    Lung cancer Maternal Grandfather    Colon cancer Neg  Hx    Colon polyps Neg Hx    Esophageal cancer Neg Hx    Rectal cancer Neg Hx    Stomach cancer Neg Hx    Social History:   reports that she has never smoked. She has never used smokeless tobacco. She reports that she does not drink alcohol and does not use drugs.  Medications No current facility-administered medications for this encounter.  Current Outpatient Medications:    acetaminophen (TYLENOL) 500 MG tablet, Take 500 mg by mouth daily as needed for headache., Disp: , Rfl:    atorvastatin (LIPITOR) 40 MG tablet, Take 1 tablet (40 mg total) by mouth daily., Disp: 90 tablet, Rfl: 3   Biotin 5 MG TABS, Take 5 mg by mouth at bedtime. Chewable, Disp: , Rfl:    Multiple Vitamin (MULTIVITAMIN) tablet, Take 1 tablet by mouth daily., Disp: , Rfl:    PRADAXA 150 MG CAPS capsule, TAKE 1 CAPSULE (150 MG TOTAL) BY MOUTH EVERY 12 (TWELVE) HOURS. (Patient taking differently: Take 150 mg by mouth 2 (two) times daily.), Disp: 180 capsule, Rfl: 1   valACYclovir (VALTREX) 1000 MG tablet, 2 tabs at onset, rpt dose once in 12 hours. (Patient taking differently: Take 1,000 mg by mouth daily as needed (Cold Sores).), Disp: 14 tablet, Rfl: 5  Exam: Current vital signs: BP 138/81   Pulse 73   Temp 98.2 F (36.8 C) (Oral)   Resp (!) 28   Ht 5\' 2"  (1.575 m)  Wt 57.2 kg   SpO2 100%   BMI 23.05 kg/m  Vital signs in last 24 hours: Temp:  [98.2 F (36.8 C)] 98.2 F (36.8 C) (10/07 1731) Pulse Rate:  [57-80] 73 (10/07 1900) Resp:  [19-28] 28 (10/07 1900) BP: (135-142)/(72-81) 138/81 (10/07 1900) SpO2:  [98 %-100 %] 100 % (10/07 1900) Weight:  [57.2 kg] 57.2 kg (10/07 1739)  GENERAL: Awake, alert, in no acute distress Psych: Patient appears anxious but remains calm and cooperative with examination Head: Normocephalic and atraumatic, without obvious abnormality EENT: Normal conjunctivae, dry mucous membranes, no OP obstruction LUNGS: Normal respiratory effort. Non-labored breathing on room air CV:  Sinus bradycardia on telemetry, no pedal edema ABDOMEN: Soft, non-tender, non-distended Extremities: warm, well perfused, without obvious deformity  NEURO:  Mental Status: Awake, alert, and oriented to person, place, time, and situation. She is able to provide a clear and coherent history of present illness. Speech/Language: speech is intact without dysarthria. Naming, repetition, fluency, and comprehension intact without aphasia. No neglect is noted Cranial Nerves:  II: PERRL. Visual fields full. III, IV, VI: EOM with direction changing nystagmus, 2-3 beats and somewhat subtle but reproducible. No vertical skew deviation. No catch up saccades on head impulse testing. Diplopia in upward gaze V: She has mildly decreased sensation to the left side of the face to light touch VII: Face is symmetric resting and smiling. VIII: Hearing is intact to voice IX, X: Palate elevation is symmetric. Phonation normal.  XI: Normal sternocleidomastoid and trapezius muscle strength XII: Tongue protrudes midline without fasciculations.   Motor: There is minimal weakness of the left upper and lower extremity compared to the right (chronic) but without vertical drift on assessment. Tone is normal. Bulk is normal.  Sensation: Minimal decrease in sensation reported to the left upper and lower extremity (chronic) Coordination: Mild dysmetria to the left upper extremity. HKS intact bilaterally. Gait: Deferred  NIHSS: 1a Level of Conscious.: 0 1b LOC Questions: 0 1c LOC Commands: 0 2 Best Gaze: 0 3 Visual: 0 4 Facial Palsy: 0 5a Motor Arm - left: 0 5b Motor Arm - Right: 0 6a Motor Leg - Left: 0 6b Motor Leg - Right: 0 7 Limb Ataxia: 1 8 Sensory: 1 9 Best Language: 0 10 Dysarthria: 0 11 Extinct. and Inatten.: 0 TOTAL: 2  Labs I have reviewed labs in epic and the results pertinent to this consultation are: CBC    Component Value Date/Time   WBC 8.4 08/05/2021 1832   RBC 3.96 08/05/2021 1832    HGB 11.9 (L) 08/05/2021 1832   HGB 12.4 06/21/2021 1452   HCT 36.6 08/05/2021 1832   PLT 164 08/05/2021 1832   PLT 191 06/21/2021 1452   MCV 92.4 08/05/2021 1832   MCH 30.1 08/05/2021 1832   MCHC 32.5 08/05/2021 1832   RDW 13.3 08/05/2021 1832   LYMPHSABS 0.9 08/05/2021 1832   MONOABS 0.4 08/05/2021 1832   EOSABS 0.0 08/05/2021 1832   BASOSABS 0.0 08/05/2021 1832   CMP     Component Value Date/Time   NA 133 (L) 08/05/2021 1832   K 3.7 08/05/2021 1832   CL 100 08/05/2021 1832   CO2 24 08/05/2021 1832   GLUCOSE 97 08/05/2021 1832   BUN 17 08/05/2021 1832   CREATININE 1.03 (H) 08/05/2021 1832   CREATININE 1.15 (H) 06/21/2021 1452   CREATININE 1.29 (H) 11/12/2020 1557   CALCIUM 9.3 08/05/2021 1832   PROT 7.6 08/05/2021 1832   ALBUMIN 3.8 08/05/2021 1832   AST  24 08/05/2021 1832   AST 32 06/21/2021 1452   ALT 13 08/05/2021 1832   ALT 18 06/21/2021 1452   ALKPHOS 55 08/05/2021 1832   BILITOT 0.4 08/05/2021 1832   BILITOT 0.7 06/21/2021 1452   GFRNONAA >60 08/05/2021 1832   GFRNONAA 57 (L) 06/21/2021 1452   GFRAA >60 05/07/2019 1635   Lipid Panel     Component Value Date/Time   CHOL 239 (H) 08/27/2020 1433   TRIG 73 08/27/2020 1433   HDL 79 08/27/2020 1433   CHOLHDL 3.0 08/27/2020 1433   VLDL 11.2 08/20/2019 1536   LDLCALC 143 (H) 08/27/2020 1433   Imaging I have reviewed the images obtained:  CT-scan of the brain 08/05/2021: 1. No acute intracranial process. 2. ASPECTS is 10.  CTA, CTV of the brain personally reviewed by attending MD, agree with radiology: 1. No intracranial large vessel occlusion or hemodynamically significant stenosis in the neck. 2. No venous sinus stenosis or thrombosis.  MRI examination of the brain pending  Assessment: This is a 55 year old woman with CVST in December 2021 on Pradaxa, presenting with sudden onset headache, nausea, vomiting, dizziness, with exam consistent with brainstem dysfunction given direction changing nystagmus and  ataxia.  Differential includes brainstem stroke or complex migraine.  From a stroke risk perspective she is medically optimized although she does have an acute infarct may need to hold Pradaxa for some time depending on size of infarct.  If infarct is too small to be seen on MRI, there will be minimal bleeding risk from continuing Pradaxa.  Code stroke recommendations included CTA and CTV given the patient's history  Additional recommendations: - MRI brain wo contrast - Patient discussed with Dr. Cheral Marker who will provide final plan pending review of MRI  Pt seen by NP/Neuro and later by MD. Note/plan to be edited by MD as needed.  Anibal Henderson, AGAC-NP [facility] Pager: [phone]  Attending Neurologist's note:  I personally saw this patient, gathering history, performing a full neurologic examination, reviewing relevant labs, personally reviewing relevant imaging including CT, CTA, CTV, and formulated the assessment and plan, adding the note above for completeness and clarity to accurately reflect my thoughts  Lesleigh Noe MD-PhD [facility] [phone]  Available 7 AM to 7 PM, outside these hours please contact Neurologist on call listed on [place] Performed by: Lorenza Chick   Total critical care time: 40 minutes  Critical care time was exclusive of separately billable procedures and treating other patients, and independent of time spent by NP.  Critical care was necessary to treat or prevent imminent or life-threatening deterioration.  Critical care was time spent personally by me on the following activities: development of treatment plan with patient and/or surrogate as well as nursing, discussions with consultants, evaluation of patient's response to treatment, examination of patient, obtaining history from patient or surrogate, ordering and performing treatments and interventions, ordering and review of laboratory studies,  ordering and review of radiographic studies, pulse oximetry and re-evaluation of patient's condition.

## 2021-08-06 ENCOUNTER — Ambulatory Visit (HOSPITAL_COMMUNITY): Admission: RE | Admit: 2021-08-06 | Payer: No Typology Code available for payment source | Source: Ambulatory Visit

## 2021-08-06 ENCOUNTER — Ambulatory Visit (HOSPITAL_BASED_OUTPATIENT_CLINIC_OR_DEPARTMENT_OTHER)
Admission: RE | Admit: 2021-08-06 | Discharge: 2021-08-06 | Disposition: A | Discharge status: Home / Self Care | Payer: No Typology Code available for payment source | Source: Ambulatory Visit | Attending: Emergency Medicine | Admitting: Emergency Medicine

## 2021-08-06 DIAGNOSIS — I82C12 Acute embolism and thrombosis of left internal jugular vein: Secondary | ICD-10-CM | POA: Insufficient documentation

## 2021-08-06 NOTE — ED Provider Notes (Signed)
I assumed care of this patient.  Please see previous provider note for further details of Hx, PE.  Briefly patient is a 55 y.o. female who presented with dizziness, nausea and difficulty walking.  Patient has a history of prior venous sinus thrombosis currently on Pradaxa.  Repeated imaging showed resolution of the venous sinus thrombosis.  Currently pending MRI to rule out stroke.  MRI negative but did reveal nonspecific changes that may be related to prior venous sinus thrombosis.  When speaking to the patient she reports that she also had a left IJ thrombus. On record review it does not appear that the patient has had imaging of the IJ to assess for resolution.  Venous imaging previously and from today does show evidence of left hemispheric venous congestion.  On the CT scan it appears that there is a possible thrombus.  Patient is currently asymptomatic and feels stable for discharge.  I have placed an order for ultrasound duplex to assess for possibility of persistent left IJ thrombus. Patient is already scheduled to see neurology in the next couple weeks. She is followed by hematology.  Recommended she return tomorrow the next day for the duplex and to follow-up with the above specialists.  The patient appears reasonably screened and/or stabilized for discharge and I doubt any other medical condition or other [facility] requiring further screening, evaluation, or treatment in the ED at this time prior to discharge. Safe for discharge with strict return precautions.  Disposition: Discharge  Condition: Good  I have discussed the results, Dx and Tx plan with the patient/family who expressed understanding and agree(s) with the plan. Discharge instructions discussed at length. The patient/family was given strict return precautions who verbalized understanding of the instructions. No further questions at time of discharge.    ED Discharge Orders          Ordered    UE VENOUS DUPLEX  Status:   Canceled        08/06/21 0136    UE VENOUS DUPLEX        08/06/21 0141             Follow Up: [name], DO [address] [place] [address] [phone]  Call  as needed, if symptoms do not improve or  worsen  Garvin Fila, MD [address] [place] [address] [phone]  Call  if symptoms do not improve or  worsen         Chastidy Ranker, Grayce Sessions, MD 08/06/21 0145

## 2021-08-06 NOTE — Discharge Instructions (Addendum)
IMPORTANT PATIENT INSTRUCTIONS:  You have been scheduled for an Outpatient Vascular Study at [facility].    If tomorrow is a Saturday, Sunday or holiday, please go to the [facility] Registration Desk at 11 am tomorrow morning and tell them you are there for a vascular study.   If tomorrow is a weekday (Monday-Friday), please go to [facility] Registration at 11 am and tell them you are there for a vascular study. 

## 2021-08-06 NOTE — Progress Notes (Signed)
VASCULAR LAB    Left upper extremity venous duplex has been performed.  See CV proc for preliminary results.   Sierah Lacewell, RVT 08/06/2021, 11:43 AM

## 2021-08-15 ENCOUNTER — Other Ambulatory Visit: Payer: Self-pay | Admitting: Obstetrics and Gynecology

## 2021-08-15 DIAGNOSIS — Z1231 Encounter for screening mammogram for malignant neoplasm of breast: Secondary | ICD-10-CM

## 2021-08-15 LAB — HM PAP SMEAR

## 2021-08-15 LAB — RESULTS CONSOLE HPV: CHL HPV: POSITIVE

## 2021-08-15 LAB — HM HIV SCREENING LAB: HM HIV Screening: NEGATIVE

## 2021-08-18 ENCOUNTER — Ambulatory Visit (INDEPENDENT_AMBULATORY_CARE_PROVIDER_SITE_OTHER): Payer: No Typology Code available for payment source | Admitting: Neurology

## 2021-08-18 ENCOUNTER — Encounter: Payer: Self-pay | Admitting: Neurology

## 2021-08-18 VITALS — BP 161/72 | HR 49 | Ht 62.0 in | Wt 127.0 lb

## 2021-08-18 DIAGNOSIS — G4452 New daily persistent headache (NDPH): Secondary | ICD-10-CM | POA: Diagnosis not present

## 2021-08-18 DIAGNOSIS — G08 Intracranial and intraspinal phlebitis and thrombophlebitis: Secondary | ICD-10-CM

## 2021-08-18 MED ORDER — ASPIRIN EC 81 MG PO TBEC: 81.0000 mg | DELAYED_RELEASE_TABLET | Freq: Every day | ORAL | 11 refills | Status: DC

## 2021-08-18 MED ORDER — TOPIRAMATE 25 MG PO TABS: 25.0000 mg | ORAL_TABLET | Freq: Two times a day (BID) | ORAL | 3 refills | Status: DC

## 2021-08-18 MED ORDER — BUTALBITAL-APAP-CAFFEINE 50-325-40 MG PO TABS: 1.0000 | ORAL_TABLET | Freq: Four times a day (QID) | ORAL | 3 refills | Status: DC | PRN

## 2021-08-18 NOTE — Progress Notes (Signed)
[facility] [address] [place]. [place] [address] [phone]       OFFICE FOLLOW UP VISIT NOTE  [name] Date of Birth:  02-26-1966 Medical Record Number:  [id number]   Referring MD: Heber  Reason for Referral: Sagittal sinus thrombosis  HPI: Initial visit 11/08/2020 Sydney [name] is a 55 year old Caucasian lady seen today for initial office consultation visit for cerebral venous sinus thrombosis.  She is accompanied by a friend Mudlogger.  History is obtained from them, review of electronic medical records and I personally reviewed pertinent imaging films in PACS. Patient has past medical history of stage III chronic kidney disease, anemia, on hormone replacement therapy with JUNEL estrogen product for hot flashes who presented on 10/28/2020 with new onset persistent headache for 2 days.  She states she had been dealing with some sinus and left ear infection and hypersensitivity to sound for the last 4 to 5 weeks.  States she had a COVID booster shot on November 5 with Pfizer vaccine since then she has been treated with courses of cefdinir.  Followed by amoxicillin and prednisone for ear infection   without much relief.  She was also having a lot of sinus pressure and facial pain.  She underwent CT maxillofacial in the ER which showed a left sigmoid sinus and jugular vein nonocclusive thrombus.  MRI scan of the brain and subsequently obtained which was normal except abnormal signal in the superior sagittal, left transverse and sigmoid sinus.  MRI of the brain and neck were unremarkable.  2D echo showed normal ejection fraction without cardiac source of embolism.  LDL cholesterol was 143 mg and hemoglobin A1c 5.5 on 08/27/2020.  She will complete hypercoagulable panel labs  all of which  were normal.  MR venogram showed nonocclusive thrombus of the mid superior societal sinus extending posteriorly as well as thrombus within the left sigmoid and left  internal jugular vein.  Patient was initially started on IV heparin and then transition to Pradaxa 150 twice daily.  Patient states that she is still having daily persistent headaches with she describes this as a viselike grip in bifrontal and bitemporal regions and constant.  She has been taking Topamax 25 mg twice daily but has not noticed any benefit or any side effects.  She feels a left ear hearing loss has improved though she has no increased sensitivity to sound in the left ear.  Her vision is also improved.  She however feels that she is at times dragging her left leg with walking though she has had no falls or injuries.  She denies any loss of vision and difficulty reading.  She states she has been drinking lots of fluids.  She has stopped using the estrogen hormone replacement medication.  She denies any prior history of deep vein thrombosis, pulmonary embolism, recurrent miscarriages, strokes, TIAs, migraines or seizures.  There is no family history of abnormal clotting in the legs, lungs of the brain. Update 01/17/2021 : She returns for follow-up after last visit 2 months ago.  Patient states she is feeling much better.  Her headaches are almost gone.  They are much milder and dull and rare.  She is tolerating Topamax 50 mg twice daily seems to be working.  She denies any side effects on it.  She remains on Pradaxa 150 mg twice daily which is tolerating well without bruising or bleeding.  She did have follow-up brain scans on 11/17/2020 and MRI scan of the brain showed  no abnormalities and MR venogram showed partial recanalization with partial filling defects in the mid section of the superior sagittal sinus and the left transverse sinus more or less unchanged compared to the previous study from a month ago.  She has no new complaints today Update 08/18/2021 : Patient is seen today for follow-up after last visit 7 months ago.  She states that she had been doing well until she had COVID in September of  this year when she had mostly allergy-like symptoms of sinus pressure and sneezing.  On 08/05/2021 she was seen in the ER with sudden onset of dizziness, nausea, blurred vision and vomiting.  Her blood pressure was found to be significantly elevated as per the patient but I do not find definite documentation of this in the ER notes.  A code stroke was activated and neurology evaluated her.  CT scan of the head was unremarkable CT venogram showed no evidence of sagittal sinus or transverse or sigmoid sinus thrombosis.  MRI scan of the brain without contrast was obtained which was unremarkable except for small dural thickening overlying both cerebral hemispheres suspected trace bilateral subdural hygromas which are nonspecific finding.  Changes from previously identified dural sinus thrombosis versus infection were mentioned.  Patient however did not have any fever or white count and was discharged home.  She states she had a persistent headache since then.  The headache is daily is the left temporal and vertex is severe in intensity 8/10.  She describes this as the head being held in a viselike grip.  She has been taking up to 4 tablets of Tylenol Extra Strength daily without much relief in the headache persists.  She does have some nausea and threw up just once.  She does have some light and sound sensitivity as well.  She denies any prior history of migraines.  She is worried that upper extremity venous duplex done on 08/06/2021 showed deep vein thrombosis of the left internal jugular vein but the CT venogram report did not mention this.  Looking back at her previous MRI from 10/28/2020 left internal jugular vein thrombosis has been mention.  So this is a chronic finding and not necessarily acute.  Patient has been on Pradaxa for now 10-1/2 months and is concerned whether she can come off it.  She denies any bleeding or bruising ROS:   14 system review of systems is positive for allergies, sinus pressure, sneezing,  nausea, light sensitivity headache and all other systems negative  PMH:  Past Medical History:  Diagnosis Date   Anemia    Blood clots in brain    Headache    History of cold sores    HPV (human papilloma virus) infection    Post-operative nausea and vomiting     Social History:  Social History   Socioeconomic History   Marital status: Divorced    Spouse name: Not on file   Number of children: Not on file   Years of education: Not on file   Highest education level: Not on file  Occupational History   Occupation: disability  Tobacco Use   Smoking status: Never   Smokeless tobacco: Never  Vaping Use   Vaping Use: Never used  Substance and Sexual Activity   Alcohol use: Never   Drug use: Never   Sexual activity: Not Currently  Other Topics Concern   Not on file  Social History Narrative   Lives with son   Right Handed   Drinks no caffeine  Social Determinants of Health   Financial Resource Strain: Not on file  Food Insecurity: Not on file  Transportation Needs: Not on file  Physical Activity: Not on file  Stress: Not on file  Social Connections: Not on file  Intimate Partner Violence: Not on file    Medications:   Current Outpatient Medications on File Prior to Visit  Medication Sig Dispense Refill   Biotin 5 MG TABS Take 5 mg by mouth at bedtime. Chewable     Multiple Vitamin (MULTIVITAMIN) tablet Take 1 tablet by mouth daily.     valACYclovir (VALTREX) 1000 MG tablet 2 tabs at onset, rpt dose once in 12 hours. (Patient taking differently: Take 1,000 mg by mouth daily as needed (Cold Sores).) 14 tablet 5   No current facility-administered medications on file prior to visit.    Allergies:   Allergies  Allergen Reactions   Doxycycline Nausea And Vomiting    Physical Exam General: well developed, well nourished middle-aged Caucasian lady, seated, in no evident distress Head: head normocephalic and atraumatic.   Neck: supple with no carotid or  supraclavicular bruits Cardiovascular: regular rate and rhythm, no murmurs Musculoskeletal: no deformity Skin:  no rash/petichiae Vascular:  Normal pulses all extremities  Neurologic Exam Mental Status: Awake and fully alert. Oriented to place and time. Recent and remote memory intact. Attention span, concentration and fund of knowledge appropriate. Mood and affect appropriate.  Appears anxious Cranial Nerves: Fundoscopic exam reveals sharp disc margins. Pupils equal, briskly reactive to light. Extraocular movements full without nystagmus. Visual fields full to confrontation. Hearing intact. Facial sensation intact. Face, tongue, palate moves normally and symmetrically.  Motor: Normal bulk and tone. Normal strength in all tested extremity muscles.  Tone slightly increased on the left compared to the right. Sensory.: intact to touch , pinprick , position and vibratory sensation.  Coordination: Rapid alternating movements normal in all extremities. Finger-to-nose and heel-to-shin performed accurately bilaterally. Gait and Station: Arises from chair without difficulty. Stance is normal. Gait demonstrates normal stride length and balance with slight dragging of the left leg.. Able to heel, toe and tandem walk without difficulty.  Reflexes: 3+ brisk and asymmetric and more prominent on the left.. Toes downgoing.     ASSESSMENT: 55 year old Caucasian lady with superior sagittal sinus and left jugular vein thrombosis likely multifactorial due to combination of being on estrogen-containing medications, COVID-vaccine booster shot.  New onset persistent daily headache for the last 2 weeks likely transformed migraine with some mixed tension headache features.     PLAN: I had a long discussion with the patient regarding her past history of cerebral venous sinus thrombosis as well as new complaints of daily persistent headache and discussed results of recent imaging studies and answered questions.  I  recommend she discontinue Pradaxa and changed to aspirin 81 mg daily as its been more than 9 months since she has been taking it.  She was advised never to go back on hormone replacement therapy or birth control pills.  Recommend trial of Topamax 25 mg twice daily as well as Fioricet 1 or 2 tablets as needed for symptomatic relief of her headache.  Check MRI scan of the brain with and without contrast and MR venogram as the previous study was done without contrast.  She will return for follow-up in the future in 3 months or call earlier if necessary. Greater than 50% time during this 45-minute visit was spent on counseling and coordination of care about her cerebral venous sinus thrombosis ,and  discussion about evaluation and treatment plan and answering questions.  Antony Contras, MD Note: This document was prepared with digital dictation and possible smart phrase technology. Any transcriptional errors that result from this process are unintentional.

## 2021-08-18 NOTE — Patient Instructions (Signed)
I had a long discussion with the patient regarding her past history of cerebral venous sinus thrombosis as well as new complaints of daily persistent headache and discussed results of recent imaging studies and answered questions.  I recommend she discontinue Pradaxa and changed to aspirin 81 mg daily as its been more than 9 months since she has been taking it.  She was advised never to go back on hormone replacement therapy or birth control pills.  Recommend trial of Topamax 25 mg twice daily as well as Fioricet 1 or 2 tablets as needed for symptomatic relief of her headache.  Check MRI scan of the brain with and without contrast and MR venogram as the previous study was done without contrast.  She will return for follow-up in the future in 3 months or call earlier if necessary.

## 2021-08-23 ENCOUNTER — Encounter (HOSPITAL_COMMUNITY): Payer: Self-pay

## 2021-08-23 ENCOUNTER — Telehealth: Payer: Self-pay

## 2021-08-23 ENCOUNTER — Other Ambulatory Visit: Payer: Self-pay

## 2021-08-23 ENCOUNTER — Emergency Department (HOSPITAL_COMMUNITY): Payer: No Typology Code available for payment source

## 2021-08-23 ENCOUNTER — Emergency Department (HOSPITAL_COMMUNITY)
Admission: EM | Admit: 2021-08-23 | Discharge: 2021-08-23 | Disposition: A | Discharge status: Home / Self Care | Payer: No Typology Code available for payment source | Attending: Emergency Medicine | Admitting: Emergency Medicine

## 2021-08-23 DIAGNOSIS — R519 Headache, unspecified: Secondary | ICD-10-CM | POA: Insufficient documentation

## 2021-08-23 DIAGNOSIS — N183 Chronic kidney disease, stage 3 unspecified: Secondary | ICD-10-CM | POA: Insufficient documentation

## 2021-08-23 DIAGNOSIS — H539 Unspecified visual disturbance: Secondary | ICD-10-CM | POA: Insufficient documentation

## 2021-08-23 DIAGNOSIS — R000 Tachycardia, unspecified: Secondary | ICD-10-CM | POA: Diagnosis not present

## 2021-08-23 DIAGNOSIS — Z8616 Personal history of COVID-19: Secondary | ICD-10-CM | POA: Diagnosis not present

## 2021-08-23 DIAGNOSIS — Z7982 Long term (current) use of aspirin: Secondary | ICD-10-CM | POA: Diagnosis not present

## 2021-08-23 LAB — COMPREHENSIVE METABOLIC PANEL
ALT: 12 U/L (ref 0–44)
AST: 25 U/L (ref 15–41)
Albumin: 4.4 g/dL (ref 3.5–5.0)
Alkaline Phosphatase: 56 U/L (ref 38–126)
Anion gap: 7 (ref 5–15)
BUN: 18 mg/dL (ref 6–20)
CO2: 22 mmol/L (ref 22–32)
Calcium: 9.5 mg/dL (ref 8.9–10.3)
Chloride: 109 mmol/L (ref 98–111)
Creatinine, Ser: 1.17 mg/dL — ABNORMAL HIGH (ref 0.44–1.00)
GFR, Estimated: 56 mL/min — ABNORMAL LOW (ref >60)
Glucose, Bld: 94 mg/dL (ref 70–99)
Potassium: 3.7 mmol/L (ref 3.5–5.1)
Sodium: 138 mmol/L (ref 135–145)
Total Bilirubin: 0.6 mg/dL (ref 0.3–1.2)
Total Protein: 9.1 g/dL — ABNORMAL HIGH (ref 6.5–8.1)

## 2021-08-23 LAB — DIFFERENTIAL
Abs Immature Granulocytes: 0.01 10*3/uL (ref 0.00–0.07)
Basophils Absolute: 0 10*3/uL (ref 0.0–0.1)
Basophils Relative: 1 %
Eosinophils Absolute: 0 10*3/uL (ref 0.0–0.5)
Eosinophils Relative: 0 %
Immature Granulocytes: 0 %
Lymphocytes Relative: 19 %
Lymphs Abs: 1 10*3/uL (ref 0.7–4.0)
Monocytes Absolute: 0.2 10*3/uL (ref 0.1–1.0)
Monocytes Relative: 4 %
Neutro Abs: 3.9 10*3/uL (ref 1.7–7.7)
Neutrophils Relative %: 76 %

## 2021-08-23 LAB — CBC
HCT: 38.2 % (ref 36.0–46.0)
Hemoglobin: 12.6 g/dL (ref 12.0–15.0)
MCH: 30.2 pg (ref 26.0–34.0)
MCHC: 33 g/dL (ref 30.0–36.0)
MCV: 91.6 fL (ref 80.0–100.0)
Platelets: 187 10*3/uL (ref 150–400)
RBC: 4.17 MIL/uL (ref 3.87–5.11)
RDW: 13.8 % (ref 11.5–15.5)
WBC: 5.1 10*3/uL (ref 4.0–10.5)
nRBC: 0 % (ref 0.0–0.2)

## 2021-08-23 LAB — I-STAT CHEM 8, ED
BUN: 17 mg/dL (ref 6–20)
Calcium, Ion: 1.37 mmol/L (ref 1.15–1.40)
Chloride: 109 mmol/L (ref 98–111)
Creatinine, Ser: 1.1 mg/dL — ABNORMAL HIGH (ref 0.44–1.00)
Glucose, Bld: 90 mg/dL (ref 70–99)
HCT: 40 % (ref 36.0–46.0)
Hemoglobin: 13.6 g/dL (ref 12.0–15.0)
Potassium: 3.7 mmol/L (ref 3.5–5.1)
Sodium: 142 mmol/L (ref 135–145)
TCO2: 23 mmol/L (ref 22–32)

## 2021-08-23 LAB — I-STAT BETA HCG BLOOD, ED (MC, WL, AP ONLY): I-stat hCG, quantitative: <5 m[IU]/mL (ref <5)

## 2021-08-23 LAB — PROTIME-INR
INR: 1.6 — ABNORMAL HIGH (ref 0.8–1.2)
Prothrombin Time: 18.6 seconds — ABNORMAL HIGH (ref 11.4–15.2)

## 2021-08-23 LAB — CBG MONITORING, ED: Glucose-Capillary: 77 mg/dL (ref 70–99)

## 2021-08-23 LAB — APTT: aPTT: 66 seconds — ABNORMAL HIGH (ref 24–36)

## 2021-08-23 IMAGING — MR MR HEAD WO/W CM
15 series · 48 of 48 positions shown · non-contrast
Comparison: CT/CTA/CTV head, and MR head [DATE], MRI/MRV head
[DATE]

CLINICAL DATA: Left-sided headache, blurred vision, leg numbness

EXAM:
MRI HEAD WITHOUT CONTRAST
MRV HEAD WITHOUT CONTRAST
TECHNIQUE: Multiplanar, multi-echo pulse sequences of the brain and surrounding
structures were acquired without intravenous contrast. Angiographic
images of the intracranial venous structures were acquired using MRV
technique without intravenous contrast.

[Series 5: DWI · axial · 3.0mm · 1.36mm/px · z∈[-60,+92]mm · 4 of 104 slices shown (1 of 4)]
[im 1/104]
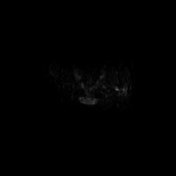
[im 35/104]
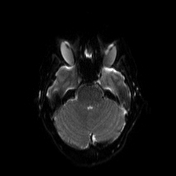
[im 69/104]
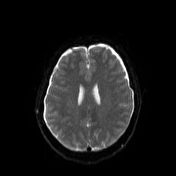
[im 104/104]
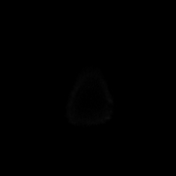

[Series 6: DWI · axial · 3.0mm · 1.36mm/px · z∈[-60,+92]mm · 2 of 51 slices shown (2 of 4)]
[im 1/51]
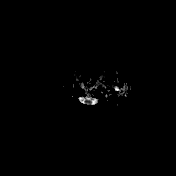
[im 51/51]
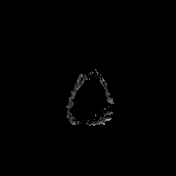

[Series 7: T1 · sagittal · 5.0mm · 0.75mm/px · 1 of 24 slices shown (1 of 2)]
[im 1/24]
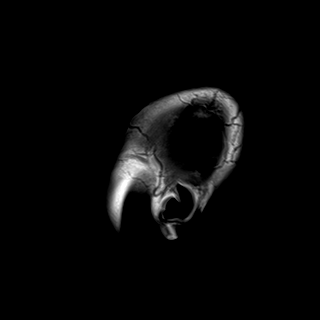

[Series 8: T2 · axial · 5.0mm · 0.62mm/px · 1 of 25 slices shown (1 of 2)]
[im 1/25]
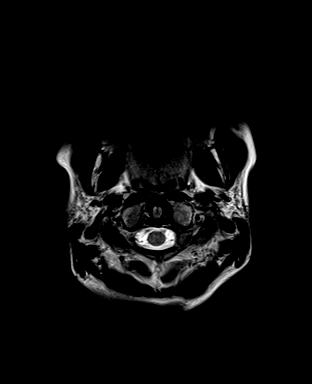

[Series 9: swi_images · axial · 3.0mm · 0.75mm/px · z∈[-53,+99]mm · 2 of 52 slices shown]
[im 1/52]
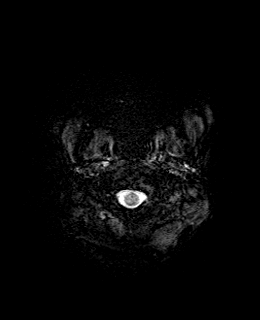
[im 52/52]
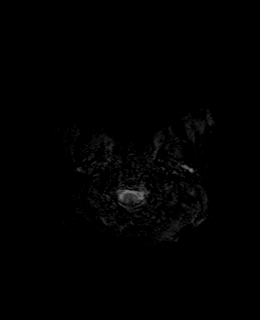

[Series 11: T1 · axial · 1.0mm · 0.94mm/px · z∈[-54,+104]mm · 6 of 160 slices shown (2 of 2)]
[im 1/160]
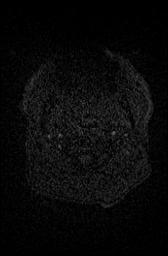
[im 32/160]
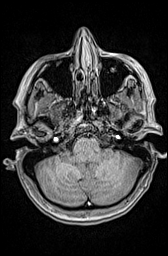
[im 64/160]
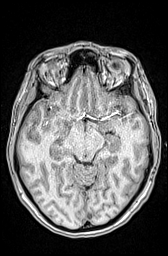
[im 96/160]
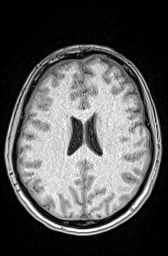
[im 128/160]
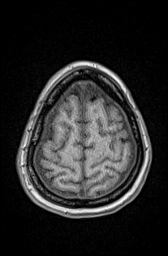
[im 160/160]
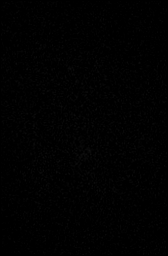

[Series 12: T1 post-contrast · sagittal · 1.0mm · 0.94mm/px · 6 of 144 slices shown (1 of 5)]
[im 1/144]
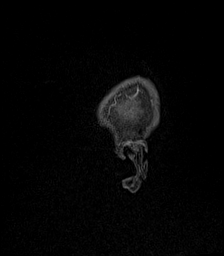
[im 29/144]
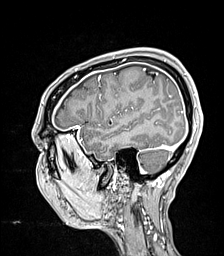
[im 58/144]
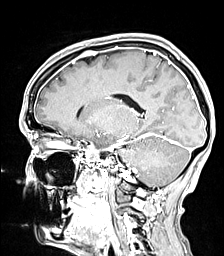
[im 86/144]
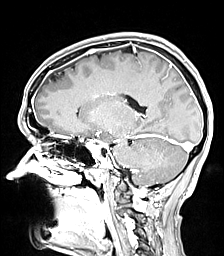
[im 115/144]
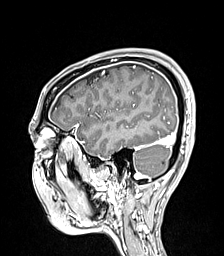
[im 144/144]
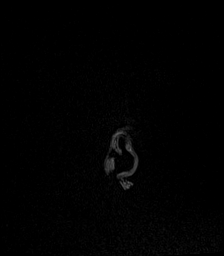

[Series 13: T1 post-contrast · coronal · 1.0mm · 0.94mm/px · 6 of 160 slices shown (2 of 5)]
[im 1/160]
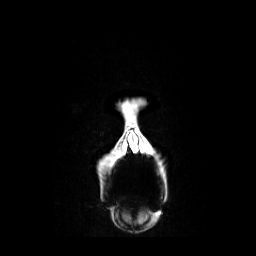
[im 32/160]
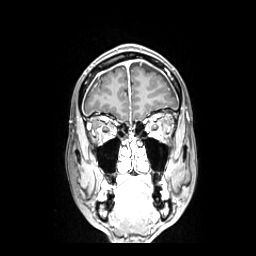
[im 64/160]
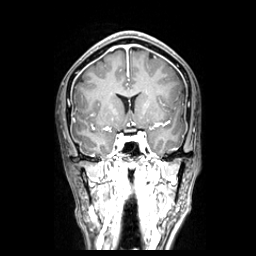
[im 96/160]
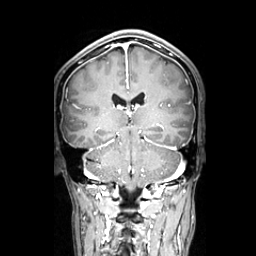
[im 128/160]
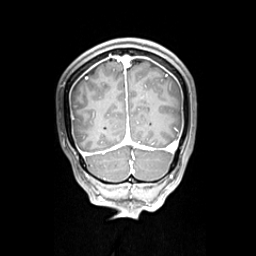
[im 160/160]
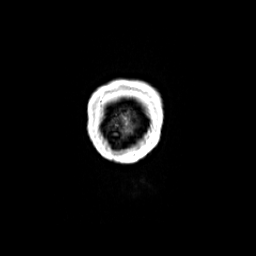

[Series 13: DWI · coronal · 5.0mm · 1.31mm/px · 3 of 72 slices shown (3 of 4)]
[im 1/72]
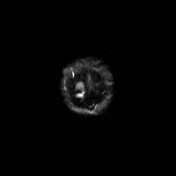
[im 36/72]
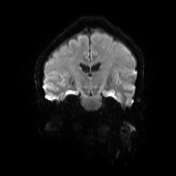
[im 72/72]
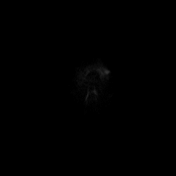

[Series 14: T1 post-contrast · axial · 1.0mm · 0.94mm/px · z∈[-74,+99]mm · 6 of 160 slices shown (3 of 5)]
[im 1/160]
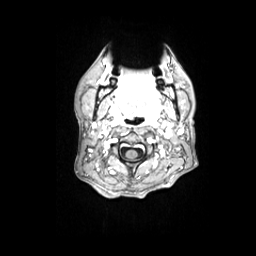
[im 32/160]
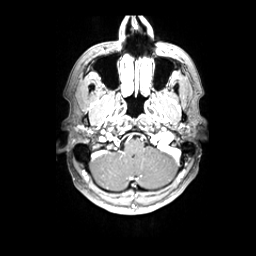
[im 64/160]
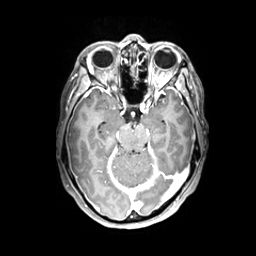
[im 96/160]
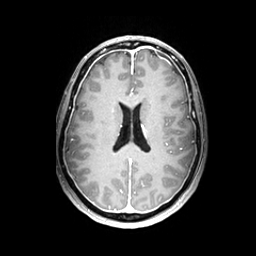
[im 128/160]
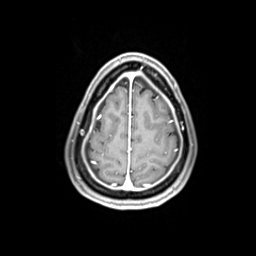
[im 160/160]
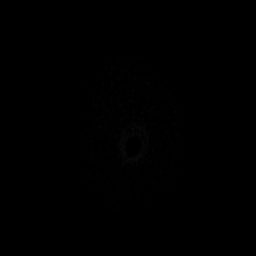

[Series 14: DWI · coronal · 5.0mm · 1.31mm/px · 1 of 36 slices shown (4 of 4)]
[im 1/36]
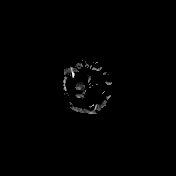

[Series 15: FLAIR · axial · 3.0mm · 0.47mm/px · z∈[-48,+98]mm · 2 of 50 slices shown]
[im 1/50]
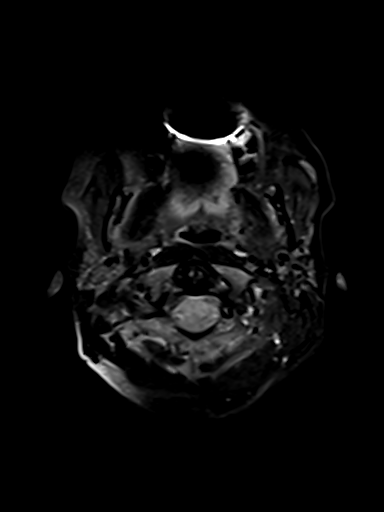
[im 50/50]
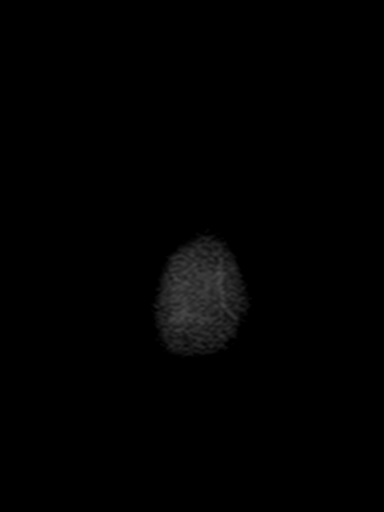

[Series 15: T1 post-contrast · axial · 1.0mm · 0.94mm/px · z∈[-61,+97]mm · 6 of 160 slices shown (4 of 5)]
[im 1/160]
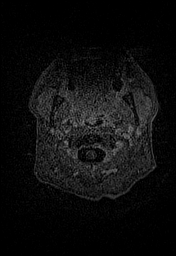
[im 32/160]
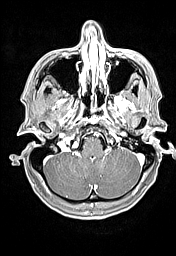
[im 64/160]
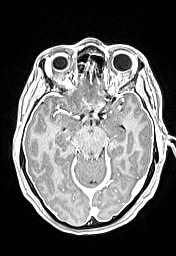
[im 96/160]
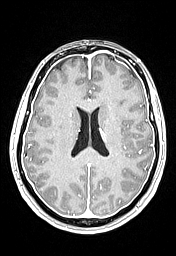
[im 128/160]
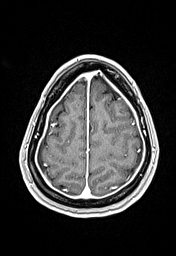
[im 160/160]
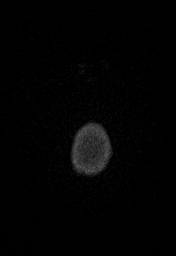

[Series 16: T2 · coronal · 5.0mm · 0.57mm/px · 1 of 27 slices shown (2 of 2)]
[im 1/27]
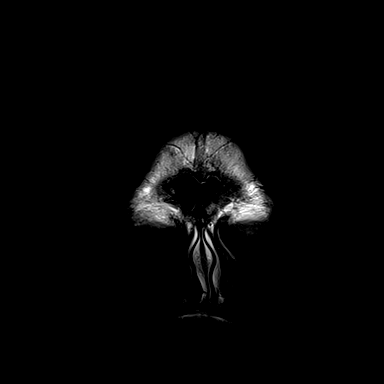

[Series 16: T1 post-contrast · coronal · 5.0mm · 0.43mm/px · 1 of 28 slices shown (5 of 5)]
[im 1/28]
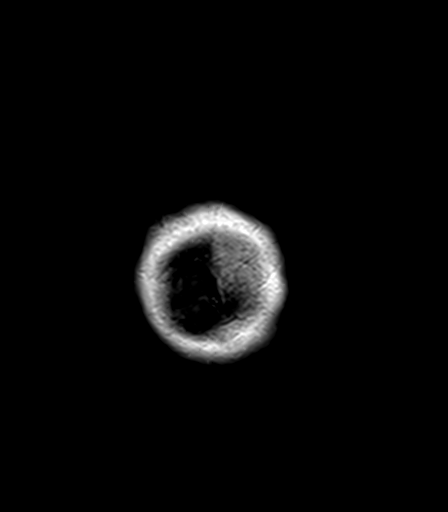

[48 of 48 positions shown; findings below may reference images not displayed]

FINDINGS: MRI HEAD WITHOUT CONTRAST

Brain: There is smooth dural thickening and enhancement with
associated small subdural hygromas overlying both cerebral
hemispheres, minimally increased in size compared to the study from
[DATE]. There is no significant mass effect on the underlying
brain parenchyma. There is no midline shift.

A few small scattered foci of nonspecific FLAIR signal abnormality
in the subcortical periventricular white matter are unchanged.

There is no abnormal mass lesion. There is no other abnormal
enhancement. There is no inferior cerebellar tonsillar descent.

Vascular: Normal flow voids.

Skull and upper cervical spine: Normal marrow signal.

Sinuses/Orbits: The paranasal sinuses are clear. The globes and
orbits are unremarkable.

Other: None.

MR VENOGRAM WITHOUT CONTRAST

On the time-of-flight images, there is diminished flow related
enhancement in the right transverse sinus, but the sinus enhances
normally on the postcontrast T1 images. There is also loss of flow
related signal in the posterior aspect of the superior sagittal
sinus there is no convincing evidence of dural venous sinus
thrombosis.
IMPRESSION: 1. Slight interval increase in size of bilateral subdural hygromas
compared to the study from [DATE] with associated smooth
pachymeningeal thickening and enhancement. Findings could again be
seen in the setting of intracranial hypotension. Meningitis is
considered less likely but not entirely excluded by imaging.
2. No condensing evidence of dural venous sinus thrombosis.

## 2021-08-23 IMAGING — MR MR MRV HEAD WO/W CM
6 of 9 series · 38 of 48 positions shown · non-contrast
Comparison: CT/CTA/CTV head, and MR head [DATE], MRI/MRV head
[DATE]

CLINICAL DATA: Left-sided headache, blurred vision, leg numbness

EXAM:
MRI HEAD WITHOUT CONTRAST
MRV HEAD WITHOUT CONTRAST
TECHNIQUE: Multiplanar, multi-echo pulse sequences of the brain and surrounding
structures were acquired without intravenous contrast. Angiographic
images of the intracranial venous structures were acquired using MRV
technique without intravenous contrast.

[Series 4: cor 2d · coronal · 2.5mm · 0.69mm/px · 6 of 116 slices shown]
[im 1/116]
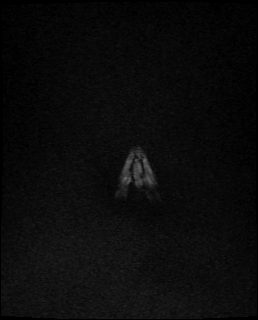
[im 24/116]
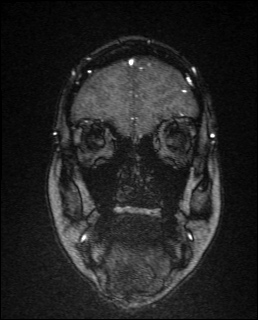
[im 47/116]
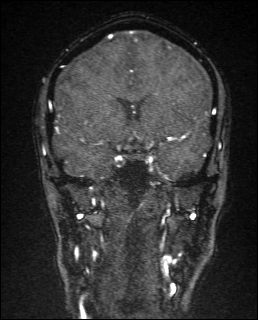
[im 70/116]
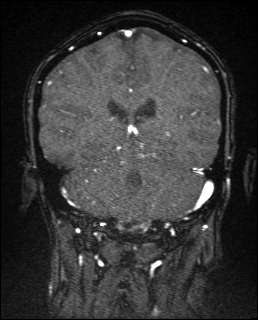
[im 93/116]
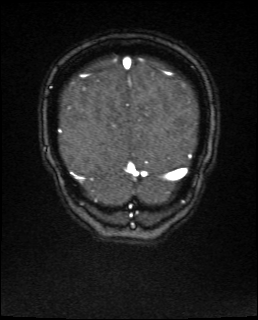
[im 116/116]
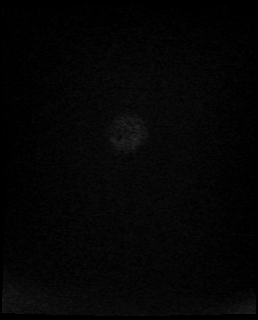

[Series 12: T1 post-contrast · sagittal · 1.0mm · 0.94mm/px · 7 of 144 slices shown (1 of 5)]
[im 1/144]
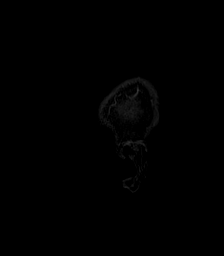
[im 24/144]
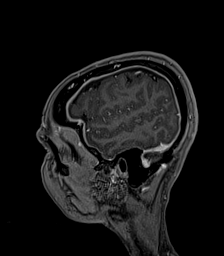
[im 48/144]
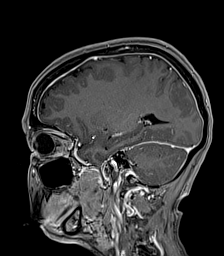
[im 72/144]
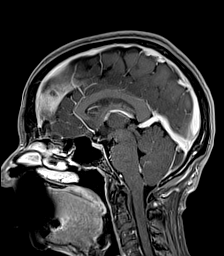
[im 96/144]
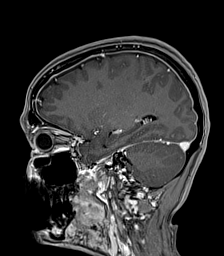
[im 120/144]
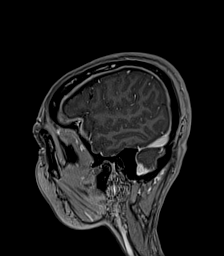
[im 144/144]
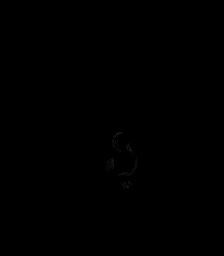

[Series 13: T1 post-contrast · coronal · 1.0mm · 0.94mm/px · 8 of 160 slices shown (2 of 5)]
[im 1/160]
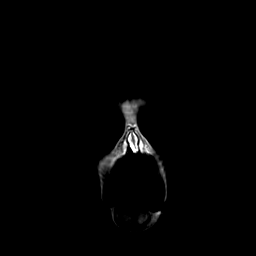
[im 23/160]
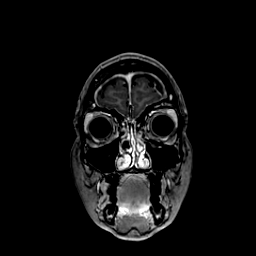
[im 46/160]
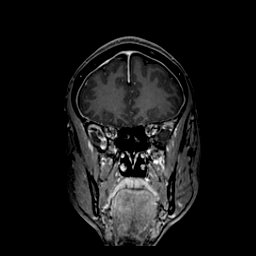
[im 69/160]
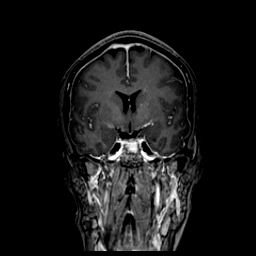
[im 91/160]
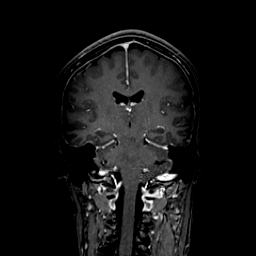
[im 114/160]
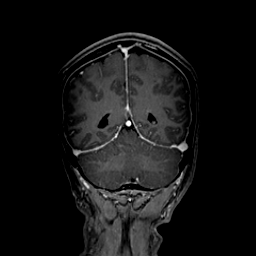
[im 137/160]
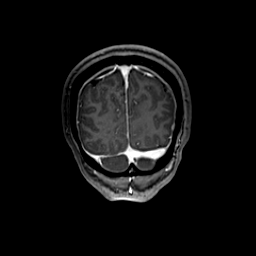
[im 160/160]
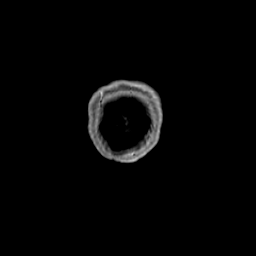

[Series 14: T1 post-contrast · axial · 1.0mm · 0.94mm/px · z∈[-74,+99]mm · 8 of 160 slices shown (3 of 5)]
[im 1/160]
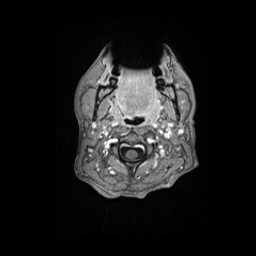
[im 23/160]
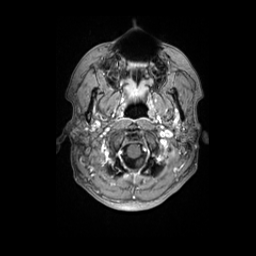
[im 46/160]
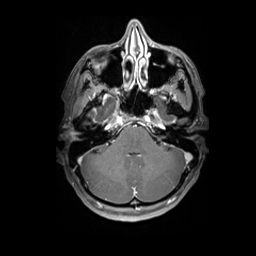
[im 69/160]
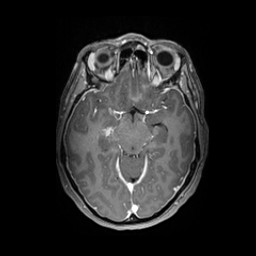
[im 91/160]
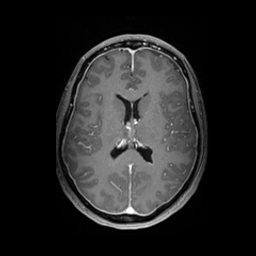
[im 114/160]
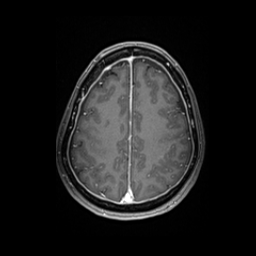
[im 137/160]
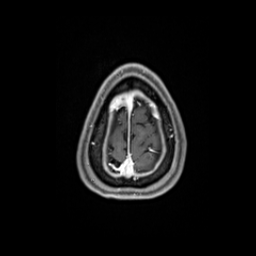
[im 160/160]
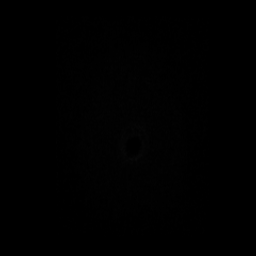

[Series 15: T1 post-contrast · axial · 1.0mm · 0.94mm/px · z∈[-61,+97]mm · 8 of 160 slices shown (4 of 5)]
[im 1/160]
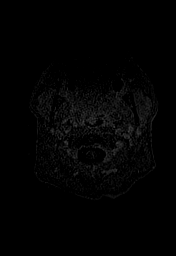
[im 23/160]
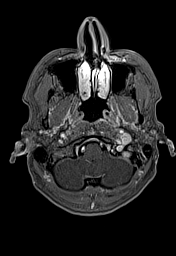
[im 46/160]
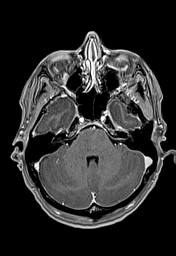
[im 69/160]
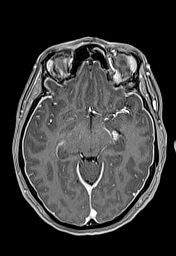
[im 91/160]
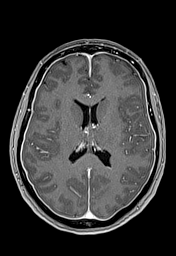
[im 114/160]
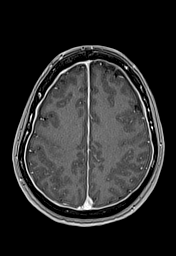
[im 137/160]
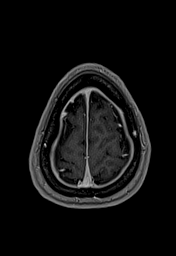
[im 160/160]
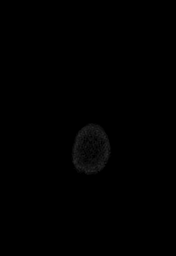

[Series 16: T1 post-contrast · coronal · 5.0mm · 0.43mm/px · 1 of 28 slices shown (5 of 5)]
[im 1/28]
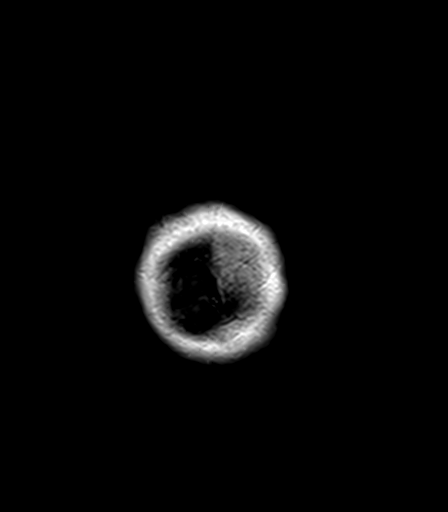

[38 of 48 positions shown; findings below may reference images not displayed]

FINDINGS: MRI HEAD WITHOUT CONTRAST

Brain: There is smooth dural thickening and enhancement with
associated small subdural hygromas overlying both cerebral
hemispheres, minimally increased in size compared to the study from
[DATE]. There is no significant mass effect on the underlying
brain parenchyma. There is no midline shift.

A few small scattered foci of nonspecific FLAIR signal abnormality
in the subcortical periventricular white matter are unchanged.

There is no abnormal mass lesion. There is no other abnormal
enhancement. There is no inferior cerebellar tonsillar descent.

Vascular: Normal flow voids.

Skull and upper cervical spine: Normal marrow signal.

Sinuses/Orbits: The paranasal sinuses are clear. The globes and
orbits are unremarkable.

Other: None.

MR VENOGRAM WITHOUT CONTRAST

On the time-of-flight images, there is diminished flow related
enhancement in the right transverse sinus, but the sinus enhances
normally on the postcontrast T1 images. There is also loss of flow
related signal in the posterior aspect of the superior sagittal
sinus there is no convincing evidence of dural venous sinus
thrombosis.
IMPRESSION: 1. Slight interval increase in size of bilateral subdural hygromas
compared to the study from [DATE] with associated smooth
pachymeningeal thickening and enhancement. Findings could again be
seen in the setting of intracranial hypotension. Meningitis is
considered less likely but not entirely excluded by imaging.
2. No condensing evidence of dural venous sinus thrombosis.

## 2021-08-23 MED ORDER — GADOBUTROL 1 MMOL/ML IV SOLN
6.0000 mL | Freq: Once | INTRAVENOUS | Status: AC | PRN
  Administered 2021-08-23: 6 mL via INTRAVENOUS

## 2021-08-23 NOTE — ED Notes (Signed)
Pt ambulatory in ED lobby. 

## 2021-08-23 NOTE — ED Triage Notes (Signed)
Patient states she has "long covid" and states she was seen on 08/05/21 with headaches, N/V and vision loss. Patient states symptoms returned.  Patient called her hematologist and was told they tried to schedule an urgent MRI with contrast, but could not be done until November. Patient states she was told to come to the ED for an MRI.  Patient states she has a known blood clot in her left jugular.

## 2021-08-23 NOTE — Telephone Encounter (Signed)
Pateint called stating she has a severe headache and is requesting MRI and appt with Dr.ennever or Sarah today. Called patient back and informed her Dr.Ennever is out and advised her to go to ED if she felt she needed to be seen ASAP. Patient states she is on her way to ED at [facility] now.

## 2021-08-23 NOTE — ED Provider Notes (Signed)
Emergency Medicine Provider Triage Evaluation Note  Sydney [name] , a 55 y.o. female  was evaluated in triage.  Pt complains of left-sided severe headache.  She reports associated blurred vision and left arm and leg numbness.  She does have a history of stroke in December of last year.  She states that these symptoms are similar to that prior episode.  She denies any weakness, loss of consciousness, trouble speaking, trouble walking.  She was sent by her primary care physician for further evaluation of her stroke and MRI.  Review of Systems  Positive:  Negative: See above   Physical Exam  BP (!) 148/77 (BP Location: Left Arm)   Pulse 61   Temp 97.9 F (36.6 C) (Oral)   Resp 18   Ht 5\' 2"  (1.575 m)   Wt 57.6 kg   SpO2 100%   BMI 23.23 kg/m  Gen:   Awake, no distress   Resp:  Normal effort  MSK:   Moves extremities without difficulty  Other:  Cranial nerves II through XII are intact.  5/5 strength in the upper and lower extremities.  Decreased sensation along the left side of the face, left arm, and left leg.  No dysdiadochokinesia on rapid altering movements.  No pronator drift.  Positive Romberg  Medical Decision Making  Medically screening exam initiated at 11:30 AM.  Appropriate orders placed.  [place] was informed that the remainder of the evaluation will be completed by another provider, this initial triage assessment does not replace that evaluation, and the importance of remaining in the ED until their evaluation is complete.     Myna Bright [place], PA-C 08/23/21 1133    Sherwood Gambler, MD 08/23/21 1200

## 2021-08-23 NOTE — Discharge Instructions (Signed)
As discussed, your evaluation today has been largely reassuring.  But, it is important that you monitor your condition carefully, and do not hesitate to return to the ED if you develop new, or concerning changes in your condition. ? ?Otherwise, please follow-up with your physician for appropriate ongoing care. ? ?

## 2021-08-23 NOTE — ED Provider Notes (Signed)
[place] DEPT Provider Note   CSN: [id number] Arrival date & time: 08/23/21  8469     History No chief complaint on file.   Sydney [name] is a 55 y.o. female.  HPI Patient with history of headaches, presumed COVID about a year ago, recent COVID 2 months ago, now presents with concern for headache, nausea, unsteadiness.  She has been having headaches for some time, today, or, had a more severe exacerbation, left-sided, severe, with transient difficulty seeing.  There is no syncope, fall, vomiting.  There is no clear precipitant.  She has been seen and evaluated by her neurologist multiple times recently, has been monitored for previously diagnosed dural vein thrombosis, is currently taking anticoagulant.  Currently patient is pain has improved markedly without intervention beyond rest, she has no vision complaints, no nausea.  She notes that after her diagnosis sometime ago she has been dealing with headaches, intermittently, without sustained relief in spite of using OTC medication.    Past Medical History:  Diagnosis Date   Anemia    Blood clots in brain    Headache    History of cold sores    HPV (human papilloma virus) infection    Post-operative nausea and vomiting     Patient Active Problem List   Diagnosis Date Noted   Hot flashes 03/24/2021   Elevated blood protein 03/24/2021   Chronic internal jugular vein thrombosis, left ([place]) 12/06/2020   Acute embolism and thrombosis of left internal jugular vein ([place]) 11/12/2020   Chronic anticoagulation 11/12/2020   Cerebral venous sinus thrombosis 10/28/2020   Iron deficiency anemia 09/10/2019   CKD (chronic kidney disease) stage 3, GFR 30-59 ml/min ([place]) 09/10/2019    Past Surgical History:  Procedure Laterality Date   BLEPHAROPLASTY  2005   CESAREAN SECTION  2002     OB History     Gravida  1   Para  1   Term      Preterm      AB      Living  1      SAB      IAB       Ectopic      Multiple      Live Births              Family History  Problem Relation Age of Onset   Lung cancer Maternal Grandmother    Lung cancer Maternal Grandfather    Colon cancer Neg Hx    Colon polyps Neg Hx    Esophageal cancer Neg Hx    Rectal cancer Neg Hx    Stomach cancer Neg Hx     Social History   Tobacco Use   Smoking status: Never   Smokeless tobacco: Never  Vaping Use   Vaping Use: Never used  Substance Use Topics   Alcohol use: Never   Drug use: Never    Home Medications Prior to Admission medications   Medication Sig Start Date End Date Taking? Authorizing Provider  acetaminophen (TYLENOL) 500 MG tablet Take 1,000 mg by mouth every 8 (eight) hours as needed for mild pain.   Yes [provider]  Biotin 5 MG TABS Take 5 mg by mouth at bedtime. Chewable   Yes [provider]  dabigatran (PRADAXA) 150 MG CAPS capsule Take 150 mg by mouth 2 (two) times daily.   Yes [provider]  Multiple Vitamin (MULTIVITAMIN) tablet Take 1 tablet by mouth daily.   Yes [provider]  topiramate (TOPAMAX) 25 MG tablet Take 1 tablet (25 mg total) by mouth 2 (two) times daily. 08/18/21  Yes Garvin Fila, MD  valACYclovir (VALTREX) 1000 MG tablet 2 tabs at onset, rpt dose once in 12 hours. Patient taking differently: Take 1,000 mg by mouth daily as needed (Cold Sores). 06/01/21  Yes Kuneff, Renee A, DO  aspirin EC 81 MG tablet Take 1 tablet (81 mg total) by mouth daily. Swallow whole. 08/18/21   Garvin Fila, MD  butalbital-acetaminophen-caffeine (FIORICET) [phone] MG tablet Take 1 tablet by mouth every 6 (six) hours as needed for headache. 08/18/21   Garvin Fila, MD    Allergies    Doxycycline  Review of Systems   Review of Systems  Constitutional:        Per HPI, otherwise negative  HENT:         Per HPI, otherwise negative  Eyes:  Positive for visual disturbance.  Respiratory:         Per HPI, otherwise  negative  Cardiovascular:        Per HPI, otherwise negative  Gastrointestinal:  Negative for vomiting.  Endocrine:       Negative aside from HPI  Genitourinary:        Neg aside from HPI   Musculoskeletal:        Per HPI, otherwise negative  Skin: Negative.   Neurological:  Positive for headaches. Negative for syncope.   Physical Exam Updated Vital Signs BP 139/81   Pulse (!) 49   Temp 97.9 F (36.6 C) (Oral)   Resp 15   Ht 5\' 2"  (1.575 m)   Wt 57.6 kg   SpO2 100%   BMI 23.23 kg/m   Physical Exam Vitals and nursing note reviewed.  Constitutional:      General: She is not in acute distress.    Appearance: She is well-developed.  HENT:     Head: Normocephalic and atraumatic.  Eyes:     Conjunctiva/sclera: Conjunctivae normal.  Cardiovascular:     Rate and Rhythm: Normal rate and regular rhythm.  Pulmonary:     Effort: Pulmonary effort is normal. No respiratory distress.     Breath sounds: Normal breath sounds. No stridor.  Abdominal:     General: There is no distension.  Skin:    General: Skin is warm and dry.  Neurological:     General: No focal deficit present.     Mental Status: She is alert and oriented to person, place, and time.     Cranial Nerves: No cranial nerve deficit.     Motor: No weakness.  Psychiatric:        Mood and Affect: Mood normal.        Behavior: Behavior normal.    ED Results / Procedures / Treatments   Labs (all labs ordered are listed, but only abnormal results are displayed) Labs Reviewed  PROTIME-INR - Abnormal; Notable for the following components:      Result Value   Prothrombin Time 18.6 (*)    INR 1.6 (*)    All other components within normal limits  APTT - Abnormal; Notable for the following components:   aPTT 66 (*)    All other components within normal limits  COMPREHENSIVE METABOLIC PANEL - Abnormal; Notable for the following components:   Creatinine, Ser 1.17 (*)    Total Protein 9.1 (*)    GFR, Estimated 56 (*)     All other components within normal  limits  I-STAT CHEM 8, ED - Abnormal; Notable for the following components:   Creatinine, Ser 1.10 (*)    All other components within normal limits  CBC  DIFFERENTIAL  CBG MONITORING, ED  I-STAT BETA HCG BLOOD, ED ([facility], [facility], [facility] ONLY)    EKG EKG Interpretation  Date/Time:  Tuesday August 23 2021 13:59:55 EDT Ventricular Rate:  48 PR Interval:  140 QRS Duration: 98 QT Interval:  462 QTC Calculation: 413 R Axis:   80 Text Interpretation: Sinus bradycardia Abnormal ECG Confirmed by Carmin Muskrat [phone]) on 08/23/2021 2:11:50 PM  Radiology MR Brain W and Wo Contrast  Result Date: 08/23/2021 CLINICAL DATA:  Left-sided headache, blurred vision, leg numbness EXAM: MRI HEAD WITHOUT CONTRAST MRV HEAD WITHOUT CONTRAST TECHNIQUE: Multiplanar, multi-echo pulse sequences of the brain and surrounding structures were acquired without intravenous contrast. Angiographic images of the intracranial venous structures were acquired using MRV technique without intravenous contrast. COMPARISON:  CT/CTA/CTV head, and MR head 08/05/2021, MRI/MRV head 06/01/2021 FINDINGS: MRI HEAD WITHOUT CONTRAST Brain: There is smooth dural thickening and enhancement with associated small subdural hygromas overlying both cerebral hemispheres, minimally increased in size compared to the study from 08/05/2021. There is no significant mass effect on the underlying brain parenchyma. There is no midline shift. A few small scattered foci of nonspecific FLAIR signal abnormality in the subcortical periventricular white matter are unchanged. There is no abnormal mass lesion. There is no other abnormal enhancement. There is no inferior cerebellar tonsillar descent. Vascular: Normal flow voids. Skull and upper cervical spine: Normal marrow signal. Sinuses/Orbits: The paranasal sinuses are clear. The globes and orbits are unremarkable. Other: None. MR VENOGRAM WITHOUT CONTRAST On the time-of-flight  images, there is diminished flow related enhancement in the right transverse sinus, but the sinus enhances normally on the postcontrast T1 images. There is also loss of flow related signal in the posterior aspect of the superior sagittal sinus there is no convincing evidence of dural venous sinus thrombosis. IMPRESSION: 1. Slight interval increase in size of bilateral subdural hygromas compared to the study from 08/05/2021 with associated smooth pachymeningeal thickening and enhancement. Findings could again be seen in the setting of intracranial hypotension. Meningitis is considered less likely but not entirely excluded by imaging. 2. No condensing evidence of dural venous sinus thrombosis. Electronically Signed   By: Valetta Mole M.D.   On: 08/23/2021 13:39   MR MRV HEAD W WO CONTRAST  Result Date: 08/23/2021 CLINICAL DATA:  Left-sided headache, blurred vision, leg numbness EXAM: MRI HEAD WITHOUT CONTRAST MRV HEAD WITHOUT CONTRAST TECHNIQUE: Multiplanar, multi-echo pulse sequences of the brain and surrounding structures were acquired without intravenous contrast. Angiographic images of the intracranial venous structures were acquired using MRV technique without intravenous contrast. COMPARISON:  CT/CTA/CTV head, and MR head 08/05/2021, MRI/MRV head 06/01/2021 FINDINGS: MRI HEAD WITHOUT CONTRAST Brain: There is smooth dural thickening and enhancement with associated small subdural hygromas overlying both cerebral hemispheres, minimally increased in size compared to the study from 08/05/2021. There is no significant mass effect on the underlying brain parenchyma. There is no midline shift. A few small scattered foci of nonspecific FLAIR signal abnormality in the subcortical periventricular white matter are unchanged. There is no abnormal mass lesion. There is no other abnormal enhancement. There is no inferior cerebellar tonsillar descent. Vascular: Normal flow voids. Skull and upper cervical spine: Normal  marrow signal. Sinuses/Orbits: The paranasal sinuses are clear. The globes and orbits are unremarkable. Other: None. MR VENOGRAM WITHOUT CONTRAST On the time-of-flight images,  there is diminished flow related enhancement in the right transverse sinus, but the sinus enhances normally on the postcontrast T1 images. There is also loss of flow related signal in the posterior aspect of the superior sagittal sinus there is no convincing evidence of dural venous sinus thrombosis. IMPRESSION: 1. Slight interval increase in size of bilateral subdural hygromas compared to the study from 08/05/2021 with associated smooth pachymeningeal thickening and enhancement. Findings could again be seen in the setting of intracranial hypotension. Meningitis is considered less likely but not entirely excluded by imaging. 2. No condensing evidence of dural venous sinus thrombosis. Electronically Signed   By: Valetta Mole M.D.   On: 08/23/2021 13:39    Procedures Procedures   Medications Ordered in ED Medications  gadobutrol (GADAVIST) 1 MMOL/ML injection 6 mL (6 mLs Intravenous Contrast Given 08/23/21 1314)    ED Course  I have reviewed the triage vital signs and the nursing notes.  Pertinent labs & imaging results that were available during my care of the patient were reviewed by me and considered in my medical decision making (see chart for details).  After the initial evaluation, and my review of her MR the results, no evidence for persistent venous thrombosis, I discussed indications for therapy, including fluids, Decadron, Compazine, Toradol, ongoing monitoring given her history.  Patient declined offer for additional interventions, voiced satisfaction and knowing that there was no new evidence for worsening/progression of her dural sinus thrombosis.  We discussed the importance of following up with her primary care physician and her neurologist, she is amenable to both, has scheduled follow-up in 2 days with the  former. Absent ongoing complaints, with generally reassuring MRI, labs, patient discharged, stable condition. MDM Rules/Calculators/A&P MDM Number of Diagnoses or Management Options Bad headache: established, worsening   Amount and/or Complexity of Data Reviewed Clinical lab tests: ordered and reviewed Tests in the radiology section of CPT: ordered and reviewed Tests in the medicine section of CPT: reviewed and ordered Decide to obtain previous medical records or to obtain history from someone other than the patient: yes Review and summarize past medical records: yes Independent visualization of images, tracings, or specimens: yes  Risk of Complications, Morbidity, and/or Mortality Presenting problems: high Diagnostic procedures: high Management options: high  Critical Care Total time providing critical care: < 30 minutes  Patient Progress Patient progress: improved   Final Clinical Impression(s) / ED Diagnoses Final diagnoses:  Bad headache     Carmin Muskrat, MD 08/23/21 1517

## 2021-08-23 NOTE — ED Notes (Signed)
Off floor to MRI

## 2021-08-25 ENCOUNTER — Other Ambulatory Visit: Payer: Self-pay

## 2021-08-25 ENCOUNTER — Encounter: Payer: Self-pay | Admitting: Family Medicine

## 2021-08-25 ENCOUNTER — Ambulatory Visit (INDEPENDENT_AMBULATORY_CARE_PROVIDER_SITE_OTHER): Payer: No Typology Code available for payment source | Admitting: Family Medicine

## 2021-08-25 VITALS — BP 110/71 | HR 72 | Temp 98.1°F | Ht 62.0 in | Wt 122.0 lb

## 2021-08-25 DIAGNOSIS — G08 Intracranial and intraspinal phlebitis and thrombophlebitis: Secondary | ICD-10-CM | POA: Diagnosis not present

## 2021-08-25 DIAGNOSIS — I82C22 Chronic embolism and thrombosis of left internal jugular vein: Secondary | ICD-10-CM

## 2021-08-25 DIAGNOSIS — R519 Headache, unspecified: Secondary | ICD-10-CM | POA: Diagnosis not present

## 2021-08-25 MED ORDER — ONDANSETRON HCL 4 MG PO TABS: 4.0000 mg | ORAL_TABLET | Freq: Three times a day (TID) | ORAL | 5 refills | Status: DC | PRN

## 2021-08-25 MED ORDER — PREDNISONE 20 MG PO TABS: ORAL_TABLET | ORAL | 0 refills | Status: DC

## 2021-08-25 MED ORDER — NORTRIPTYLINE HCL 10 MG PO CAPS: 10.0000 mg | ORAL_CAPSULE | Freq: Every day | ORAL | 5 refills | Status: DC

## 2021-08-25 MED ORDER — HYDROXYZINE HCL 10 MG PO TABS: 5.0000 mg | ORAL_TABLET | Freq: Every day | ORAL | 5 refills | Status: DC | PRN

## 2021-08-25 NOTE — Patient Instructions (Signed)
   When you go home today take 1/2 tab of vistaril. The two shots today and this medicine is a "migraine cocktail".  Start prednisone tomorrow. Steroid shot today.  Prescribed zofran and vistaril for you to use for nausea and headache.   Pamelor 10 mg about 1 hour before bed nightly. After a week, if need extra control you can increase to 20 mg before bed. This med helps with preventing headaches.

## 2021-08-25 NOTE — Progress Notes (Signed)
This visit occurred during the SARS-CoV-2 public health emergency.  Safety protocols were in place, including screening questions prior to the visit, additional usage of staff PPE, and extensive cleaning of exam room while observing appropriate contact time as indicated for disinfecting solutions.    [place] , [birth year], 55 y.o., female MRN: [id number] Patient Care Team    Relationship Specialty Notifications Start End  [name], DO PCP - General Family Medicine  12/03/18   [name], MD Consulting Physician Obstetrics and Gynecology  12/03/18     Chief Complaint  Patient presents with   Headache    ED f/u; pt has questions about long COVID     Subjective: Pt presents for an OV to follow up on ED visit for headache and blurred vision.  Patient reports she continues to have headache that starts along the back of her head along the neckline and radiates forward up above her ears.  She reports sometimes this will come with an electric shocklike feeling.  She has had this headache intermittently throughout October since she had COVID-19 back in September 2022.  He has been seen at the emergency room on 2 occasions one for dizziness and visual changes in the other for visual changes and headache most recently on October 25 she is no longer taking the Topamax, she states it was not working.  She has stayed on the Pradaxa because she is fearful of another thrombus, since her initial thrombosis occurred when she had COVID or COVID-vaccine last year. She reports up until she had COVID again in September she was doing well and getting close to back to her normal life.  She has not been tried on any other medications than Topamax for chronic tension/migraine headache.  Reviewed labs, CTs, MRIs from ED visits on October 7 and October 25.  None of which show any acute cause for her continued headaches.  Depression screen [facility] 2/9 08/25/2021 08/27/2020 08/20/2019 12/03/2018   Decreased Interest 0 0 0 0  Down, Depressed, Hopeless 0 0 0 0  PHQ - 2 Score 0 0 0 0    Allergies  Allergen Reactions   Doxycycline Nausea And Vomiting   Social History   Social History Narrative   Lives with son   Right Handed   Drinks no caffeine   Past Medical History:  Diagnosis Date   Anemia    Blood clots in brain    COVID    Headache    History of cold sores    HPV (human papilloma virus) infection    Post-operative nausea and vomiting    Past Surgical History:  Procedure Laterality Date   BLEPHAROPLASTY  2005   CESAREAN SECTION  2002   Family History  Problem Relation Age of Onset   Lung cancer Maternal Grandmother    Lung cancer Maternal Grandfather    Colon cancer Neg Hx    Colon polyps Neg Hx    Esophageal cancer Neg Hx    Rectal cancer Neg Hx    Stomach cancer Neg Hx    Allergies as of 08/25/2021       Reactions   Doxycycline Nausea And Vomiting        Medication List        Accurate as of August 25, 2021  1:45 PM. If you have any questions, ask your nurse or doctor.          STOP taking these medications    aspirin  EC 81 MG tablet Stopped by: Howard Pouch, DO   atorvastatin 40 MG tablet Commonly known as: LIPITOR Stopped by: Howard Pouch, DO   topiramate 25 MG tablet Commonly known as: TOPAMAX Stopped by: Howard Pouch, DO       TAKE these medications    acetaminophen 500 MG tablet Commonly known as: TYLENOL Take 1,000 mg by mouth every 8 (eight) hours as needed for mild pain.   Biotin 5 MG Tabs Take 5 mg by mouth at bedtime. Chewable   butalbital-acetaminophen-caffeine 50-325-40 MG tablet Commonly known as: FIORICET Take 1 tablet by mouth every 6 (six) hours as needed for headache.   dabigatran 150 MG Caps capsule Commonly known as: PRADAXA Take 150 mg by mouth 2 (two) times daily.   hydrOXYzine 10 MG tablet Commonly known as: ATARAX/VISTARIL Take 0.5-1 tablets (5-10 mg total) by mouth daily as  needed. Started by: Howard Pouch, DO   multivitamin tablet Take 1 tablet by mouth daily.   nortriptyline 10 MG capsule Commonly known as: Pamelor Take 1-2 capsules (10-20 mg total) by mouth at bedtime. Started by: Howard Pouch, DO   ondansetron 4 MG tablet Commonly known as: ZOFRAN Take 1 tablet (4 mg total) by mouth every 8 (eight) hours as needed for nausea or vomiting. Started by: Howard Pouch, DO   predniSONE 20 MG tablet Commonly known as: DELTASONE 60 mg x3d, 40 mg x3d, 20 mg x2d, 10 mg x2d Started by: Howard Pouch, DO   valACYclovir 500 MG tablet Commonly known as: VALTREX Take 500 mg by mouth 2 (two) times daily. What changed: Another medication with the same name was removed. Continue taking this medication, and follow the directions you see here. Changed by: Howard Pouch, DO        All past medical history, surgical history, allergies, family history, immunizations andmedications were updated in the EMR today and reviewed under the history and medication portions of their EMR.     ROS: Negative, with the exception of above mentioned in HPI   Objective:  BP 110/71   Pulse 72   Temp 98.1 F (36.7 C) (Oral)   Ht 5\' 2"  (1.575 m)   Wt 122 lb (55.3 kg)   SpO2 100%   BMI 22.31 kg/m  Body mass index is 22.31 kg/m. Gen: Afebrile. No acute distress. Nontoxic in appearance, well developed, well nourished.  HENT: AT. Exline.  Eyes:Pupils Equal Round Reactive to light, Extraocular movements intact,  Conjunctiva without redness, discharge or icterus. CV: RRR  Chest: CTAB, no wheeze or crackles. Good air movement, normal resp effort.  Skin: no rashes, purpura or petechiae.  Neuro: Normal gait. PERLA. EOMi. Alert. Oriented x3  Psych: Normal affect, dress and demeanor. Normal speech. Normal thought content and judgment.  No results found. No results found. No results found for this or any previous visit (from the past 24 hour(s)).  Assessment/Plan: Tearah Saulsbury is a 55 y.o. female present for OV for  Nonintractable headache, unspecified chronicity pattern, unspecified headache type Uncertain etiology of her recurrent headaches.  Possibly migraine headaches versus tension headaches status post COVID/thrombus. Neurology had tried Topamax which was not helpful for her.  She has not been on this medication for couple weeks. Provided her with IM Zofran injection along with Depo-Medrol 80 today. Prescribed Vistaril 5-10 mg, to be taken as soon as she gets home.  She was also encouraged to take 5-10 mg of Vistaril and lay down if she feels a headache coming on. Nortriptyline  10 mg nightly, can taper to 20 mg in 1 week if needed. Prednisone taper prescribed today. Zofran p.o. as needed for nausea prescribed today. Encouraged her to follow-up with her hematology team and her ophthalmologist which she has an appointment today. Follow-up in 4 weeks here can be her CPE with follow-up on current condition  Cerebral venous sinus thrombosis/Chronic internal jugular vein thrombosis, left [facility]) He has an appointment with hematology next week.  I encouraged her to continue the Pradaxa twice daily for now.  I can understand why she is hesitant to stop the Pradaxa and start baby aspirin, when she is in the middle of having headaches after just having COVID which is similar to the initiating circumstances of her thrombus. She will discuss in more detail with her hematology team.  Reviewed expectations re: course of current medical issues. Discussed self-management of symptoms. Outlined signs and symptoms indicating need for more acute intervention. Patient verbalized understanding and all questions were answered. Patient received an After-Visit Summary.    No orders of the defined types were placed in this encounter.  Meds ordered this encounter  Medications   nortriptyline (PAMELOR) 10 MG capsule    Sig: Take 1-2 capsules (10-20 mg total) by mouth at  bedtime.    Dispense:  60 capsule    Refill:  5   ondansetron (ZOFRAN) 4 MG tablet    Sig: Take 1 tablet (4 mg total) by mouth every 8 (eight) hours as needed for nausea or vomiting.    Dispense:  20 tablet    Refill:  5   predniSONE (DELTASONE) 20 MG tablet    Sig: 60 mg x3d, 40 mg x3d, 20 mg x2d, 10 mg x2d    Dispense:  18 tablet    Refill:  0   hydrOXYzine (ATARAX/VISTARIL) 10 MG tablet    Sig: Take 0.5-1 tablets (5-10 mg total) by mouth daily as needed.    Dispense:  30 tablet    Refill:  5    Referral Orders  No referral(s) requested today    > 45 minutes was dedicated to this patient's encounter to include review of 2 ED visits, CTs, labs, MRIs and neurological outpatient visit notes, placing orders, face-to-face time with patient and discussing different possible treatment plans for her condition.   Note is dictated utilizing voice recognition software. Although note has been proof read prior to signing, occasional typographical errors still can be missed. If any questions arise, please do not hesitate to call for verification.   electronically signed by:  Howard Pouch, DO  [place]

## 2021-08-26 MED ORDER — ONDANSETRON HCL 4 MG/2ML IJ SOLN
4.0000 mg | Freq: Once | INTRAMUSCULAR | Status: AC
  Administered 2021-08-25: 4 mg via INTRAMUSCULAR

## 2021-08-26 MED ORDER — METHYLPREDNISOLONE ACETATE 80 MG/ML IJ SUSP
80.0000 mg | Freq: Once | INTRAMUSCULAR | Status: AC
  Administered 2021-08-25: 80 mg via INTRAMUSCULAR

## 2021-08-26 NOTE — Addendum Note (Signed)
Addended by: Kavin Leech on: 08/26/2021 10:53 AM   Modules accepted: Orders

## 2021-08-29 ENCOUNTER — Encounter: Payer: Self-pay | Admitting: Family

## 2021-08-29 ENCOUNTER — Inpatient Hospital Stay (HOSPITAL_BASED_OUTPATIENT_CLINIC_OR_DEPARTMENT_OTHER): Payer: No Typology Code available for payment source | Admitting: Family

## 2021-08-29 ENCOUNTER — Telehealth: Payer: Self-pay

## 2021-08-29 ENCOUNTER — Other Ambulatory Visit: Payer: Self-pay

## 2021-08-29 ENCOUNTER — Encounter: Payer: Self-pay | Admitting: *Deleted

## 2021-08-29 ENCOUNTER — Inpatient Hospital Stay: Payer: No Typology Code available for payment source | Attending: Family

## 2021-08-29 VITALS — BP 124/72 | HR 57 | Temp 98.6°F | Resp 16 | Wt 126.0 lb

## 2021-08-29 DIAGNOSIS — I82C12 Acute embolism and thrombosis of left internal jugular vein: Secondary | ICD-10-CM

## 2021-08-29 DIAGNOSIS — E039 Hypothyroidism, unspecified: Secondary | ICD-10-CM

## 2021-08-29 DIAGNOSIS — R112 Nausea with vomiting, unspecified: Secondary | ICD-10-CM | POA: Diagnosis not present

## 2021-08-29 DIAGNOSIS — I82C22 Chronic embolism and thrombosis of left internal jugular vein: Secondary | ICD-10-CM

## 2021-08-29 DIAGNOSIS — R0602 Shortness of breath: Secondary | ICD-10-CM | POA: Diagnosis not present

## 2021-08-29 DIAGNOSIS — Z7901 Long term (current) use of anticoagulants: Secondary | ICD-10-CM | POA: Insufficient documentation

## 2021-08-29 DIAGNOSIS — D509 Iron deficiency anemia, unspecified: Secondary | ICD-10-CM | POA: Diagnosis not present

## 2021-08-29 DIAGNOSIS — R55 Syncope and collapse: Secondary | ICD-10-CM | POA: Diagnosis not present

## 2021-08-29 DIAGNOSIS — Z79899 Other long term (current) drug therapy: Secondary | ICD-10-CM | POA: Diagnosis not present

## 2021-08-29 DIAGNOSIS — R519 Headache, unspecified: Secondary | ICD-10-CM | POA: Insufficient documentation

## 2021-08-29 DIAGNOSIS — D6859 Other primary thrombophilia: Secondary | ICD-10-CM

## 2021-08-29 DIAGNOSIS — G08 Intracranial and intraspinal phlebitis and thrombophlebitis: Secondary | ICD-10-CM

## 2021-08-29 LAB — CBC WITH DIFFERENTIAL (CANCER CENTER ONLY)
Abs Immature Granulocytes: 0.06 K/uL (ref 0.00–0.07)
Basophils Absolute: 0 K/uL (ref 0.0–0.1)
Basophils Relative: 0 %
Eosinophils Absolute: 0 K/uL (ref 0.0–0.5)
Eosinophils Relative: 0 %
HCT: 36.5 % (ref 36.0–46.0)
Hemoglobin: 12.3 g/dL (ref 12.0–15.0)
Immature Granulocytes: 1 %
Lymphocytes Relative: 9 %
Lymphs Abs: 0.9 K/uL (ref 0.7–4.0)
MCH: 30.4 pg (ref 26.0–34.0)
MCHC: 33.7 g/dL (ref 30.0–36.0)
MCV: 90.3 fL (ref 80.0–100.0)
Monocytes Absolute: 0.3 K/uL (ref 0.1–1.0)
Monocytes Relative: 3 %
Neutro Abs: 8.9 K/uL — ABNORMAL HIGH (ref 1.7–7.7)
Neutrophils Relative %: 87 %
Platelet Count: 209 K/uL (ref 150–400)
RBC: 4.04 MIL/uL (ref 3.87–5.11)
RDW: 13.6 % (ref 11.5–15.5)
WBC Count: 10.2 K/uL (ref 4.0–10.5)
nRBC: 0 % (ref 0.0–0.2)

## 2021-08-29 LAB — CMP (CANCER CENTER ONLY)
ALT: 9 U/L (ref 0–44)
AST: 14 U/L — ABNORMAL LOW (ref 15–41)
Albumin: 4.3 g/dL (ref 3.5–5.0)
Alkaline Phosphatase: 53 U/L (ref 38–126)
Anion gap: 7 (ref 5–15)
BUN: 20 mg/dL (ref 6–20)
CO2: 27 mmol/L (ref 22–32)
Calcium: 10.1 mg/dL (ref 8.9–10.3)
Chloride: 104 mmol/L (ref 98–111)
Creatinine: 1.06 mg/dL — ABNORMAL HIGH (ref 0.44–1.00)
GFR, Estimated: >60 mL/min
Glucose, Bld: 103 mg/dL — ABNORMAL HIGH (ref 70–99)
Potassium: 3.7 mmol/L (ref 3.5–5.1)
Sodium: 138 mmol/L (ref 135–145)
Total Bilirubin: 0.4 mg/dL (ref 0.3–1.2)
Total Protein: 8.2 g/dL — ABNORMAL HIGH (ref 6.5–8.1)

## 2021-08-29 LAB — LACTATE DEHYDROGENASE: LDH: 101 U/L (ref 98–192)

## 2021-08-29 NOTE — Progress Notes (Signed)
Hematology and Oncology Follow Up Visit  Sydney [name] [id number] [birth year] 55 y.o. 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 She is not doing well. She is in a wheelchair at this time feeling quite weak and fatigued. She states that she had Covid in September and then after receiving her flu shot in October she relapsed with her symptoms.  She states that her frontal headache is constant and she feel that her head "is going to explode". Neurology did not feel that anything on recent scans explained her symptoms.  MRI of brain on 08/23/2021 showed slight interval increase in size of bilateral subdural hygromas with associated smooth pachymeningeal thickening and enhancement and intracranial hypotension was mentioned as a possible cause.   She is having episodes of dizziness and near syncope. She is ambulatory at home but has to be careful.  Her ears are ringing. She is having episodes of hypertension followed by hypotension. She is experiencing light sensitivity.  She has mild SOB with exertion.  She feels like the left side of her body is heavy. She has no facial deficit. No problems swallowing.  She has nausea and vomiting at times.  No fever, chills, cough, rash, chest pain, abdominal pain or changes in bowel or bladder habits.  She states that she has problems relaxing to go to the bathroom due to her headaches.  No swelling or tenderness in  her extremities.  Appetite comes and goes and she is doing her best to hydrated. Her weight is 126 lbs.   ECOG Performance Status: 1 - Symptomatic but completely ambulatory  Medications:  Allergies as of 08/29/2021       Reactions   Doxycycline Nausea And Vomiting        Medication List        Accurate as of August 29, 2021   3:04 PM. If you have any questions, ask your nurse or doctor.          acetaminophen 500 MG tablet Commonly known as: TYLENOL Take 1,000 mg by mouth every 8 (eight) hours as needed for mild pain.   Biotin 5 MG Tabs Take 5 mg by mouth at bedtime. Chewable   butalbital-acetaminophen-caffeine 50-325-40 MG tablet Commonly known as: FIORICET Take 1 tablet by mouth every 6 (six) hours as needed for headache.   dabigatran 150 MG Caps capsule Commonly known as: PRADAXA Take 150 mg by mouth 2 (two) times daily.   hydrOXYzine 10 MG tablet Commonly known as: ATARAX/VISTARIL Take 0.5-1 tablets (5-10 mg total) by mouth daily as needed.   multivitamin tablet Take 1 tablet by mouth daily.   nortriptyline 10 MG capsule Commonly known as: Pamelor Take 1-2 capsules (10-20 mg total) by mouth at bedtime.   ondansetron 4 MG tablet Commonly known as: ZOFRAN Take 1 tablet (4 mg total) by mouth every 8 (eight) hours as needed for nausea or vomiting.   predniSONE 20 MG tablet Commonly known as: DELTASONE 60 mg x3d, 40 mg x3d, 20 mg x2d, 10 mg x2d   valACYclovir 500 MG tablet Commonly known as: VALTREX Take 500 mg by mouth 2 (two) times daily.        Allergies:  Allergies  Allergen Reactions   Doxycycline Nausea And Vomiting    Past Medical  History, Surgical history, Social history, and Family History were reviewed and updated.  Review of Systems: All other 10 point review of systems is negative.   Physical Exam:  vitals were not taken for this visit.   Wt Readings from Last 3 Encounters:  08/25/21 122 lb (55.3 kg)  08/23/21 127 lb (57.6 kg)  08/18/21 127 lb (57.6 kg)    Ocular: Sclerae unicteric, pupils equal, round and reactive to light Ear-nose-throat: Oropharynx clear, dentition fair Lymphatic: No cervical or supraclavicular adenopathy Lungs no rales or rhonchi, good excursion bilaterally Heart regular rate and rhythm, no murmur appreciated Abd soft, nontender,  positive bowel sounds MSK no focal spinal tenderness, no joint edema Neuro: non-focal, well-oriented, appropriate affect Breasts: Deferred   Lab Results  Component Value Date   WBC 10.2 08/29/2021   HGB 12.3 08/29/2021   HCT 36.5 08/29/2021   MCV 90.3 08/29/2021   PLT 209 08/29/2021   Lab Results  Component Value Date   FERRITIN 13 10/31/2020   IRON 23 (L) 10/31/2020   TIBC 375 10/31/2020   UIBC 352 10/31/2020   IRONPCTSAT 6 (L) 10/31/2020   Lab Results  Component Value Date   RETICCTPCT 1.7 10/31/2020   RBC 4.04 08/29/2021   No results found for: KPAFRELGTCHN, LAMBDASER, KAPLAMBRATIO No results found for: Kandis Cocking, IGMSERUM Lab Results  Component Value Date   ALBUMINELP 3.8 09/28/2020   A1GS 0.5 (H) 09/28/2020   A2GS 0.9 09/28/2020   BETS 0.6 09/28/2020   BETA2SER 0.4 09/28/2020   GAMS 1.7 09/28/2020   SPEI  09/28/2020     Comment:     . Alpha-1 globulin increase noted. .      Chemistry      Component Value Date/Time   NA 138 08/29/2021 1356   K 3.7 08/29/2021 1356   CL 104 08/29/2021 1356   CO2 27 08/29/2021 1356   BUN 20 08/29/2021 1356   CREATININE 1.06 (H) 08/29/2021 1356   CREATININE 1.29 (H) 11/12/2020 1557      Component Value Date/Time   CALCIUM 10.1 08/29/2021 1356   ALKPHOS 53 08/29/2021 1356   AST 14 (L) 08/29/2021 1356   ALT 9 08/29/2021 1356   BILITOT 0.4 08/29/2021 1356       Impression and Plan: Sydney [name] is a very pleasant 55 yo caucasian female with diagnosis of nonocclusive thrombus within the mid superior sagittal sinus extending posteriorly, left sigmoid sinus and visualized left internal jugular vein. Repeat MRI showed resolution of thrombi.  I am shocked by how she has deteriorated since our last visit. She is in a wheelchair today and we had to dim the room lighting due to her severe headache and light sensitivity.  I called and spoke with her PCP Dr. Raoul Pitch. She has adjusted her migraine medication and will place a  referral for a neurology second opinion.  Her iron studies are low so we have submitted a request for Monoferric and are waiting on insurance approval.  TSH and T4 were unremarkable.  With her above symptoms including near syncope and palpitations we will also get an ECHO this week.  Follow-up in 6 weeks.  She can contact our office with any questions or concerns.   Lottie Dawson, NP 10/31/20223:04 PM

## 2021-08-29 NOTE — Telephone Encounter (Signed)
Pt called stating that the HA and n/v has worsen since Friday. Pt has been taking meds prescribed. Pt states she is still taking 10 mg of Pamelor at the moment. Please advise.

## 2021-08-29 NOTE — Telephone Encounter (Signed)
She can attempt to increase her Pamelor to 20 mg tonight.  If headache is worsening she would need to return to the emergency room and they will need to consider other etiologies including a spinal tap for evaluation. Only other option is to refer to neurology for second opinion.  However this usually takes some time to get in and would likely not benefit her acutely.

## 2021-08-30 ENCOUNTER — Other Ambulatory Visit: Payer: Self-pay | Admitting: Family

## 2021-08-30 ENCOUNTER — Encounter: Payer: No Typology Code available for payment source | Admitting: Family Medicine

## 2021-08-30 ENCOUNTER — Telehealth: Payer: Self-pay | Admitting: *Deleted

## 2021-08-30 DIAGNOSIS — R55 Syncope and collapse: Secondary | ICD-10-CM

## 2021-08-30 DIAGNOSIS — R002 Palpitations: Secondary | ICD-10-CM

## 2021-08-30 DIAGNOSIS — I82C12 Acute embolism and thrombosis of left internal jugular vein: Secondary | ICD-10-CM

## 2021-08-30 LAB — FERRITIN: Ferritin: 24 ng/mL (ref 11–307)

## 2021-08-30 LAB — T4: T4, Total: 9.3 ug/dL (ref 4.5–12.0)

## 2021-08-30 LAB — IRON AND TIBC
Iron: 35 ug/dL — ABNORMAL LOW (ref 41–142)
Saturation Ratios: 11 % — ABNORMAL LOW (ref 21–57)
TIBC: 325 ug/dL (ref 236–444)
UIBC: 290 ug/dL (ref 120–384)

## 2021-08-30 LAB — TSH: TSH: 0.926 u[IU]/mL (ref 0.308–3.960)

## 2021-08-30 LAB — PROTEIN S, TOTAL: Protein S Ag, Total: 88 % (ref 60–150)

## 2021-08-30 LAB — PROTEIN S ACTIVITY: Protein S Activity: 155 % — ABNORMAL HIGH (ref 63–140)

## 2021-08-30 NOTE — Telephone Encounter (Signed)
NP Shawn Route is old last name) calling from [facility] in [facility], she want to discuss info with Dr Raoul Pitch regarding the patients headaches and asked for a call back at [phone], She said the front desk can transfer her back since she is not always at her desk.

## 2021-08-30 NOTE — Telephone Encounter (Signed)
Attempted to call Lottie Dawson, NP.  She was not available and was out for lunch.  If she returns call today while I am still seeing patients, I will step out briefly to discuss case with her.

## 2021-08-30 NOTE — Telephone Encounter (Signed)
Spoke with pt who stated that she did increase her meds last night.   Please read message below.

## 2021-08-30 NOTE — Telephone Encounter (Signed)
No 08/29/21 LOS

## 2021-08-30 NOTE — Telephone Encounter (Signed)
FYI

## 2021-08-30 NOTE — Telephone Encounter (Signed)
She called back and I transferred to Dr Raoul Pitch

## 2021-08-31 ENCOUNTER — Telehealth: Payer: Self-pay | Admitting: Family Medicine

## 2021-08-31 DIAGNOSIS — G08 Intracranial and intraspinal phlebitis and thrombophlebitis: Secondary | ICD-10-CM

## 2021-08-31 DIAGNOSIS — I82C22 Chronic embolism and thrombosis of left internal jugular vein: Secondary | ICD-10-CM

## 2021-08-31 DIAGNOSIS — R519 Headache, unspecified: Secondary | ICD-10-CM

## 2021-08-31 DIAGNOSIS — R42 Dizziness and giddiness: Secondary | ICD-10-CM

## 2021-08-31 MED ORDER — CAFFEINE 100 MG PO TABS: 200.0000 mg | ORAL_TABLET | Freq: Two times a day (BID) | ORAL | 0 refills | Status: DC

## 2021-08-31 NOTE — Telephone Encounter (Signed)
Spoke with pt regarding provider's instructions. Pt will CB to sched appt

## 2021-08-31 NOTE — Telephone Encounter (Signed)
Please inform pt  - I have placed an urgent referral to [facility]  in [facility] for evaluation of her headaches and dizziness.   I have also called in caffeine tablets for her take for 2-3 days to see if that helps with her headache.   I would like to see her back next week to see how she is feeling as well. Can be virtual if she does not feel up to coming in.

## 2021-09-01 ENCOUNTER — Other Ambulatory Visit: Payer: Self-pay | Admitting: Family

## 2021-09-02 ENCOUNTER — Encounter: Payer: Self-pay | Admitting: Family Medicine

## 2021-09-02 ENCOUNTER — Encounter: Payer: Self-pay | Admitting: *Deleted

## 2021-09-02 ENCOUNTER — Encounter: Payer: Self-pay | Admitting: Family

## 2021-09-02 ENCOUNTER — Telehealth: Payer: Self-pay | Admitting: *Deleted

## 2021-09-02 ENCOUNTER — Telehealth (INDEPENDENT_AMBULATORY_CARE_PROVIDER_SITE_OTHER): Payer: No Typology Code available for payment source | Admitting: Family Medicine

## 2021-09-02 DIAGNOSIS — R519 Headache, unspecified: Secondary | ICD-10-CM | POA: Diagnosis not present

## 2021-09-02 DIAGNOSIS — G96810 Intracranial hypotension, unspecified: Secondary | ICD-10-CM

## 2021-09-02 DIAGNOSIS — G08 Intracranial and intraspinal phlebitis and thrombophlebitis: Secondary | ICD-10-CM

## 2021-09-02 MED ORDER — PREDNISONE 20 MG PO TABS: ORAL_TABLET | ORAL | 0 refills | Status: DC

## 2021-09-02 NOTE — Progress Notes (Signed)
This visit occurred during the SARS-CoV-2 public health emergency.  Safety protocols were in place, including screening questions prior to the visit, additional usage of staff PPE, and extensive cleaning of exam room while observing appropriate contact time as indicated for disinfecting solutions.    Sydney [name] , November 22, 1965, 55 y.o., female MRN: [id number] Patient Care Team    Relationship Specialty Notifications Start End  Sydney Hillock, DO PCP - General Family Medicine  12/03/18   Sydney Millers, MD Consulting Physician Obstetrics and Gynecology  12/03/18     Chief Complaint  Patient presents with   Headache    Pt reports worsening of sx     Subjective: Pt presents for an OV to follow up on her headaches. Patient reports today she has mild improvement in her headaches.  She is currently at the end of the prednisone taper.  She has successfully tapered up to nortriptyline 20 mg nightly.  She states she still waking up in the middle the night with a headache.  She has not started the caffeine tabs as of yet.  She was hoping to return to work today, but her headache was too severe for her to go. She denies continued nausea or vomit.  Denies fevers, chills or neck pain.  CBC was normal.  Feels her vision has cleared up and is no longer blurry.  She still complains of headache in the back of her head that is improved in the supine position.   Prior note: ED visit for headache and blurred vision.  Patient reports she continues to have headache that starts along the back of her head along the neckline and radiates forward up above her ears.  She reports sometimes this will come with an electric shocklike feeling.  She has had this headache intermittently throughout October since she had COVID-19 back in September 2022.  He has been seen at the emergency room on 2 occasions one for dizziness and visual changes in the other for visual changes and headache most recently on October 25  she is no longer taking the Topamax, she states it was not working.  She has stayed on the Pradaxa because she is fearful of another thrombus, since her initial thrombosis occurred when she had COVID or COVID-vaccine last year. She reports up until she had COVID again in September she was doing well and getting close to back to her normal life.  She has not been tried on any other medications than Topamax for chronic tension/migraine headache.  Reviewed labs, CTs, MRIs from ED visits on October 7 and October 25.  None of which show any acute cause for her continued headaches.  Depression screen [facility] 2/9 08/25/2021 08/27/2020 08/20/2019 12/03/2018  Decreased Interest 0 0 0 0  Down, Depressed, Hopeless 0 0 0 0  PHQ - 2 Score 0 0 0 0    Allergies  Allergen Reactions   Doxycycline Nausea And Vomiting   Social History   Social History Narrative   Lives with son   Right Handed   Drinks no caffeine   Past Medical History:  Diagnosis Date   Anemia    Blood clots in brain    COVID    Headache    History of cold sores    HPV (human papilloma virus) infection    Post-operative nausea and vomiting    Past Surgical History:  Procedure Laterality Date   BLEPHAROPLASTY  2005   CESAREAN SECTION  2002  Family History  Problem Relation Age of Onset   Lung cancer Maternal Grandmother    Lung cancer Maternal Grandfather    Colon cancer Neg Hx    Colon polyps Neg Hx    Esophageal cancer Neg Hx    Rectal cancer Neg Hx    Stomach cancer Neg Hx    Allergies as of 09/02/2021       Reactions   Doxycycline Nausea And Vomiting        Medication List        Accurate as of September 02, 2021  5:59 PM. If you have any questions, ask your nurse or doctor.          STOP taking these medications    acetaminophen 500 MG tablet Commonly known as: TYLENOL Stopped by: Howard Pouch, DO   hydrOXYzine 10 MG tablet Commonly known as: ATARAX/VISTARIL Stopped by: Howard Pouch, DO        TAKE these medications    Biotin 5 MG Tabs Take 5 mg by mouth at bedtime. Chewable   butalbital-acetaminophen-caffeine 50-325-40 MG tablet Commonly known as: FIORICET Take 1 tablet by mouth every 6 (six) hours as needed for headache.   Caffeine 100 MG Tabs Take 2 tablets (200 mg total) by mouth 2 (two) times daily.   dabigatran 150 MG Caps capsule Commonly known as: PRADAXA Take 150 mg by mouth 2 (two) times daily.   multivitamin tablet Take 1 tablet by mouth daily.   nortriptyline 10 MG capsule Commonly known as: Pamelor Take 1-2 capsules (10-20 mg total) by mouth at bedtime.   ondansetron 4 MG tablet Commonly known as: ZOFRAN Take 1 tablet (4 mg total) by mouth every 8 (eight) hours as needed for nausea or vomiting.   predniSONE 20 MG tablet Commonly known as: DELTASONE 60 mg x3d, 40 mg x3d, 20 mg x2d, 10 mg x2d   valACYclovir 500 MG tablet Commonly known as: VALTREX Take 500 mg by mouth 2 (two) times daily.        All past medical history, surgical history, allergies, family history, immunizations andmedications were updated in the EMR today and reviewed under the history and medication portions of their EMR.     ROS: Negative, with the exception of above mentioned in HPI   Objective:  There were no vitals taken for this visit. There is no height or weight on file to calculate BMI. Gen: Afebrile. No acute distress.  Appears uncomfortable uncomfortable HENT: AT. [place].  No cough no hoarseness Eyes:Pupils Equal Round Reactive to light, Extraocular movements intact,  Conjunctiva without redness, discharge or icterus. Skin: No rashes, purpura or petechiae.  Neuro:  Alert. Oriented x3 Psych: Normal affect, dress and demeanor. Normal speech. Normal thought content and judgment.    No results found. No results found. No results found for this or any previous visit (from the past 24 hour(s)).  Assessment/Plan: Sydney [name] is a 55 y.o. female present  for OV for  Chronic cerebral venous sinus thrombosis/Nonintractable headache, unspecified chronicity pattern, unspecified headache type/intracranial hypotension, unspecified Suspect intracranial hypotension as cause of her headaches-uncertain cause versus spontaneous.  She had not started the caffeine tabs prescribed to her.  I strongly encouraged her to do so as soon as possible at 200 mg twice daily over the next couple days.  We discussed pathophysiology of intracranial hypotension today. She is not seeing great improvement, but she has seen some improvement since the prednisone start. Encouraged her to continue to push fluids, water and  electrolyte replacement.   Another round of prednisone to extend over the weekend.   Strongly encouraged her to start caffeine tabs 200 mg twice daily.   Increase nortriptyline 30 mg nightly Zofran p.o. as needed for nausea -has not needed any longer Symptoms are worsening over the weekend, I would encourage her to go back to the emergency room and they may need to consider performing a blood patch for her-if this is indeed intracranial hypotension.  She has no other signs that would suggest infectious causes. She has discussed continuation of her Pradaxa with her hematology team.  Considering she had a cerebral venous sinus thrombosis and chronic left jugular thrombosis, but may have mildly increasing subdural hygromas-anticoagulation can become more complicated. We had placed a urgent neurology referral for her this week as well.   Reviewed expectations re: course of current medical issues. Discussed self-management of symptoms. Outlined signs and symptoms indicating need for more acute intervention. Patient verbalized understanding and all questions were answered. Patient received an After-Visit Summary.    No orders of the defined types were placed in this encounter.  Meds ordered this encounter  Medications   predniSONE (DELTASONE) 20 MG tablet     Sig: 60 mg x3d, 40 mg x3d, 20 mg x2d, 10 mg x2d    Dispense:  18 tablet    Refill:  0     Referral Orders  No referral(s) requested today     Note is dictated utilizing voice recognition software. Although note has been proof read prior to signing, occasional typographical errors still can be missed. If any questions arise, please do not hesitate to call for verification.   electronically signed by:  Howard Pouch, DO  [facility]

## 2021-09-02 NOTE — Telephone Encounter (Signed)
She was asked to follow-up within 1 to 2 weeks on call back 08/31/2021. In order to provide her a work excuse or further medication changes, I would have to reevaluate her.  This can be a virtual appointment if she desires and has virtual appointment capabilities.  Lyrica, technically is on the controlled substance list, therefore we would need to see each other in order for me to initiate it.  In the meantime I would encourage her to increase the nortriptyline to 30 mg nightly.  Use daily caffeine tabs or her Fioricet.  Right now I still have an 1130 open on Monday which she can be placed in.  Then I am out the majority of the rest of the week.

## 2021-09-02 NOTE — Telephone Encounter (Signed)
Per scheduling message Sarah called and gave upcoming appointment - confirmed (1) dose of IV Iron 

## 2021-09-02 NOTE — Telephone Encounter (Signed)
Please schedule pt for 4 today.

## 2021-09-02 NOTE — Telephone Encounter (Signed)
Please advise if pt needs appt for the following

## 2021-09-04 ENCOUNTER — Encounter: Payer: Self-pay | Admitting: Family

## 2021-09-05 ENCOUNTER — Encounter: Payer: Self-pay | Admitting: *Deleted

## 2021-09-07 ENCOUNTER — Other Ambulatory Visit (HOSPITAL_COMMUNITY): Payer: No Typology Code available for payment source

## 2021-09-08 ENCOUNTER — Emergency Department (HOSPITAL_BASED_OUTPATIENT_CLINIC_OR_DEPARTMENT_OTHER)
Admission: EM | Admit: 2021-09-08 | Discharge: 2021-09-08 | Disposition: A | Discharge status: Home / Self Care | Payer: No Typology Code available for payment source | Attending: Emergency Medicine | Admitting: Emergency Medicine

## 2021-09-08 ENCOUNTER — Other Ambulatory Visit: Payer: Self-pay

## 2021-09-08 ENCOUNTER — Emergency Department (HOSPITAL_BASED_OUTPATIENT_CLINIC_OR_DEPARTMENT_OTHER): Payer: No Typology Code available for payment source

## 2021-09-08 ENCOUNTER — Encounter (HOSPITAL_BASED_OUTPATIENT_CLINIC_OR_DEPARTMENT_OTHER): Payer: Self-pay | Admitting: Obstetrics and Gynecology

## 2021-09-08 DIAGNOSIS — R519 Headache, unspecified: Secondary | ICD-10-CM | POA: Diagnosis not present

## 2021-09-08 DIAGNOSIS — N183 Chronic kidney disease, stage 3 unspecified: Secondary | ICD-10-CM | POA: Diagnosis not present

## 2021-09-08 DIAGNOSIS — Z79899 Other long term (current) drug therapy: Secondary | ICD-10-CM | POA: Diagnosis not present

## 2021-09-08 DIAGNOSIS — R112 Nausea with vomiting, unspecified: Secondary | ICD-10-CM | POA: Insufficient documentation

## 2021-09-08 DIAGNOSIS — I129 Hypertensive chronic kidney disease with stage 1 through stage 4 chronic kidney disease, or unspecified chronic kidney disease: Secondary | ICD-10-CM | POA: Insufficient documentation

## 2021-09-08 DIAGNOSIS — Z8616 Personal history of COVID-19: Secondary | ICD-10-CM | POA: Insufficient documentation

## 2021-09-08 HISTORY — DX: COVID-19: U07.1

## 2021-09-08 LAB — URINALYSIS, ROUTINE W REFLEX MICROSCOPIC
Bilirubin Urine: NEGATIVE
Glucose, UA: NEGATIVE mg/dL
Hgb urine dipstick: NEGATIVE
Ketones, ur: NEGATIVE mg/dL
Leukocytes,Ua: NEGATIVE
Nitrite: NEGATIVE
Protein, ur: NEGATIVE mg/dL
Specific Gravity, Urine: 1.01 (ref 1.005–1.030)
pH: 5 (ref 5.0–8.0)

## 2021-09-08 LAB — CBC WITH DIFFERENTIAL/PLATELET
Abs Immature Granulocytes: 0.07 10*3/uL (ref 0.00–0.07)
Basophils Absolute: 0 10*3/uL (ref 0.0–0.1)
Basophils Relative: 0 %
Eosinophils Absolute: 0 10*3/uL (ref 0.0–0.5)
Eosinophils Relative: 0 %
HCT: 42.1 % (ref 36.0–46.0)
Hemoglobin: 13.6 g/dL (ref 12.0–15.0)
Immature Granulocytes: 1 %
Lymphocytes Relative: 5 %
Lymphs Abs: 0.6 10*3/uL — ABNORMAL LOW (ref 0.7–4.0)
MCH: 30 pg (ref 26.0–34.0)
MCHC: 32.3 g/dL (ref 30.0–36.0)
MCV: 92.7 fL (ref 80.0–100.0)
Monocytes Absolute: 0.1 10*3/uL (ref 0.1–1.0)
Monocytes Relative: 1 %
Neutro Abs: 12.1 10*3/uL — ABNORMAL HIGH (ref 1.7–7.7)
Neutrophils Relative %: 93 %
Platelets: 269 10*3/uL (ref 150–400)
RBC: 4.54 MIL/uL (ref 3.87–5.11)
RDW: 14.6 % (ref 11.5–15.5)
WBC: 12.9 10*3/uL — ABNORMAL HIGH (ref 4.0–10.5)
nRBC: 0 % (ref 0.0–0.2)

## 2021-09-08 LAB — IRON AND TIBC
Iron: 24 ug/dL — ABNORMAL LOW (ref 28–170)
Saturation Ratios: 7 % — ABNORMAL LOW (ref 10.4–31.8)
TIBC: 364 ug/dL (ref 250–450)
UIBC: 340 ug/dL

## 2021-09-08 LAB — COMPREHENSIVE METABOLIC PANEL
ALT: 11 U/L (ref 0–44)
AST: 13 U/L — ABNORMAL LOW (ref 15–41)
Albumin: 4.1 g/dL (ref 3.5–5.0)
Alkaline Phosphatase: 55 U/L (ref 38–126)
Anion gap: 7 (ref 5–15)
BUN: 19 mg/dL (ref 6–20)
CO2: 29 mmol/L (ref 22–32)
Calcium: 10 mg/dL (ref 8.9–10.3)
Chloride: 102 mmol/L (ref 98–111)
Creatinine, Ser: 0.88 mg/dL (ref 0.44–1.00)
GFR, Estimated: >60 mL/min (ref >60)
Glucose, Bld: 108 mg/dL — ABNORMAL HIGH (ref 70–99)
Potassium: 4 mmol/L (ref 3.5–5.1)
Sodium: 138 mmol/L (ref 135–145)
Total Bilirubin: 0.3 mg/dL (ref 0.3–1.2)
Total Protein: 7.9 g/dL (ref 6.5–8.1)

## 2021-09-08 IMAGING — CT CT ANGIO HEAD-NECK (W OR W/O PERF)
1 of 11 series · 5 of 33 positions shown · IV contrast (omnipaque)
Comparison: Recent CT and MR imaging

CLINICAL DATA: Stroke/TIA, assess intracranial arteries

EXAM:
CT ANGIOGRAPHY HEAD AND NECK
TECHNIQUE: Multidetector CT imaging of the head and neck was performed using
the standard protocol during bolus administration of intravenous
contrast. Multiplanar CT image reconstructions and MIPs were
obtained to evaluate the vascular anatomy. Carotid stenosis
measurements (when applicable) are obtained utilizing NASCET
criteria, using the distal internal carotid diameter as the
denominator.
CONTRAST:  75mL OMNIPAQUE IOHEXOL 350 MG/ML SOLN

[Series 10: ax thin · axial · 0.50mm/px · z∈[+1127,+1354]mm · 5 of 341 slices shown]
[im 57/341  soft-tissue]
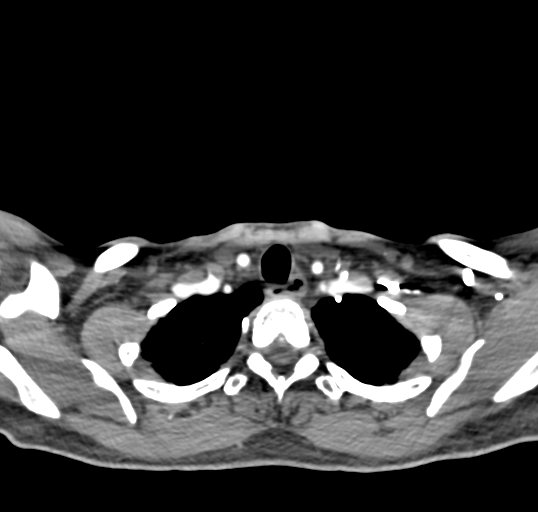
[im 114/341  bone]
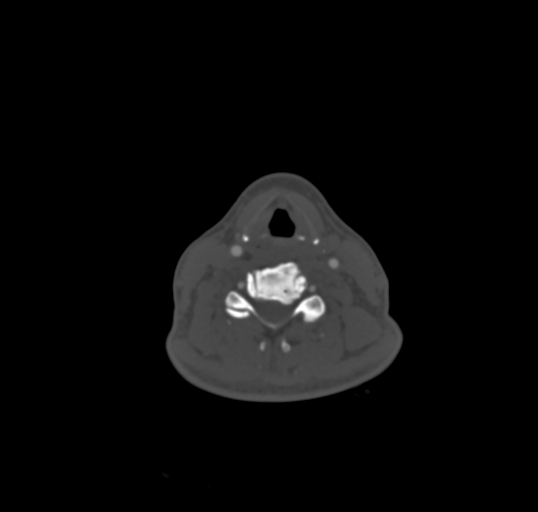
[im 171/341  soft-tissue]
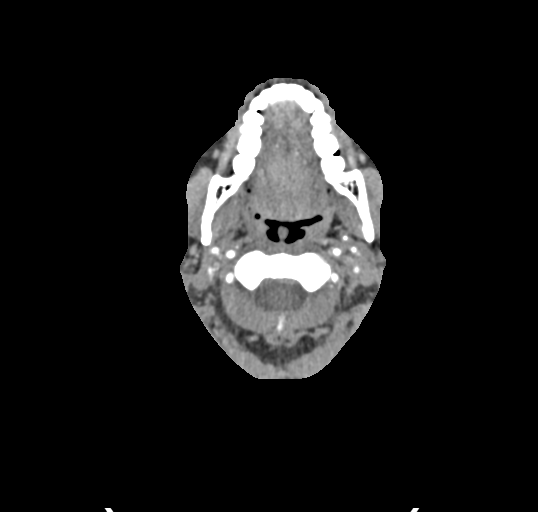
[im 227/341  bone]
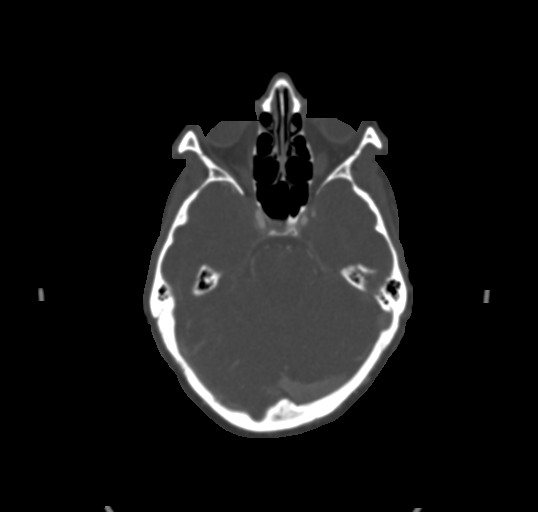
[im 284/341  soft-tissue]
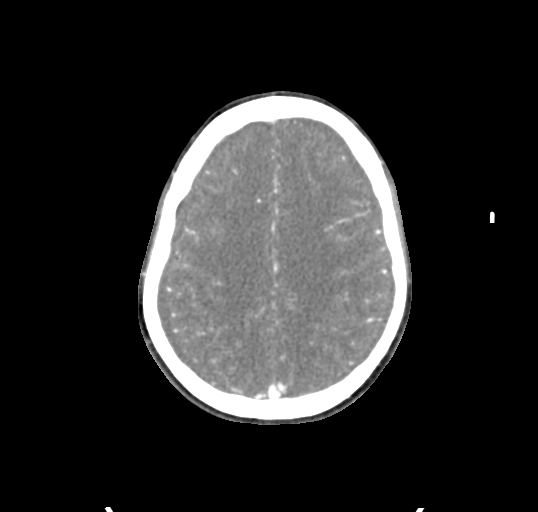

[5 of 33 positions shown; findings below may reference images not displayed]

FINDINGS: CT HEAD

Brain: There is no acute intracranial hemorrhage, mass effect, or
edema. Gray-white differentiation is preserved. Persistent thin
bilateral hypodense thin subdural collections as seen on MRI.
Ventricles and sulci are within normal limits in size and
configuration.

Vascular: Better evaluated on CTA portion.

Skull: Calvarium is unremarkable.

Sinuses/Orbits: No acute finding.

Other: None.

Review of the MIP images confirms the above findings

CTA NECK

Aortic arch: Great vessel origins are patent.

Right carotid system: Patent.  No stenosis.

Left carotid system: Patent.  No stenosis.

Vertebral arteries: Patent and codominant.  No stenosis.

Skeleton: Stable cervical spine degenerative changes, greatest at
C5-C6 and C6-C7.

Other neck: No new abnormality.

Upper chest: Included lung apices are clear.

Review of the MIP images confirms the above findings

CTA HEAD

Anterior circulation: Intracranial internal carotid arteries are
patent with trace calcified plaque. Anterior and middle cerebral
arteries are patent.

Posterior circulation: Intracranial vertebral arteries are patent
with trace calcified plaque. Basilar artery is patent. Posterior
cerebral arteries are patent. There is fetal origin of the right
PCA.

Venous sinuses: Patent as allowed by contrast bolus timing.

Review of the MIP images confirms the above findings
IMPRESSION: No acute intracranial abnormality. Persistent thin bilateral
subdural hygromas without mass effect.

No new vascular abnormality.

## 2021-09-08 MED ORDER — IOHEXOL 350 MG/ML SOLN
75.0000 mL | Freq: Once | INTRAVENOUS | Status: AC | PRN
  Administered 2021-09-08: 75 mL via INTRAVENOUS

## 2021-09-08 MED ORDER — DIPHENHYDRAMINE HCL 50 MG/ML IJ SOLN
50.0000 mg | Freq: Once | INTRAMUSCULAR | Status: AC
  Administered 2021-09-08: 50 mg via INTRAVENOUS
  Filled 2021-09-08: qty 1

## 2021-09-08 MED ORDER — METOCLOPRAMIDE HCL 5 MG/ML IJ SOLN
10.0000 mg | Freq: Once | INTRAMUSCULAR | Status: AC
  Administered 2021-09-08: 10 mg via INTRAVENOUS
  Filled 2021-09-08: qty 2

## 2021-09-08 MED ORDER — SODIUM CHLORIDE 0.9 % IV BOLUS
1000.0000 mL | Freq: Once | INTRAVENOUS | Status: AC
  Administered 2021-09-08: 1000 mL via INTRAVENOUS

## 2021-09-08 NOTE — Discharge Instructions (Signed)
You came to the emergency department today to be evaluated for your headache.  Your physical exam and lab work were reassuring.  The CT scan of your head and neck showed no acute abnormalities.  You were given a migraine cocktail with improvement in your symptoms.  Please follow-up with your neurologist at [facility] for an appointment as soon as possible.  Get help right away if: Your migraine becomes severe and medicine does not help. You have a stiff neck and fever. You have a loss of vision. You have muscle weakness or loss of muscle control. You start losing your balance, or you have trouble walking. You feel like you may faint, or you faint. You start having sudden and unexpected, severe headaches. You have a seizure.

## 2021-09-08 NOTE — ED Notes (Signed)
Pt discharged home per MD order. Discharge summary reviewed with pt, pt verbalizes understanding. Ambulatory off unit. No s/s of acute distress noted at discharge.

## 2021-09-08 NOTE — ED Triage Notes (Signed)
Patient reports to the ER for headaches and nausea with emesis. Patient repors she has been diagnosed with Long Covid and is having cyclic migraines and emesis. Patient reports she is supposed to have an iron infusion tomorrow as she was told her iron was low. Patient reports blood clots in her brain and her jugular.

## 2021-09-08 NOTE — ED Provider Notes (Signed)
[facility] EMERGENCY DEPT Provider Note   CSN: [id number] Arrival date & time: 09/08/21  1131     History Chief Complaint  Patient presents with   Headache    Sydney [name] is a 55 y.o. female with a history of intracranial hypotension, non-intractable headaches, chronic internal jugular vein thrombosis, cerebral venous sinus thrombosis.  Patient presents emergency department with a chief complaint of headache.  Patient reports that she had sudden onset of headache this morning at approximately 0 300.  Patient states that headache onset was "felt like getting hit in the head with a sledgehammer."  Pain has been constant since then however has improved slightly.  Patient states that at present it feels like "my head is in a vice grip."  Patient rates pain 8/10 on the pain scale.  Patient is worse with bright lights and sudden head movements.  Patient has not tried any modalities to alleviate her symptoms.  Patient endorses associated nausea and vomiting.  Patient has vomited 4 times in the last 24 hours.  Patient just denies any hematemesis or coffee-ground emesis.  Patient reports that she has baseline decree sensation to left side of her face from previous CVA that occurred in 2021.  States that this is unchanged.  Patient has history of headaches.  Has been dealing with headaches since September 2022.  Patient is prescribed nortriptyline and caffeine however states that she is not taking either these medications.  Patient is followed by Dr. Leonie Man with [facility] neurology.  Patient denies any numbness, weakness, facial asymmetry, dysarthria visual disturbance, abdominal pain, neck pain, neck stiffness.   Headache Associated symptoms: no abdominal pain, no back pain, no dizziness, no fever, no nausea, no neck pain, no neck stiffness, no numbness, no seizures, no vomiting and no weakness       Past Medical History:  Diagnosis Date   Anemia    Blood clots in brain     COVID    COVID-19    Sept 2022   Headache    History of cold sores    HPV (human papilloma virus) infection    Post-operative nausea and vomiting     Patient Active Problem List   Diagnosis Date Noted   Intracranial hypotension, unspecified 09/02/2021   Dizziness 08/31/2021   Nonintractable headache 08/25/2021   Hot flashes 03/24/2021   Elevated blood protein 03/24/2021   Chronic internal jugular vein thrombosis, left ([place]) 12/06/2020   Chronic anticoagulation 11/12/2020   Cerebral venous sinus thrombosis 10/28/2020   Iron deficiency anemia 09/10/2019   CKD (chronic kidney disease) stage 3, GFR 30-59 ml/min ([place]) 09/10/2019    Past Surgical History:  Procedure Laterality Date   BLEPHAROPLASTY  2005   CESAREAN SECTION  2002     OB History     Gravida  1   Para  1   Term      Preterm      AB      Living  1      SAB      IAB      Ectopic      Multiple      Live Births              Family History  Problem Relation Age of Onset   Lung cancer Maternal Grandmother    Lung cancer Maternal Grandfather    Colon cancer Neg Hx    Colon polyps Neg Hx    Esophageal cancer Neg Hx    Rectal  cancer Neg Hx    Stomach cancer Neg Hx     Social History   Tobacco Use   Smoking status: Never   Smokeless tobacco: Never  Vaping Use   Vaping Use: Never used  Substance Use Topics   Alcohol use: Never   Drug use: Never    Home Medications Prior to Admission medications   Medication Sig Start Date End Date Taking? Authorizing Provider  Biotin 5 MG TABS Take 5 mg by mouth at bedtime. Chewable    [provider]  butalbital-acetaminophen-caffeine (FIORICET) 50-325-40 MG tablet Take 1 tablet by mouth every 6 (six) hours as needed for headache. Patient not taking: Reported on 09/02/2021 08/18/21   Garvin Fila, MD  Caffeine 100 MG TABS Take 2 tablets (200 mg total) by mouth 2 (two) times daily. Patient not taking: Reported on 09/02/2021 08/31/21    Howard Pouch A, DO  dabigatran (PRADAXA) 150 MG CAPS capsule Take 150 mg by mouth 2 (two) times daily.    [provider]  Multiple Vitamin (MULTIVITAMIN) tablet Take 1 tablet by mouth daily.    [provider]  nortriptyline (PAMELOR) 10 MG capsule Take 1-2 capsules (10-20 mg total) by mouth at bedtime. 08/25/21   Kuneff, Renee A, DO  ondansetron (ZOFRAN) 4 MG tablet Take 1 tablet (4 mg total) by mouth every 8 (eight) hours as needed for nausea or vomiting. 08/25/21   Kuneff, Renee A, DO  predniSONE (DELTASONE) 20 MG tablet 60 mg x3d, 40 mg x3d, 20 mg x2d, 10 mg x2d 09/02/21   Kuneff, Renee A, DO  valACYclovir (VALTREX) 500 MG tablet Take 500 mg by mouth 2 (two) times daily. 08/19/21   [provider]    Allergies    Doxycycline  Review of Systems   Review of Systems  Constitutional:  Negative for chills and fever.  Eyes:  Negative for visual disturbance.  Respiratory:  Negative for shortness of breath.   Cardiovascular:  Negative for chest pain.  Gastrointestinal:  Negative for abdominal pain, nausea and vomiting.  Genitourinary:  Negative for difficulty urinating, dysuria, hematuria and urgency.  Musculoskeletal:  Negative for back pain, neck pain and neck stiffness.  Skin:  Negative for color change and rash.  Neurological:  Positive for headaches. Negative for dizziness, tremors, seizures, syncope, facial asymmetry, speech difficulty, weakness, light-headedness and numbness.  Psychiatric/Behavioral:  Negative for confusion.    Physical Exam Updated Vital Signs BP 131/80   Pulse 70   Temp 98.7 F (37.1 C)   Resp 18   SpO2 100%   Physical Exam Vitals and nursing note reviewed.  Constitutional:      General: She is not in acute distress.    Appearance: She is not ill-appearing, toxic-appearing or diaphoretic.  Eyes:     General: No scleral icterus.       Right eye: No discharge.        Left eye: No discharge.     Extraocular Movements:  Extraocular movements intact.     Conjunctiva/sclera: Conjunctivae normal.     Pupils: Pupils are equal, round, and reactive to light.  Cardiovascular:     Rate and Rhythm: Normal rate.  Pulmonary:     Effort: Pulmonary effort is normal.  Abdominal:     Palpations: Abdomen is soft.     Tenderness: There is no abdominal tenderness.  Musculoskeletal:     Cervical back: Normal range of motion and neck supple. No rigidity.  Skin:    General: Skin  is warm and dry.  Neurological:     General: No focal deficit present.     Mental Status: She is alert.     GCS: GCS eye subscore is 4. GCS verbal subscore is 5. GCS motor subscore is 6.     Cranial Nerves: No cranial nerve deficit, dysarthria or facial asymmetry.     Sensory: Sensation is intact.     Motor: No weakness, tremor, seizure activity or pronator drift.     Coordination: Finger-Nose-Finger Test and Heel to [place] Test normal.     Gait: Gait is intact. Gait normal.     Comments: CN II-XII intact, equal grip strength, +5 strength to bilateral upper and lower extremities, patient reports decree sensation to left side of her face however states this is baseline for her.  Sensation to light touch intact to bilateral upper and lower extremities.  Psychiatric:        Behavior: Behavior is cooperative.    ED Results / Procedures / Treatments   Labs (all labs ordered are listed, but only abnormal results are displayed) Labs Reviewed  COMPREHENSIVE METABOLIC PANEL - Abnormal; Notable for the following components:      Result Value   Glucose, Bld 108 (*)    AST 13 (*)    All other components within normal limits  CBC WITH DIFFERENTIAL/PLATELET - Abnormal; Notable for the following components:   WBC 12.9 (*)    Neutro Abs 12.1 (*)    Lymphs Abs 0.6 (*)    All other components within normal limits  URINALYSIS, ROUTINE W REFLEX MICROSCOPIC - Abnormal; Notable for the following components:   Color, Urine COLORLESS (*)    All other  components within normal limits  IRON AND TIBC    EKG None  Radiology CT ANGIO HEAD NECK W WO CM  Result Date: 09/08/2021 CLINICAL DATA:  Stroke/TIA, assess intracranial arteries EXAM: CT ANGIOGRAPHY HEAD AND NECK TECHNIQUE: Multidetector CT imaging of the head and neck was performed using the standard protocol during bolus administration of intravenous contrast. Multiplanar CT image reconstructions and MIPs were obtained to evaluate the vascular anatomy. Carotid stenosis measurements (when applicable) are obtained utilizing NASCET criteria, using the distal internal carotid diameter as the denominator. CONTRAST:  89mL OMNIPAQUE IOHEXOL 350 MG/ML SOLN COMPARISON:  Recent CT and MR imaging FINDINGS: CT HEAD Brain: There is no acute intracranial hemorrhage, mass effect, or edema. Gray-white differentiation is preserved. Persistent thin bilateral hypodense thin subdural collections as seen on MRI. Ventricles and sulci are within normal limits in size and configuration. Vascular: Better evaluated on CTA portion. Skull: Calvarium is unremarkable. Sinuses/Orbits: No acute finding. Other: None. Review of the MIP images confirms the above findings CTA NECK Aortic arch: Great vessel origins are patent. Right carotid system: Patent.  No stenosis. Left carotid system: Patent.  No stenosis. Vertebral arteries: Patent and codominant.  No stenosis. Skeleton: Stable cervical spine degenerative changes, greatest at C5-C6 and C6-C7. Other neck: No new abnormality. Upper chest: Included lung apices are clear. Review of the MIP images confirms the above findings CTA HEAD Anterior circulation: Intracranial internal carotid arteries are patent with trace calcified plaque. Anterior and middle cerebral arteries are patent. Posterior circulation: Intracranial vertebral arteries are patent with trace calcified plaque. Basilar artery is patent. Posterior cerebral arteries are patent. There is fetal origin of the right PCA. Venous  sinuses: Patent as allowed by contrast bolus timing. Review of the MIP images confirms the above findings IMPRESSION: No acute intracranial abnormality. Persistent  thin bilateral subdural hygromas without mass effect. No new vascular abnormality. Electronically Signed   By: Macy Mis M.D.   On: 09/08/2021 15:56    Procedures Procedures   Medications Ordered in ED Medications  metoCLOPramide (REGLAN) injection 10 mg (10 mg Intravenous Given 09/08/21 1435)  diphenhydrAMINE (BENADRYL) injection 50 mg (50 mg Intravenous Given 09/08/21 1434)  sodium chloride 0.9 % bolus 1,000 mL (0 mLs Intravenous Stopped 09/08/21 1601)  iohexol (OMNIPAQUE) 350 MG/ML injection 75 mL (75 mLs Intravenous Contrast Given 09/08/21 1529)    ED Course  I have reviewed the triage vital signs and the nursing notes.  Pertinent labs & imaging results that were available during my care of the patient were reviewed by me and considered in my medical decision making (see chart for details).    MDM Rules/Calculators/A&P                           Alert 55 year old female no acute stress, nontoxic-appearing.  Presents emergency department complaint of headache.  Patient reports sudden onset of headache this morning at 0 300.  Denies any numbness, weakness, facial asymmetry, visual disturbance.  Neuro exam is reassuring.  Due to patient's report of sudden onset of headache concern for possible subarachnoid hemorrhage.  Will obtain CTA of head and neck for further evaluation.  Plan to give patient migraine cocktail.  Patient reports that she is due for a iron infusion tomorrow.  Is very concerned about her iron levels.  Will obtain TIBC and iron level.  EKG shows sinus bradycardia.  Patient reports improvement in headache after receiving migraine cocktail.  CTA shows no acute abnormalities.  Patient continues to have no focal neurological deficits.  We will have patient follow-up with her neurologist at [facility] neurology.     Discussed results, findings, treatment and follow up. Patient advised of return precautions. Patient verbalized understanding and agreed with plan.   Final Clinical Impression(s) / ED Diagnoses Final diagnoses:  Acute nonintractable headache, unspecified headache type    Rx / DC Orders ED Discharge Orders     None        Loni Beckwith, PA-C 09/08/21 1704    Lucrezia Starch, MD 09/09/21 1330

## 2021-09-09 ENCOUNTER — Inpatient Hospital Stay: Payer: No Typology Code available for payment source | Attending: Family

## 2021-09-09 VITALS — BP 121/73 | HR 55 | Temp 98.5°F | Resp 18

## 2021-09-09 DIAGNOSIS — R55 Syncope and collapse: Secondary | ICD-10-CM | POA: Diagnosis not present

## 2021-09-09 DIAGNOSIS — R112 Nausea with vomiting, unspecified: Secondary | ICD-10-CM | POA: Diagnosis not present

## 2021-09-09 DIAGNOSIS — R519 Headache, unspecified: Secondary | ICD-10-CM | POA: Diagnosis not present

## 2021-09-09 DIAGNOSIS — Z7901 Long term (current) use of anticoagulants: Secondary | ICD-10-CM | POA: Insufficient documentation

## 2021-09-09 DIAGNOSIS — D6859 Other primary thrombophilia: Secondary | ICD-10-CM | POA: Diagnosis present

## 2021-09-09 DIAGNOSIS — Z79899 Other long term (current) drug therapy: Secondary | ICD-10-CM | POA: Insufficient documentation

## 2021-09-09 DIAGNOSIS — R0602 Shortness of breath: Secondary | ICD-10-CM | POA: Diagnosis not present

## 2021-09-09 DIAGNOSIS — G08 Intracranial and intraspinal phlebitis and thrombophlebitis: Secondary | ICD-10-CM | POA: Insufficient documentation

## 2021-09-09 DIAGNOSIS — D509 Iron deficiency anemia, unspecified: Secondary | ICD-10-CM

## 2021-09-09 MED ORDER — SODIUM CHLORIDE 0.9 % IV SOLN: Freq: Once | INTRAVENOUS | Status: AC

## 2021-09-09 MED ORDER — SODIUM CHLORIDE 0.9 % IV SOLN
1000.0000 mg | Freq: Once | INTRAVENOUS | Status: AC
  Administered 2021-09-09: 1000 mg via INTRAVENOUS
  Filled 2021-09-09: qty 10

## 2021-09-09 NOTE — Patient Instructions (Signed)

## 2021-09-12 ENCOUNTER — Encounter: Payer: Self-pay | Admitting: Family

## 2021-09-12 ENCOUNTER — Other Ambulatory Visit: Payer: Self-pay | Admitting: Family

## 2021-09-13 ENCOUNTER — Ambulatory Visit: Payer: No Typology Code available for payment source

## 2021-09-16 ENCOUNTER — Other Ambulatory Visit: Payer: Self-pay | Admitting: Family Medicine

## 2021-09-16 ENCOUNTER — Other Ambulatory Visit (HOSPITAL_BASED_OUTPATIENT_CLINIC_OR_DEPARTMENT_OTHER): Payer: No Typology Code available for payment source

## 2021-09-27 ENCOUNTER — Other Ambulatory Visit (HOSPITAL_BASED_OUTPATIENT_CLINIC_OR_DEPARTMENT_OTHER): Payer: No Typology Code available for payment source

## 2021-10-12 ENCOUNTER — Other Ambulatory Visit (HOSPITAL_BASED_OUTPATIENT_CLINIC_OR_DEPARTMENT_OTHER): Payer: No Typology Code available for payment source

## 2021-10-14 ENCOUNTER — Other Ambulatory Visit: Payer: Self-pay

## 2021-10-14 ENCOUNTER — Ambulatory Visit (INDEPENDENT_AMBULATORY_CARE_PROVIDER_SITE_OTHER): Payer: No Typology Code available for payment source

## 2021-10-14 DIAGNOSIS — I82C12 Acute embolism and thrombosis of left internal jugular vein: Secondary | ICD-10-CM | POA: Diagnosis not present

## 2021-10-14 DIAGNOSIS — R002 Palpitations: Secondary | ICD-10-CM

## 2021-10-14 DIAGNOSIS — R55 Syncope and collapse: Secondary | ICD-10-CM | POA: Diagnosis not present

## 2021-10-14 LAB — ECHOCARDIOGRAM COMPLETE
AR max vel: 1.61 cm2
AV Area VTI: 2.04 cm2
AV Area mean vel: 2.04 cm2
AV Mean grad: 3 mmHg
AV Peak grad: 8.6 mmHg
AV Vena cont: 0.22 cm
Ao pk vel: 1.47 m/s
Area-P 1/2: 3.81 cm2
Calc EF: 66.2 %
S' Lateral: 2.97 cm
Single Plane A2C EF: 69.2 %
Single Plane A4C EF: 66.2 %

## 2021-10-18 ENCOUNTER — Ambulatory Visit: Payer: No Typology Code available for payment source

## 2021-10-19 ENCOUNTER — Inpatient Hospital Stay (HOSPITAL_BASED_OUTPATIENT_CLINIC_OR_DEPARTMENT_OTHER): Payer: No Typology Code available for payment source | Admitting: Family

## 2021-10-19 ENCOUNTER — Encounter: Payer: Self-pay | Admitting: Family

## 2021-10-19 ENCOUNTER — Other Ambulatory Visit: Payer: Self-pay

## 2021-10-19 ENCOUNTER — Inpatient Hospital Stay: Payer: No Typology Code available for payment source | Attending: Family

## 2021-10-19 VITALS — BP 131/85 | HR 80 | Temp 99.1°F | Resp 16 | Wt 128.1 lb

## 2021-10-19 DIAGNOSIS — I82C22 Chronic embolism and thrombosis of left internal jugular vein: Secondary | ICD-10-CM

## 2021-10-19 DIAGNOSIS — G08 Intracranial and intraspinal phlebitis and thrombophlebitis: Secondary | ICD-10-CM | POA: Insufficient documentation

## 2021-10-19 DIAGNOSIS — D6859 Other primary thrombophilia: Secondary | ICD-10-CM

## 2021-10-19 DIAGNOSIS — I82C12 Acute embolism and thrombosis of left internal jugular vein: Secondary | ICD-10-CM | POA: Diagnosis not present

## 2021-10-19 DIAGNOSIS — D509 Iron deficiency anemia, unspecified: Secondary | ICD-10-CM | POA: Diagnosis not present

## 2021-10-19 DIAGNOSIS — Z79899 Other long term (current) drug therapy: Secondary | ICD-10-CM | POA: Diagnosis not present

## 2021-10-19 DIAGNOSIS — Z7901 Long term (current) use of anticoagulants: Secondary | ICD-10-CM | POA: Insufficient documentation

## 2021-10-19 LAB — CMP (CANCER CENTER ONLY)
ALT: 12 U/L (ref 0–44)
AST: 22 U/L (ref 15–41)
Albumin: 4.7 g/dL (ref 3.5–5.0)
Alkaline Phosphatase: 60 U/L (ref 38–126)
Anion gap: 7 (ref 5–15)
BUN: 19 mg/dL (ref 6–20)
CO2: 29 mmol/L (ref 22–32)
Calcium: 10.6 mg/dL — ABNORMAL HIGH (ref 8.9–10.3)
Chloride: 101 mmol/L (ref 98–111)
Creatinine: 1.05 mg/dL — ABNORMAL HIGH (ref 0.44–1.00)
GFR, Estimated: >60 mL/min (ref >60)
Glucose, Bld: 80 mg/dL (ref 70–99)
Potassium: 4 mmol/L (ref 3.5–5.1)
Sodium: 137 mmol/L (ref 135–145)
Total Bilirubin: 0.4 mg/dL (ref 0.3–1.2)
Total Protein: 8.4 g/dL — ABNORMAL HIGH (ref 6.5–8.1)

## 2021-10-19 LAB — CBC WITH DIFFERENTIAL (CANCER CENTER ONLY)
Abs Immature Granulocytes: 0.01 10*3/uL (ref 0.00–0.07)
Basophils Absolute: 0 10*3/uL (ref 0.0–0.1)
Basophils Relative: 1 %
Eosinophils Absolute: 0.1 10*3/uL (ref 0.0–0.5)
Eosinophils Relative: 2 %
HCT: 40.5 % (ref 36.0–46.0)
Hemoglobin: 13.4 g/dL (ref 12.0–15.0)
Immature Granulocytes: 0 %
Lymphocytes Relative: 30 %
Lymphs Abs: 1.1 10*3/uL (ref 0.7–4.0)
MCH: 30.7 pg (ref 26.0–34.0)
MCHC: 33.1 g/dL (ref 30.0–36.0)
MCV: 92.7 fL (ref 80.0–100.0)
Monocytes Absolute: 0.2 10*3/uL (ref 0.1–1.0)
Monocytes Relative: 6 %
Neutro Abs: 2.3 10*3/uL (ref 1.7–7.7)
Neutrophils Relative %: 61 %
Platelet Count: 241 10*3/uL (ref 150–400)
RBC: 4.37 MIL/uL (ref 3.87–5.11)
RDW: 13.3 % (ref 11.5–15.5)
WBC Count: 3.8 10*3/uL — ABNORMAL LOW (ref 4.0–10.5)
nRBC: 0 % (ref 0.0–0.2)

## 2021-10-19 LAB — LACTATE DEHYDROGENASE: LDH: 147 U/L (ref 98–192)

## 2021-10-19 NOTE — Progress Notes (Signed)
Hematology and Oncology Follow Up Visit  Sydney [name] [id number] [birth year] 55 y.o. 10/19/2021   Principle Diagnosis:  Cerebral venous sinus thrombosis  Chronic left jugular tho Protein S activity deficiency (mild) Iron deficiency anemia    Current Therapy:        Pradaxa 150 mg PO BID IV iron as indicated    Interim History:  Sydney [name] is here today for follow-up. She is doing so much better. She has been to see [facility] Dr. Modena Nunnery and has started Glutathione injections weekly. She also gets a Meyers cocktail after each injection as well. 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  She will also be starting Ozone therapy in the new year.  She also had a severe intestinal infection and Mono.  She has started running again and doing light exercise. Overall she is trying take it easy and let her body heal.  She denies fever, chills, n/v, cough, rash, dizziness, SOB, chest pain, palpitations, abdominal pain or changes in bowel or bladder habits at this time.  She still has headaches but they are less frequent and not as painful.  She still notes her vision to be blurry.  No swelling, tenderness, numbness or tingling in her extremities at this time.  No falls or syncope to report.  She has been eating well and doing her best to stay well hydrated. Her weight is stable at 128 lbs.   ECOG Performance Status: 1 - Symptomatic but completely ambulatory  Medications:  Allergies as of 10/19/2021       Reactions   Covid-19 (mrna) Vaccine Anaphylaxis   BLOOD CLOTS   Doxycycline Nausea And Vomiting        Medication List        Accurate as of October 19, 2021  1:29 PM. If you have any questions, ask your nurse or doctor.          Biotin 5 MG Tabs Take 5 mg by mouth at bedtime. Chewable   butalbital-acetaminophen-caffeine 50-325-40 MG tablet Commonly known as: FIORICET Take 1 tablet by mouth every 6 (six)  hours as needed for headache.   Caffeine 100 MG Tabs Take 2 tablets (200 mg total) by mouth 2 (two) times daily.   dabigatran 150 MG Caps capsule Commonly known as: PRADAXA Take 150 mg by mouth 2 (two) times daily.   magnesium oxide 400 MG tablet Commonly known as: MAG-OX Take 800 mg by mouth daily.   multivitamin tablet Take 1 tablet by mouth daily.   nortriptyline 10 MG capsule Commonly known as: PAMELOR TAKE 1-2 CAPSULES (10-20 MG TOTAL) BY MOUTH AT BEDTIME.   ondansetron 4 MG tablet Commonly known as: ZOFRAN Take 1 tablet (4 mg total) by mouth every 8 (eight) hours as needed for nausea or vomiting.   predniSONE 20 MG tablet Commonly known as: DELTASONE 60 mg x3d, 40 mg x3d, 20 mg x2d, 10 mg x2d   valACYclovir 500 MG tablet Commonly known as: VALTREX Take 500 mg by mouth 2 (two) times daily.        Allergies:  Allergies  Allergen Reactions   Covid-19 (Mrna) Vaccine Anaphylaxis    BLOOD CLOTS   Doxycycline Nausea And Vomiting    Past Medical History, Surgical history, Social history, and Family History were reviewed and updated.  Review of Systems: All other 10 point review of systems is negative.   Physical Exam:  weight is 128 lb 1.3 oz (58.1 kg). Her oral  temperature is 99.1 F (37.3 C). Her blood pressure is 131/85 and her pulse is 80. Her respiration is 16 and oxygen saturation is 100%.   Wt Readings from Last 3 Encounters:  10/19/21 128 lb 1.3 oz (58.1 kg)  08/29/21 126 lb (57.2 kg)  08/25/21 122 lb (55.3 kg)    Ocular: Sclerae unicteric, pupils equal, round and reactive to light Ear-nose-throat: Oropharynx clear, dentition fair Lymphatic: No cervical or supraclavicular adenopathy Lungs no rales or rhonchi, good excursion bilaterally Heart regular rate and rhythm, no murmur appreciated Abd soft, nontender, positive bowel sounds MSK no focal spinal tenderness, no joint edema Neuro: non-focal, well-oriented, appropriate affect Breasts: Deferred    Lab Results  Component Value Date   WBC 3.8 (L) 10/19/2021   HGB 13.4 10/19/2021   HCT 40.5 10/19/2021   MCV 92.7 10/19/2021   PLT 241 10/19/2021   Lab Results  Component Value Date   FERRITIN 24 08/29/2021   IRON 24 (L) 09/08/2021   TIBC 364 09/08/2021   UIBC 340 09/08/2021   IRONPCTSAT 7 (L) 09/08/2021   Lab Results  Component Value Date   RETICCTPCT 1.7 10/31/2020   RBC 4.37 10/19/2021   No results found for: KPAFRELGTCHN, LAMBDASER, KAPLAMBRATIO No results found for: Kandis Cocking, IGMSERUM Lab Results  Component Value Date   ALBUMINELP 3.8 09/28/2020   A1GS 0.5 (H) 09/28/2020   A2GS 0.9 09/28/2020   BETS 0.6 09/28/2020   BETA2SER 0.4 09/28/2020   GAMS 1.7 09/28/2020   SPEI  09/28/2020     Comment:     . Alpha-1 globulin increase noted. .      Chemistry      Component Value Date/Time   NA 138 09/08/2021 1421   K 4.0 09/08/2021 1421   CL 102 09/08/2021 1421   CO2 29 09/08/2021 1421   BUN 19 09/08/2021 1421   CREATININE 0.88 09/08/2021 1421   CREATININE 1.06 (H) 08/29/2021 1356   CREATININE 1.29 (H) 11/12/2020 1557      Component Value Date/Time   CALCIUM 10.0 09/08/2021 1421   ALKPHOS 55 09/08/2021 1421   AST 13 (L) 09/08/2021 1421   AST 14 (L) 08/29/2021 1356   ALT 11 09/08/2021 1421   ALT 9 08/29/2021 1356   BILITOT 0.3 09/08/2021 1421   BILITOT 0.4 08/29/2021 1356       Impression and Plan: Sydney [name] is a very pleasant 55 yo caucasian female with diagnosis of nonocclusive thrombus within the mid superior sagittal sinus extending posteriorly, left sigmoid sinus and visualized left internal jugular vein. Repeat MRI showed resolution of thrombi.  Iron studies are pending.  No changes to Pradaxa regimen at this time.  Follow-up in 6 months.   Lottie Dawson, NP 12/21/20221:29 PM

## 2021-10-20 LAB — IRON AND IRON BINDING CAPACITY (CC-WL,HP ONLY)
Iron: 63 ug/dL (ref 28–170)
Saturation Ratios: 22 % (ref 10.4–31.8)
TIBC: 281 ug/dL (ref 250–450)
UIBC: 218 ug/dL (ref 148–442)

## 2021-10-20 LAB — PROTEIN S ACTIVITY: Protein S Activity: >280 % — ABNORMAL HIGH (ref 63–140)

## 2021-10-20 LAB — PROTEIN S, TOTAL: Protein S Ag, Total: 100 % (ref 60–150)

## 2021-10-20 LAB — FERRITIN: Ferritin: 392 ng/mL — ABNORMAL HIGH (ref 11–307)

## 2021-10-21 ENCOUNTER — Other Ambulatory Visit: Payer: No Typology Code available for payment source

## 2021-10-21 ENCOUNTER — Telehealth: Payer: Self-pay | Admitting: *Deleted

## 2021-10-21 ENCOUNTER — Ambulatory Visit: Payer: No Typology Code available for payment source | Admitting: Family

## 2021-10-21 NOTE — Telephone Encounter (Signed)
Per 10/19/21 los - called and lvm of upcoming appointments - requested call back to confirm

## 2021-10-24 ENCOUNTER — Other Ambulatory Visit: Payer: Self-pay | Admitting: Family Medicine

## 2021-11-02 ENCOUNTER — Encounter: Payer: Self-pay | Admitting: Family

## 2021-11-04 ENCOUNTER — Other Ambulatory Visit: Payer: Self-pay | Admitting: Family

## 2021-11-04 DIAGNOSIS — I82C12 Acute embolism and thrombosis of left internal jugular vein: Secondary | ICD-10-CM

## 2021-11-04 DIAGNOSIS — G08 Intracranial and intraspinal phlebitis and thrombophlebitis: Secondary | ICD-10-CM

## 2021-11-04 MED ORDER — DABIGATRAN ETEXILATE MESYLATE 150 MG PO CAPS: 150.0000 mg | ORAL_CAPSULE | Freq: Two times a day (BID) | ORAL | 4 refills | Status: DC

## 2021-11-18 ENCOUNTER — Ambulatory Visit
Admission: RE | Admit: 2021-11-18 | Discharge: 2021-11-18 | Disposition: A | Discharge status: Home / Self Care | Payer: No Typology Code available for payment source | Source: Ambulatory Visit | Attending: Obstetrics and Gynecology | Admitting: Obstetrics and Gynecology

## 2021-11-18 DIAGNOSIS — Z1231 Encounter for screening mammogram for malignant neoplasm of breast: Secondary | ICD-10-CM

## 2021-11-23 ENCOUNTER — Encounter: Payer: Self-pay | Admitting: *Deleted

## 2021-12-14 ENCOUNTER — Encounter: Payer: Self-pay | Admitting: Neurology

## 2021-12-21 ENCOUNTER — Ambulatory Visit: Payer: No Typology Code available for payment source | Admitting: Neurology

## 2022-01-30 ENCOUNTER — Other Ambulatory Visit: Payer: Self-pay | Admitting: Family

## 2022-01-30 DIAGNOSIS — G08 Intracranial and intraspinal phlebitis and thrombophlebitis: Secondary | ICD-10-CM

## 2022-01-30 DIAGNOSIS — I82C12 Acute embolism and thrombosis of left internal jugular vein: Secondary | ICD-10-CM

## 2022-02-14 ENCOUNTER — Other Ambulatory Visit: Payer: Self-pay | Admitting: Pharmacist

## 2022-03-27 ENCOUNTER — Emergency Department (HOSPITAL_COMMUNITY): Payer: No Typology Code available for payment source

## 2022-03-27 ENCOUNTER — Other Ambulatory Visit: Payer: Self-pay

## 2022-03-27 ENCOUNTER — Emergency Department (HOSPITAL_COMMUNITY)
Admission: EM | Admit: 2022-03-27 | Discharge: 2022-03-27 | Discharge status: Against Medical Advice | Payer: No Typology Code available for payment source | Attending: Emergency Medicine | Admitting: Emergency Medicine

## 2022-03-27 ENCOUNTER — Encounter (HOSPITAL_COMMUNITY): Payer: Self-pay | Admitting: Emergency Medicine

## 2022-03-27 DIAGNOSIS — Y900 Blood alcohol level of less than 20 mg/100 ml: Secondary | ICD-10-CM | POA: Insufficient documentation

## 2022-03-27 DIAGNOSIS — R42 Dizziness and giddiness: Secondary | ICD-10-CM | POA: Diagnosis present

## 2022-03-27 DIAGNOSIS — R519 Headache, unspecified: Secondary | ICD-10-CM | POA: Insufficient documentation

## 2022-03-27 DIAGNOSIS — R531 Weakness: Secondary | ICD-10-CM | POA: Diagnosis not present

## 2022-03-27 DIAGNOSIS — R202 Paresthesia of skin: Secondary | ICD-10-CM | POA: Insufficient documentation

## 2022-03-27 DIAGNOSIS — Z20822 Contact with and (suspected) exposure to covid-19: Secondary | ICD-10-CM | POA: Insufficient documentation

## 2022-03-27 DIAGNOSIS — Z5329 Procedure and treatment not carried out because of patient's decision for other reasons: Secondary | ICD-10-CM | POA: Insufficient documentation

## 2022-03-27 DIAGNOSIS — R112 Nausea with vomiting, unspecified: Secondary | ICD-10-CM | POA: Diagnosis not present

## 2022-03-27 DIAGNOSIS — R29898 Other symptoms and signs involving the musculoskeletal system: Secondary | ICD-10-CM

## 2022-03-27 HISTORY — DX: Cerebral infarction, unspecified: I63.9

## 2022-03-27 LAB — RAPID URINE DRUG SCREEN, HOSP PERFORMED
Amphetamines: NOT DETECTED
Barbiturates: NOT DETECTED
Benzodiazepines: NOT DETECTED
Cocaine: NOT DETECTED
Opiates: NOT DETECTED
Tetrahydrocannabinol: NOT DETECTED

## 2022-03-27 LAB — DIFFERENTIAL
Abs Immature Granulocytes: 0.02 10*3/uL (ref 0.00–0.07)
Basophils Absolute: 0 10*3/uL (ref 0.0–0.1)
Basophils Relative: 1 %
Eosinophils Absolute: 0 10*3/uL (ref 0.0–0.5)
Eosinophils Relative: 1 %
Immature Granulocytes: 0 %
Lymphocytes Relative: 15 %
Lymphs Abs: 0.9 10*3/uL (ref 0.7–4.0)
Monocytes Absolute: 0.3 10*3/uL (ref 0.1–1.0)
Monocytes Relative: 5 %
Neutro Abs: 4.6 10*3/uL (ref 1.7–7.7)
Neutrophils Relative %: 78 %

## 2022-03-27 LAB — CBC
HCT: 40.8 % (ref 36.0–46.0)
Hemoglobin: 13.7 g/dL (ref 12.0–15.0)
MCH: 31.5 pg (ref 26.0–34.0)
MCHC: 33.6 g/dL (ref 30.0–36.0)
MCV: 93.8 fL (ref 80.0–100.0)
Platelets: 206 10*3/uL (ref 150–400)
RBC: 4.35 MIL/uL (ref 3.87–5.11)
RDW: 12.5 % (ref 11.5–15.5)
WBC: 5.9 10*3/uL (ref 4.0–10.5)
nRBC: 0 % (ref 0.0–0.2)

## 2022-03-27 LAB — RESP PANEL BY RT-PCR (FLU A&B, COVID) ARPGX2
Influenza A by PCR: NEGATIVE
Influenza B by PCR: NEGATIVE
SARS Coronavirus 2 by RT PCR: NEGATIVE

## 2022-03-27 LAB — COMPREHENSIVE METABOLIC PANEL
ALT: 33 U/L (ref 0–44)
AST: 35 U/L (ref 15–41)
Albumin: 4.1 g/dL (ref 3.5–5.0)
Alkaline Phosphatase: 83 U/L (ref 38–126)
Anion gap: 6 (ref 5–15)
BUN: 19 mg/dL (ref 6–20)
CO2: 28 mmol/L (ref 22–32)
Calcium: 9.8 mg/dL (ref 8.9–10.3)
Chloride: 105 mmol/L (ref 98–111)
Creatinine, Ser: 1.06 mg/dL — ABNORMAL HIGH (ref 0.44–1.00)
GFR, Estimated: >60 mL/min (ref >60)
Glucose, Bld: 116 mg/dL — ABNORMAL HIGH (ref 70–99)
Potassium: 4.1 mmol/L (ref 3.5–5.1)
Sodium: 139 mmol/L (ref 135–145)
Total Bilirubin: 0.5 mg/dL (ref 0.3–1.2)
Total Protein: 8.3 g/dL — ABNORMAL HIGH (ref 6.5–8.1)

## 2022-03-27 LAB — URINALYSIS, ROUTINE W REFLEX MICROSCOPIC
Bilirubin Urine: NEGATIVE
Glucose, UA: NEGATIVE mg/dL
Hgb urine dipstick: NEGATIVE
Ketones, ur: NEGATIVE mg/dL
Nitrite: NEGATIVE
Protein, ur: NEGATIVE mg/dL
Specific Gravity, Urine: 1.013 (ref 1.005–1.030)
pH: 5 (ref 5.0–8.0)

## 2022-03-27 LAB — I-STAT CHEM 8, ED
BUN: 21 mg/dL — ABNORMAL HIGH (ref 6–20)
Calcium, Ion: 1.17 mmol/L (ref 1.15–1.40)
Chloride: 102 mmol/L (ref 98–111)
Creatinine, Ser: 1 mg/dL (ref 0.44–1.00)
Glucose, Bld: 111 mg/dL — ABNORMAL HIGH (ref 70–99)
HCT: 42 % (ref 36.0–46.0)
Hemoglobin: 14.3 g/dL (ref 12.0–15.0)
Potassium: 3.9 mmol/L (ref 3.5–5.1)
Sodium: 139 mmol/L (ref 135–145)
TCO2: 27 mmol/L (ref 22–32)

## 2022-03-27 LAB — I-STAT BETA HCG BLOOD, ED (MC, WL, AP ONLY): I-stat hCG, quantitative: <5 m[IU]/mL (ref <5)

## 2022-03-27 LAB — APTT: aPTT: 47 seconds — ABNORMAL HIGH (ref 24–36)

## 2022-03-27 LAB — ETHANOL: Alcohol, Ethyl (B): <10 mg/dL (ref <10)

## 2022-03-27 LAB — PROTIME-INR
INR: 1.4 — ABNORMAL HIGH (ref 0.8–1.2)
Prothrombin Time: 17.3 seconds — ABNORMAL HIGH (ref 11.4–15.2)

## 2022-03-27 IMAGING — CT CT HEAD W/O CM
4 series · 17 of 47 positions shown, 19 images · non-contrast
Comparison: MRI examination dated [DATE] and CT
examination dated [DATE]

CLINICAL DATA: Transient ischemic attack.



[Series 2: head wo · axial · 0.40mm/px · z∈[+1431,+1541]mm · 7 of 30 slices shown, 9 images]
[im 4/30  brain]
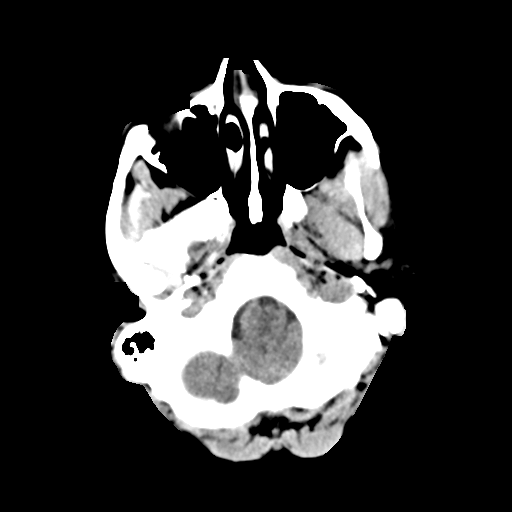
[im 4/30  bone]
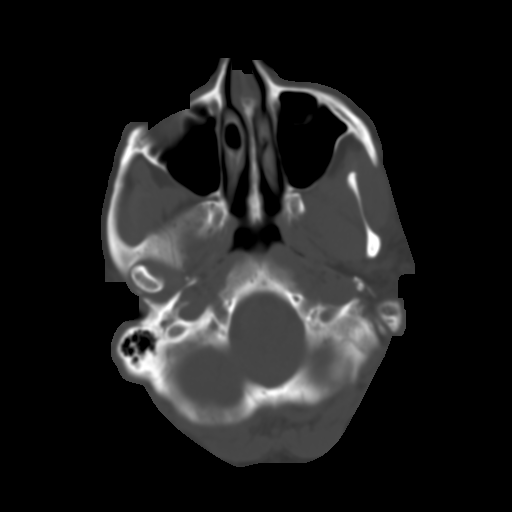
[im 8/30  brain]
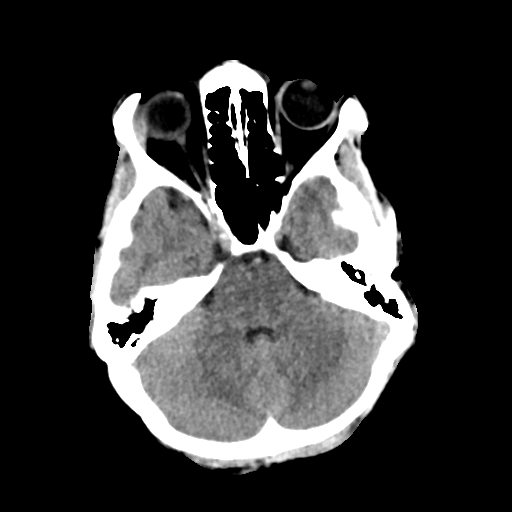
[im 11/30  brain]
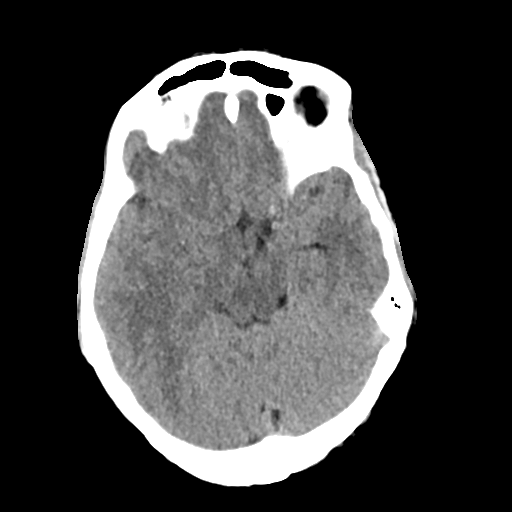
[im 15/30  brain]
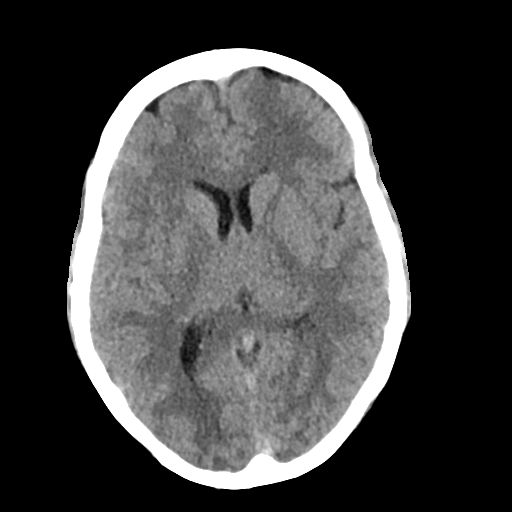
[im 19/30  brain]
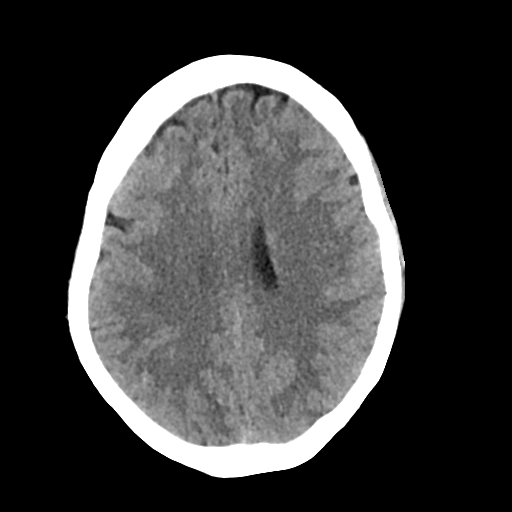
[im 19/30  bone]
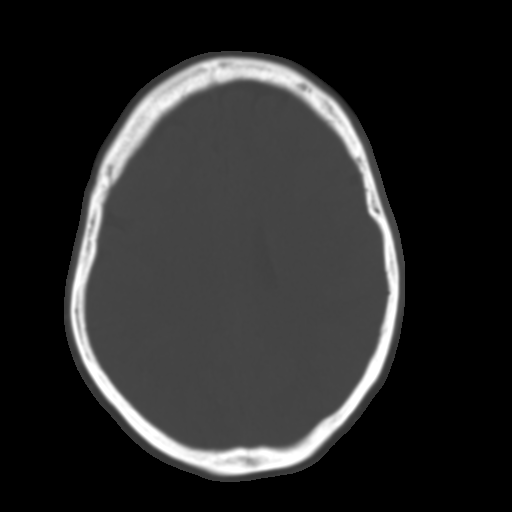
[im 22/30  brain]
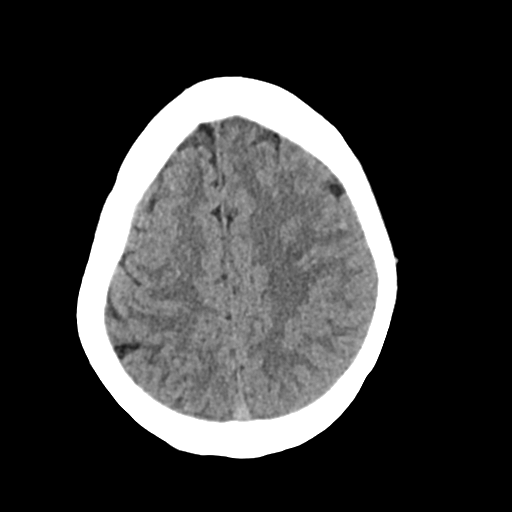
[im 26/30  brain]
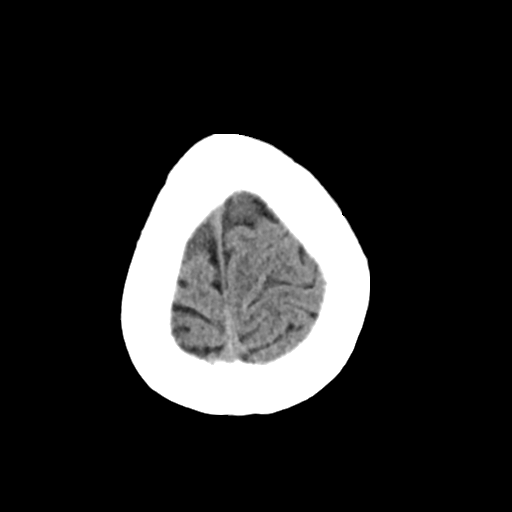

[Series 3: head bone · axial · 0.40mm/px · z∈[+1430,+1482]mm · 4 of 75 slices shown]
[im 8/75  bone]
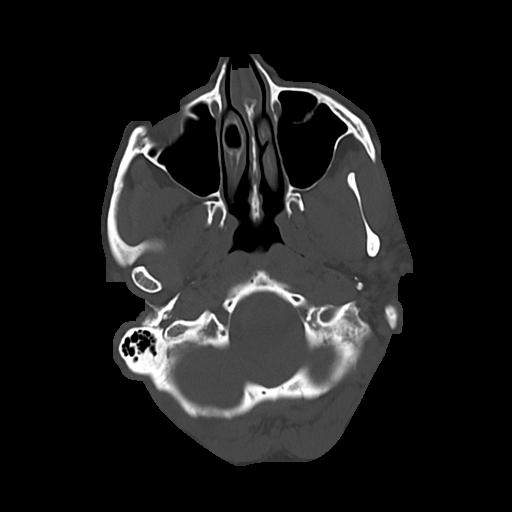
[im 15/75  bone]
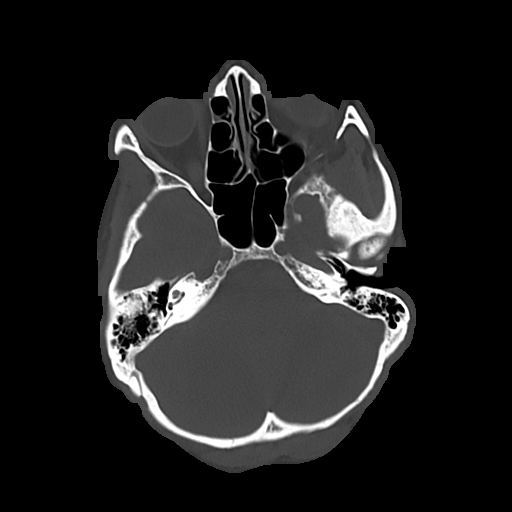
[im 23/75  bone]
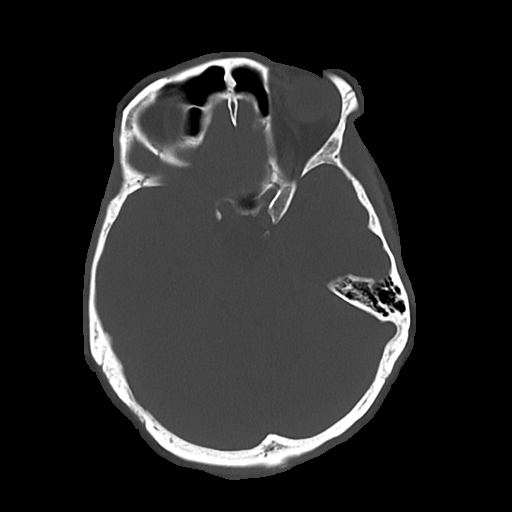
[im 34/75  bone]
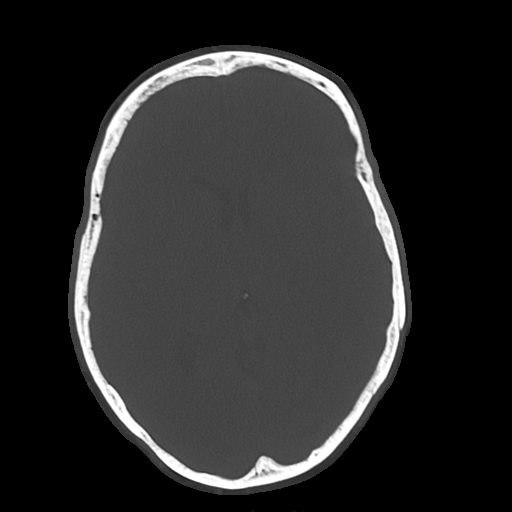

[Series 4: cor soft · coronal · 0.29mm/px · 3 of 65 slices shown]
[im 22/65  brain]
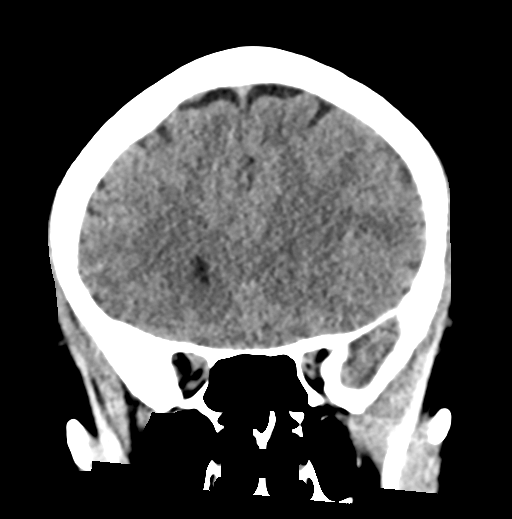
[im 29/65  brain]
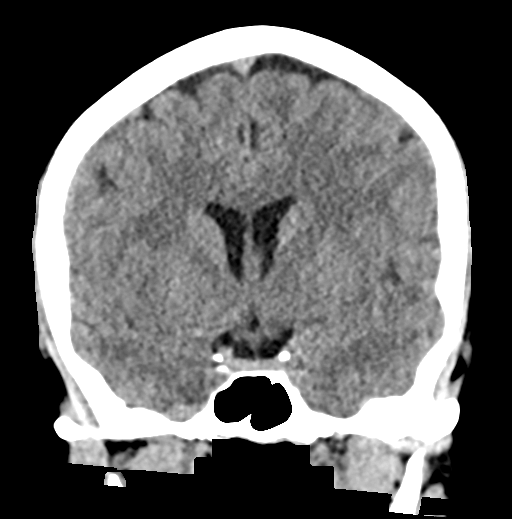
[im 36/65  brain]
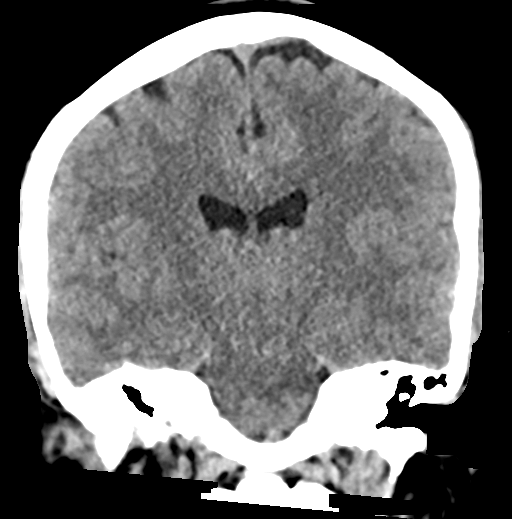

[Series 5: sag soft · sagittal · 0.30mm/px · 3 of 51 slices shown]
[im 17/51  brain]
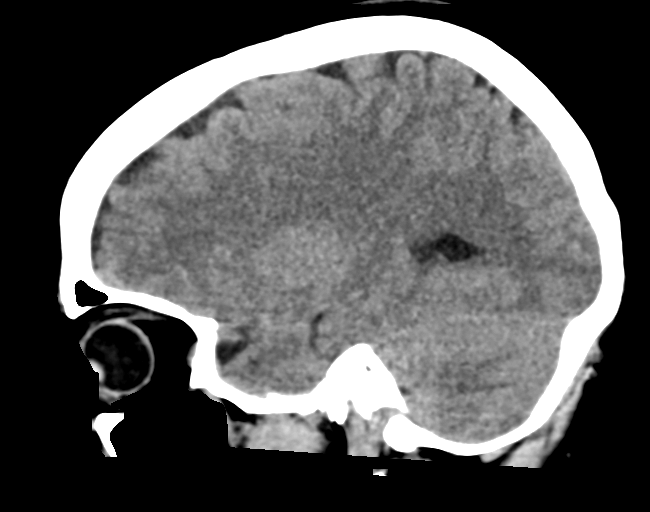
[im 26/51  brain]
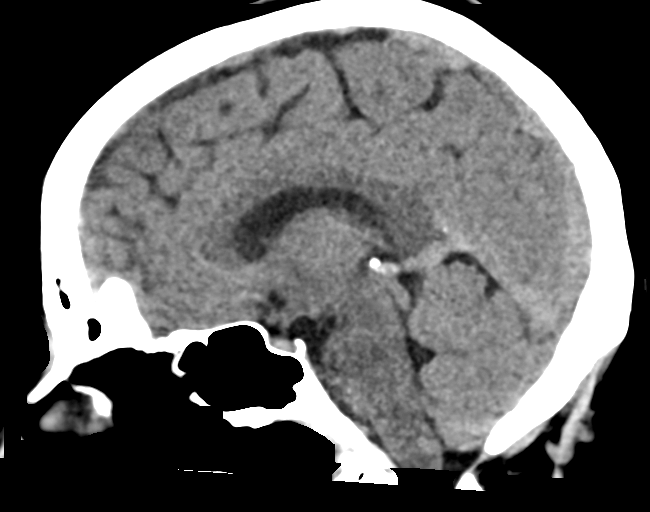
[im 34/51  brain]
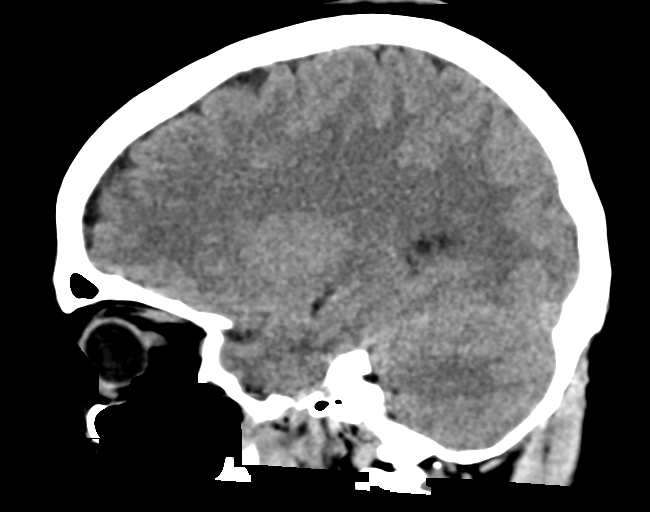

[17 of 47 positions shown; findings below may reference images not displayed]

FINDINGS: Brain: No evidence of acute infarction, hemorrhage, hydrocephalus,
extra-axial collection or mass lesion/mass effect.

Vascular: No hyperdense vessel or unexpected calcification.

Skull: Normal. Negative for fracture or focal lesion.

Sinuses/Orbits: No acute finding.

Other: None.
IMPRESSION: No acute intracranial abnormality.

## 2022-03-27 MED ORDER — METOCLOPRAMIDE HCL 5 MG/ML IJ SOLN
10.0000 mg | Freq: Once | INTRAMUSCULAR | Status: AC
  Administered 2022-03-27: 10 mg via INTRAVENOUS
  Filled 2022-03-27: qty 2

## 2022-03-27 MED ORDER — SODIUM CHLORIDE 0.9 % IV BOLUS
1000.0000 mL | Freq: Once | INTRAVENOUS | Status: AC
  Administered 2022-03-27: 1000 mL via INTRAVENOUS

## 2022-03-27 MED ORDER — DIPHENHYDRAMINE HCL 25 MG PO CAPS
25.0000 mg | ORAL_CAPSULE | Freq: Once | ORAL | Status: AC
  Administered 2022-03-27: 25 mg via ORAL
  Filled 2022-03-27: qty 1

## 2022-03-27 NOTE — ED Triage Notes (Signed)
Patient BIB [facility]. States she was walking her dog and had sudden onset of dizziness, nausea and headache. Pt with a history of stroke in 2021. Symptoms were the same then. VSS. NAD.

## 2022-03-27 NOTE — Discharge Instructions (Addendum)
You have chosen to leave AGAINST MEDICAL ADVICE. Should you change your mind, you are always welcome and encouraged to return to the ED. You are encouraged to follow-up with, at the very least, a primary care provider, or other similar medical professional on this matter.  

## 2022-03-27 NOTE — ED Provider Notes (Signed)
[facility] EMERGENCY DEPARTMENT Provider Note   CSN: [id number] Arrival date & time: 03/27/22  1538     History  Chief Complaint  Patient presents with   Dizziness   Nausea    Sydney [name] is a 56 y.o. female.   Dizziness  Patient is a 56 year old female with past medical history discussed below   She is presented emergency room today with complaints of "feeling off ", dizziness, headache  She states that around 930 this morning was when she was working out and felt different than she normally does.  She states that she finished the class began feeling nauseous around 2 PM when she was walking in the park.  She started vomiting after that she states that she then developed some vertiginous symptoms that was around 145/2 PM The emesis was nonbloody nonbilious.  She denies any chest pain or difficulty breathing.   She describes the headache as pressure and more frontal/circumferential.    Home Medications Prior to Admission medications   Medication Sig Start Date End Date Taking? Authorizing Provider  Biotin 5 MG TABS Take 5 mg by mouth at bedtime. Chewable    [provider]  butalbital-acetaminophen-caffeine (FIORICET) 50-325-40 MG tablet Take 1 tablet by mouth every 6 (six) hours as needed for headache. Patient not taking: Reported on 09/02/2021 08/18/21   Garvin Fila, MD  Caffeine 100 MG TABS Take 2 tablets (200 mg total) by mouth 2 (two) times daily. Patient not taking: Reported on 09/02/2021 08/31/21   Howard Pouch A, DO  magnesium oxide (MAG-OX) 400 MG tablet Take 800 mg by mouth daily.    [provider]  Multiple Vitamin (MULTIVITAMIN) tablet Take 1 tablet by mouth daily. Patient not taking: Reported on 10/19/2021    [provider]  nortriptyline (PAMELOR) 10 MG capsule TAKE 1-2 CAPSULES (10-20 MG TOTAL) BY MOUTH AT BEDTIME. Patient not taking: Reported on 10/19/2021 09/16/21   Howard Pouch A, DO   ondansetron (ZOFRAN) 4 MG tablet Take 1 tablet (4 mg total) by mouth every 8 (eight) hours as needed for nausea or vomiting. Patient not taking: Reported on 10/19/2021 08/25/21   Howard Pouch A, DO  PRADAXA 150 MG CAPS capsule TAKE 1 CAPSULE BY MOUTH TWICE A DAY 01/30/22   Volanda Napoleon, MD  predniSONE (DELTASONE) 20 MG tablet 60 mg x3d, 40 mg x3d, 20 mg x2d, 10 mg x2d Patient not taking: Reported on 10/19/2021 09/02/21   Raoul Pitch, Renee A, DO  valACYclovir (VALTREX) 500 MG tablet Take 500 mg by mouth 2 (two) times daily. 08/19/21   [provider]      Allergies    Covid-19 (mrna) vaccine and Doxycycline    Review of Systems   Review of Systems  Neurological:  Positive for dizziness.   Physical Exam Updated Vital Signs BP (!) 144/84   Pulse (!) 59   Temp 98 F (36.7 C) (Oral)   Resp 15   SpO2 100%  Physical Exam Vitals and nursing note reviewed.  Constitutional:      General: She is not in acute distress. HENT:     Head: Normocephalic and atraumatic.     Nose: Nose normal.  Eyes:     General: No scleral icterus. Cardiovascular:     Rate and Rhythm: Normal rate and regular rhythm.     Pulses: Normal pulses.     Heart sounds: Normal heart sounds.  Pulmonary:     Effort: Pulmonary effort is normal. No  respiratory distress.     Breath sounds: No wheezing.  Abdominal:     Palpations: Abdomen is soft.     Tenderness: There is no abdominal tenderness. There is no guarding or rebound.  Musculoskeletal:     Cervical back: Normal range of motion.     Right lower leg: No edema.     Left lower leg: No edema.  Skin:    General: Skin is warm and dry.     Capillary Refill: Capillary refill takes less than 2 seconds.  Neurological:     Mental Status: She is alert. Mental status is at baseline.     Comments: Alert and oriented to self, place, time and event.   Speech is fluent, clear without dysarthria or dysphasia.   Strength 5/5 in upper/lower extremities apart from  extension left elbow which seems to be 4/5 however this is inconsistently weak on my examination.  Sometimes 5/5 and sometimes 4/5. Sensation intact in upper/lower extremities   Negative Romberg. No pronator drift.  Normal finger-to-nose and feet tapping.  CN I not tested  CN II grossly intact visual fields bilaterally. Did not visualize posterior eye.  CN III, IV, VI PERRLA and EOMs intact bilaterally  CN V Intact sensation to sharp and light touch to the face  CN VII facial movements symmetric  CN VIII not tested  CN IX, X no uvula deviation, symmetric rise of soft palate  CN XI 5/5 SCM and trapezius strength bilaterally  CN XII Midline tongue protrusion, symmetric L/R movements     Psychiatric:        Mood and Affect: Mood normal.        Behavior: Behavior normal.    ED Results / Procedures / Treatments   Labs (all labs ordered are listed, but only abnormal results are displayed) Labs Reviewed  PROTIME-INR - Abnormal; Notable for the following components:      Result Value   Prothrombin Time 17.3 (*)    INR 1.4 (*)    All other components within normal limits  APTT - Abnormal; Notable for the following components:   aPTT 47 (*)    All other components within normal limits  COMPREHENSIVE METABOLIC PANEL - Abnormal; Notable for the following components:   Glucose, Bld 116 (*)    Creatinine, Ser 1.06 (*)    Total Protein 8.3 (*)    All other components within normal limits  URINALYSIS, ROUTINE W REFLEX MICROSCOPIC - Abnormal; Notable for the following components:   APPearance HAZY (*)    Leukocytes,Ua MODERATE (*)    Bacteria, UA RARE (*)    All other components within normal limits  I-STAT CHEM 8, ED - Abnormal; Notable for the following components:   BUN 21 (*)    Glucose, Bld 111 (*)    All other components within normal limits  RESP PANEL BY RT-PCR (FLU A&B, COVID) ARPGX2  ETHANOL  CBC  DIFFERENTIAL  RAPID URINE DRUG SCREEN, HOSP PERFORMED  I-STAT BETA HCG  BLOOD, ED (MC, WL, AP ONLY)    EKG None  Radiology CT HEAD WO CONTRAST  Result Date: 03/27/2022 CLINICAL DATA:  Transient ischemic attack. EXAM: CT HEAD WITHOUT CONTRAST TECHNIQUE: Contiguous axial images were obtained from the base of the skull through the vertex without intravenous contrast. RADIATION DOSE REDUCTION: This exam was performed according to the departmental dose-optimization program which includes automated exposure control, adjustment of the mA and/or kV according to patient size and/or use of iterative reconstruction technique. COMPARISON:  MRI examination dated August 23, 2021 and CT examination dated August 05, 2021 FINDINGS: Brain: No evidence of acute infarction, hemorrhage, hydrocephalus, extra-axial collection or mass lesion/mass effect. Vascular: No hyperdense vessel or unexpected calcification. Skull: Normal. Negative for fracture or focal lesion. Sinuses/Orbits: No acute finding. Other: None. IMPRESSION: No acute intracranial abnormality. Electronically Signed   By: Keane Police D.O.   On: 03/27/2022 16:45    Procedures Procedures    Medications Ordered in ED Medications  metoCLOPramide (REGLAN) injection 10 mg (10 mg Intravenous Given 03/27/22 1719)  diphenhydrAMINE (BENADRYL) capsule 25 mg (25 mg Oral Given 03/27/22 1720)  sodium chloride 0.9 % bolus 1,000 mL (0 mLs Intravenous Stopped 03/27/22 1753)    ED Course/ Medical Decision Making/ A&P Clinical Course as of 03/27/22 1755  Mon Mar 27, 2022  1628 9:30 am felt "off" but finished the class and felt nauseated around 2pm. Vomiting frequently NBNB. LH+.   [WF]  1655 Discussed with Dr. Malen Gauze of neurology particularly because she is having left arm weakness that this is a seem to be somewhat irregularly present on my exam.  Her head CT was negative Dr. Marella Bile reviewed this.  Recommended MRV, MRI brain without and headache cocktail. [WF]  1712 I discussed this case with my attending physician who cosigned this note  including patient's presenting symptoms, physical exam, and planned diagnostics and interventions. Attending physician stated agreement with plan or made changes to plan which were implemented.   Attending physician assessed patient at bedside.   [WF]    Clinical Course User Index [WF] Tedd Sias, [place]                           Medical Decision Making  This patient presents to the ED for concern of headache, left arm weakness, some paresthesias of the left upper extremity, this involves a number of treatment options, and is a complaint that carries with it a high risk of complications and morbidity.  The differential diagnosis includes stroke, MS, cerebral venous thrombosis, intracranial hemorrhage.   Co morbidities: Discussed in HPI   Brief History:  Patient is a 56 year old female with past medical history discussed below   She is presented emergency room today with complaints of "feeling off ", dizziness, headache  She states that around 930 this morning was when she was working out and felt different than she normally does.  She states that she finished the class began feeling nauseous around 2 PM when she was walking in the park.  She started vomiting after that she states that she then developed some vertiginous symptoms that was around 145/2 PM The emesis was nonbloody nonbilious.  She denies any chest pain or difficulty breathing.   She describes the headache as pressure and more frontal/circumferential.   Physical exam with normal neurologic exam apart from some intermittent left arm weakness with extension of the left elbow.  Given his abnormal exam and the fact that she is within the stroke window discussed with Dr. Malen Gauze of neurology.  See clinical course/ED course for specifics.  He recommended MRI brain and MRV with and without contrast.   EMR reviewed including pt PMHx, past surgical history and past visits to ER.   See HPI for more details   Lab  Tests:   I ordered and independently interpreted labs. Labs notable for marginal elevation of INR otherwise unremarkable.   Imaging Studies:  NAD. I personally reviewed all imaging studies and  no acute abnormality found. I agree with radiology interpretation.  CT head specifically without any bleed.  Reviewed by Dr. Malen Gauze and myself    Cardiac Monitoring:  The patient was maintained on a cardiac monitor.  I personally viewed and interpreted the cardiac monitored which showed an underlying rhythm of: NSR EKG non-ischemic   Medicines ordered:  I ordered medication including Reglan Benadryl and 1 L normal saline for headache Reevaluation of the patient after these medicines showed that the patient resolved I have reviewed the patients home medicines and have made adjustments as needed   Critical Interventions:     Consults/Attending Physician   Discussed with Dr. Malen Gauze of neurology see ED course for specifics  I discussed this case with my attending physician who cosigned this note including patient's presenting symptoms, physical exam, and planned diagnostics and interventions. Attending physician stated agreement with plan or made changes to plan which were implemented.   Attending physician assessed patient at bedside.    Reevaluation:  After the interventions noted above I re-evaluated patient and found that they have :resolved   Social Determinants of Health:      Problem List / ED Course:  Patient presents with vertigo, left arm weakness and paresthesias.  Discussion with neurology was had and recommendation was made for MRV with and without contrast and MRI.  Brain. On my reevaluation patient after Reglan Benadryl and 1 L normal saline her symptoms have completely resolved.  She states that she would like to be discharged.  I informed her that I would need to complete the imaging studies that we have discussed.  She states she does not want to have contrast  administered to her and she is worried about toxins.  She also states that her symptoms have now resolved.  She understands that permanent disability and death can result from misdiagnosis of the considered causes of her symptoms.  She does not appear inebriated and she is clinically oriented and seems to be capable of making decisions for herself.  Will allow patient to sign out AMA at this time.   Dispostion:        Patient wishes to leave [place]. I personally explained need for further testing and my concerns for adverse outcomes if workup is incomplete. Specific concerns explained to patient include worsening symptoms, functional loss, long term sickness and death. Patient states understanding of risks and states they will return if they feel the need to at a later date to receive the recommended care or any other care at any time, regardless of their ability to pay for such care. Patient understands they are able to return at any time. Patient is able to explain back the risks of leaving AMA and still wishes to leave.   Specifically I recommend staying for MRI to evaluate and exclude cerebral venous thrombosis or stroke.  The patient is oriented to person, place, and time, has the capacity to make decisions regarding the medical care offered. The patient speaks coherently and exhibits no evidence of having an altered level of consciousness or alcohol or drug intoxication to a point that would impair judgment. They respond knowingly to questions about recommended treatment and alternate treatments including no further testing or treatment; participate in diagnostic and treatment decisions by means of rational thought processes; and understand the items of minimum basic medical treatment information with respect to that treatment (the nature and seriousness of the illness, the nature of the treatment, the probable degree and duration of  any benefits and risks of any medical  intervention that is being recommended, and the consequences of lack of treatment, and the nature, risks, and benefits of any reasonable alternatives).  The patient understands the relevant information of the nature of their medical condition, as well as the risks, benefits, and treatment alternatives (including non-treatment), consequences of refusing care, and can competently communicate a rational explanation about their choice of care options.    Included in AVS was the following message:  You have chosen to leave [place]. Should you change your mind, you are always welcome and encouraged to return to the ED. You are encouraged to follow-up with, at the very least, a primary care provider, or other similar medical professional on this matter.   Final Clinical Impression(s) / ED Diagnoses Final diagnoses:  Dizziness  Left arm weakness    Rx / DC Orders ED Discharge Orders     None         Tedd Sias, [place] 03/27/22 1802    Lajean Saver, MD 03/28/22 2133

## 2022-03-27 NOTE — ED Provider Triage Note (Cosign Needed)
Emergency Medicine Provider Triage Evaluation Note  Sydney [name] , a 56 y.o. female  was evaluated in triage.  Pt complains of dizziness, nausea, vomiting. H/o clots in brain and jugular from COVID vaccine in 2021 leading to stroke.  LKW around 0830 today when the nausea and "just not feeling well" started, dizziness episode with nausea and vomiting happened around 1500 today. On Pradaxa.  '4mg'$  of Zofran with EMS.  Review of Systems  Positive:  Negative:   Physical Exam  BP 122/75   Pulse 64   Temp 98 F (36.7 C) (Oral)   Resp 16   SpO2 98%  Gen:   Awake, no distress   Resp:  Normal effort  MSK:   Moves extremities without difficulty  Other:  Cranial nerves II-XII intact. Sensation intact throughout. Finger to nose. Answering questions appropriately with appropriate speech. No facial droop noted.   Medical Decision Making  Medically screening exam initiated at 3:54 PM.  Appropriate orders placed.  [place] was informed that the remainder of the evaluation will be completed by another provider, this initial triage assessment does not replace that evaluation, and the importance of remaining in the ED until their evaluation is complete.  Given the patient's h/o stroke, will order code stroke order set without the code as the patient has a benign neuro exam.    Sherrell Puller, PA-C 03/27/22 1605

## 2022-03-27 NOTE — ED Notes (Signed)
Pt leaving AMA. PA notified.

## 2022-03-27 NOTE — ED Notes (Signed)
Pt transported to CT at this time.

## 2022-03-27 NOTE — ED Notes (Signed)
Called CT to notify of stat CT order. CT sending for pt now.

## 2022-04-19 ENCOUNTER — Encounter: Payer: Self-pay | Admitting: Hematology & Oncology

## 2022-04-19 ENCOUNTER — Inpatient Hospital Stay: Payer: No Typology Code available for payment source | Attending: Hematology & Oncology

## 2022-04-19 ENCOUNTER — Inpatient Hospital Stay (HOSPITAL_BASED_OUTPATIENT_CLINIC_OR_DEPARTMENT_OTHER): Payer: No Typology Code available for payment source | Admitting: Hematology & Oncology

## 2022-04-19 ENCOUNTER — Other Ambulatory Visit: Payer: Self-pay

## 2022-04-19 VITALS — BP 129/71 | HR 57 | Temp 98.0°F | Resp 18 | Ht 62.0 in | Wt 145.1 lb

## 2022-04-19 DIAGNOSIS — R42 Dizziness and giddiness: Secondary | ICD-10-CM | POA: Insufficient documentation

## 2022-04-19 DIAGNOSIS — G08 Intracranial and intraspinal phlebitis and thrombophlebitis: Secondary | ICD-10-CM | POA: Diagnosis present

## 2022-04-19 DIAGNOSIS — Z79899 Other long term (current) drug therapy: Secondary | ICD-10-CM | POA: Diagnosis not present

## 2022-04-19 DIAGNOSIS — D6859 Other primary thrombophilia: Secondary | ICD-10-CM

## 2022-04-19 DIAGNOSIS — I82C12 Acute embolism and thrombosis of left internal jugular vein: Secondary | ICD-10-CM

## 2022-04-19 DIAGNOSIS — Z7901 Long term (current) use of anticoagulants: Secondary | ICD-10-CM | POA: Diagnosis not present

## 2022-04-19 DIAGNOSIS — D509 Iron deficiency anemia, unspecified: Secondary | ICD-10-CM | POA: Insufficient documentation

## 2022-04-19 LAB — CMP (CANCER CENTER ONLY)
ALT: 13 U/L (ref 0–44)
AST: 22 U/L (ref 15–41)
Albumin: 4.5 g/dL (ref 3.5–5.0)
Alkaline Phosphatase: 77 U/L (ref 38–126)
Anion gap: 6 (ref 5–15)
BUN: 19 mg/dL (ref 6–20)
CO2: 28 mmol/L (ref 22–32)
Calcium: 10.1 mg/dL (ref 8.9–10.3)
Chloride: 102 mmol/L (ref 98–111)
Creatinine: 1.11 mg/dL — ABNORMAL HIGH (ref 0.44–1.00)
GFR, Estimated: 59 mL/min — ABNORMAL LOW (ref >60)
Glucose, Bld: 92 mg/dL (ref 70–99)
Potassium: 3.9 mmol/L (ref 3.5–5.1)
Sodium: 136 mmol/L (ref 135–145)
Total Bilirubin: 0.5 mg/dL (ref 0.3–1.2)
Total Protein: 8.6 g/dL — ABNORMAL HIGH (ref 6.5–8.1)

## 2022-04-19 LAB — CBC WITH DIFFERENTIAL (CANCER CENTER ONLY)
Abs Immature Granulocytes: 0 10*3/uL (ref 0.00–0.07)
Basophils Absolute: 0 10*3/uL (ref 0.0–0.1)
Basophils Relative: 1 %
Eosinophils Absolute: 0 10*3/uL (ref 0.0–0.5)
Eosinophils Relative: 1 %
HCT: 40.8 % (ref 36.0–46.0)
Hemoglobin: 13.5 g/dL (ref 12.0–15.0)
Immature Granulocytes: 0 %
Lymphocytes Relative: 29 %
Lymphs Abs: 1 10*3/uL (ref 0.7–4.0)
MCH: 31 pg (ref 26.0–34.0)
MCHC: 33.1 g/dL (ref 30.0–36.0)
MCV: 93.8 fL (ref 80.0–100.0)
Monocytes Absolute: 0.3 10*3/uL (ref 0.1–1.0)
Monocytes Relative: 8 %
Neutro Abs: 2.1 10*3/uL (ref 1.7–7.7)
Neutrophils Relative %: 61 %
Platelet Count: 201 10*3/uL (ref 150–400)
RBC: 4.35 MIL/uL (ref 3.87–5.11)
RDW: 12.4 % (ref 11.5–15.5)
WBC Count: 3.5 10*3/uL — ABNORMAL LOW (ref 4.0–10.5)
nRBC: 0 % (ref 0.0–0.2)

## 2022-04-19 LAB — IRON AND IRON BINDING CAPACITY (CC-WL,HP ONLY)
Iron: 80 ug/dL (ref 28–170)
Saturation Ratios: 29 % (ref 10.4–31.8)
TIBC: 274 ug/dL (ref 250–450)
UIBC: 194 ug/dL (ref 148–442)

## 2022-04-19 LAB — FERRITIN: Ferritin: 110 ng/mL (ref 11–307)

## 2022-04-19 LAB — LACTATE DEHYDROGENASE: LDH: 134 U/L (ref 98–192)

## 2022-04-20 ENCOUNTER — Encounter: Payer: Self-pay | Admitting: Family

## 2022-04-20 LAB — PROTEIN S ACTIVITY: Protein S Activity: 90 % (ref 63–140)

## 2022-04-20 LAB — PROTEIN S, TOTAL: Protein S Ag, Total: 115 % (ref 60–150)

## 2022-04-20 NOTE — Progress Notes (Signed)
Hematology and Oncology Follow Up Visit  Sydney [name] [id number] Jul 02, 1966 56 y.o. 04/20/2022   Principle Diagnosis:  Cerebral venous sinus thrombosis  Chronic left jugular thombus Iron deficiency anemia    Current Therapy:        Pradaxa 150 mg PO BID IV iron as indicated    Interim History:  [name] is here today for follow-up.  We last saw her back in December.  Apparently, she has been having some problems recently.  She apparently has been trying to work on exercise.  She seems to have an issue where she I think passed out.  I know she has had problems with respect to toxicity from vaccines.  She has been getting some complementary medications for this.  She is still getting problems with dizziness.  She says that this blood clot that she has in the left neck/sinus is the problem.  I think we can have to get a MRA of her brain to see exactly what is going on with the thrombus.  She is on Pradaxa.  She needs to continue Pradaxa.  She is also on baby aspirin which I think is also appropriate for her.  When we last saw her, we did check a Protein S level on her.  There is certain was no deficiency.  Her functional level was 280.  Her total Protein S was 100.  She is still working.  I  think she works at [employer].  She just is not able to work out which she likes to do.  Again I just am not sure as to what the actual reason for this might be.  She has had some headaches.  There is no obvious visual changes.  I think she may have some vertigo on occasion.  There has been no problems with bowels or bladder.  She has no incontinence.  She has no cough or shortness of breath.  Currently, I would have to say that her performance status is probably ECOG 1.   Medications:  Allergies as of 04/19/2022       Reactions   Covid-19 (mrna) Vaccine Anaphylaxis   BLOOD CLOTS   Doxycycline Nausea And Vomiting        Medication List        Accurate as of April 19, 2022 11:59 PM. If you have any questions, ask your nurse or doctor.          STOP taking these medications    butalbital-acetaminophen-caffeine 50-325-40 MG tablet Commonly known as: FIORICET Stopped by: Volanda Napoleon, MD   Caffeine 100 MG Tabs Stopped by: Volanda Napoleon, MD   magnesium oxide 400 MG tablet Commonly known as: MAG-OX Stopped by: Volanda Napoleon, MD   nortriptyline 10 MG capsule Commonly known as: PAMELOR Stopped by: Volanda Napoleon, MD   ondansetron 4 MG tablet Commonly known as: ZOFRAN Stopped by: Volanda Napoleon, MD   predniSONE 20 MG tablet Commonly known as: DELTASONE Stopped by: Volanda Napoleon, MD       TAKE these medications    Biotin 5 MG Tabs Take 5 mg by mouth at bedtime. Chewable   multivitamin tablet Take 1 tablet by mouth daily.   Pradaxa 150 MG Caps capsule Generic drug: dabigatran TAKE 1 CAPSULE BY MOUTH TWICE A DAY   valACYclovir 500 MG tablet Commonly known as: VALTREX Take 500 mg by mouth 2 (two) times daily.        Allergies:  Allergies  Allergen Reactions   Covid-19 (Mrna) Vaccine Anaphylaxis    BLOOD CLOTS   Doxycycline Nausea And Vomiting    Past Medical History, Surgical history, Social history, and Family History were reviewed and updated.  Review of Systems: Review of Systems  Constitutional:  Positive for malaise/fatigue.  HENT: Negative.    Eyes: Negative.   Respiratory: Negative.    Cardiovascular: Negative.   Gastrointestinal: Negative.   Genitourinary: Negative.   Musculoskeletal: Negative.   Skin: Negative.   Neurological:  Positive for dizziness and focal weakness.  Endo/Heme/Allergies: Negative.      Physical Exam:  height is '5\' 2"'$  (1.575 m) and weight is 145 lb 1.9 oz (65.8 kg). Her oral temperature is 98 F (36.7 C). Her blood pressure is 129/71 and her pulse is 57 (abnormal). Her respiration is 18 and oxygen saturation is 100%.   Wt Readings from Last 3 Encounters:  04/19/22  145 lb 1.9 oz (65.8 kg)  10/19/21 128 lb 1.3 oz (58.1 kg)  08/29/21 126 lb (57.2 kg)    Physical Exam Vitals reviewed.  HENT:     Head: Normocephalic and atraumatic.  Eyes:     Pupils: Pupils are equal, round, and reactive to light.  Cardiovascular:     Rate and Rhythm: Normal rate and regular rhythm.     Heart sounds: Normal heart sounds.  Pulmonary:     Effort: Pulmonary effort is normal.     Breath sounds: Normal breath sounds.  Abdominal:     General: Bowel sounds are normal.     Palpations: Abdomen is soft.  Musculoskeletal:        General: No tenderness or deformity. Normal range of motion.     Cervical back: Normal range of motion.  Lymphadenopathy:     Cervical: No cervical adenopathy.  Skin:    General: Skin is warm and dry.     Findings: No erythema or rash.  Neurological:     Mental Status: She is alert and oriented to person, place, and time.  Psychiatric:        Behavior: Behavior normal.        Thought Content: Thought content normal.        Judgment: Judgment normal.      Lab Results  Component Value Date   WBC 3.5 (L) 04/19/2022   HGB 13.5 04/19/2022   HCT 40.8 04/19/2022   MCV 93.8 04/19/2022   PLT 201 04/19/2022   Lab Results  Component Value Date   FERRITIN 110 04/19/2022   IRON 80 04/19/2022   TIBC 274 04/19/2022   UIBC 194 04/19/2022   IRONPCTSAT 29 04/19/2022   Lab Results  Component Value Date   RETICCTPCT 1.7 10/31/2020   RBC 4.35 04/19/2022   No results found for: "KPAFRELGTCHN", "LAMBDASER", "KAPLAMBRATIO" No results found for: "IGGSERUM", "IGA", "IGMSERUM" Lab Results  Component Value Date   ALBUMINELP 3.8 09/28/2020   A1GS 0.5 (H) 09/28/2020   A2GS 0.9 09/28/2020   BETS 0.6 09/28/2020   BETA2SER 0.4 09/28/2020   GAMS 1.7 09/28/2020   SPEI  09/28/2020     Comment:     . Alpha-1 globulin increase noted. .      Chemistry      Component Value Date/Time   NA 136 04/19/2022 0927   K 3.9 04/19/2022 0927   CL 102  04/19/2022 0927   CO2 28 04/19/2022 0927   BUN 19 04/19/2022 0927   CREATININE 1.11 (H) 04/19/2022 0927   CREATININE 1.29 (H) 11/12/2020  1557      Component Value Date/Time   CALCIUM 10.1 04/19/2022 0927   ALKPHOS 77 04/19/2022 0927   AST 22 04/19/2022 0927   ALT 13 04/19/2022 0927   BILITOT 0.5 04/19/2022 4356       Impression and Plan: Ms. Vogl is a very pleasant 56 yo caucasian female with diagnosis of nonocclusive thrombus within the mid superior sagittal sinus extending posteriorly, left sigmoid sinus and visualized left internal jugular vein.   Again, I am not sure exactly why she have these symptoms.  Again I would be surprised about the thrombus.  I would like to think that the thrombus is resolved.  Again we will have to check this.  We will do another  MRA.  We will we will see what this shows.  I am not sure how else we can really help her out.  I just feel bad that she is having all of these problems.  I do not know if there is anything that might be rheumatologic that could be going on.  She will continue the Pradaxa.  Again I do not think there is a problem with her being on low-dose aspirin.  I think were probably going to have to follow her up a little more closely.  I would like to probably see her back in about 6 weeks or so.  Again, she has these issues.  Hopefully we can help her out so that she can have a better quality of life.  Volanda Napoleon, MD 6/22/202311:46 AM

## 2022-04-28 ENCOUNTER — Other Ambulatory Visit: Payer: Self-pay

## 2022-04-28 DIAGNOSIS — G08 Intracranial and intraspinal phlebitis and thrombophlebitis: Secondary | ICD-10-CM

## 2022-04-30 ENCOUNTER — Ambulatory Visit (HOSPITAL_COMMUNITY)
Admission: RE | Admit: 2022-04-30 | Discharge: 2022-04-30 | Disposition: A | Discharge status: Home / Self Care | Payer: No Typology Code available for payment source | Source: Ambulatory Visit | Attending: Hematology & Oncology | Admitting: Hematology & Oncology

## 2022-04-30 ENCOUNTER — Other Ambulatory Visit: Payer: Self-pay | Admitting: Hematology & Oncology

## 2022-04-30 DIAGNOSIS — G08 Intracranial and intraspinal phlebitis and thrombophlebitis: Secondary | ICD-10-CM

## 2022-05-01 ENCOUNTER — Other Ambulatory Visit: Payer: Self-pay

## 2022-05-01 DIAGNOSIS — G08 Intracranial and intraspinal phlebitis and thrombophlebitis: Secondary | ICD-10-CM

## 2022-05-01 NOTE — Progress Notes (Signed)
Patient called stating she did not get MRI done yesterday and needed new orders as they didn't have what they needed to see her neck. Called MRI, new orders placed, pending authorization.

## 2022-05-07 ENCOUNTER — Ambulatory Visit (HOSPITAL_COMMUNITY)
Admission: RE | Admit: 2022-05-07 | Discharge: 2022-05-07 | Disposition: A | Discharge status: Home / Self Care | Payer: No Typology Code available for payment source | Source: Ambulatory Visit | Attending: Hematology & Oncology | Admitting: Hematology & Oncology

## 2022-05-07 DIAGNOSIS — G08 Intracranial and intraspinal phlebitis and thrombophlebitis: Secondary | ICD-10-CM | POA: Diagnosis present

## 2022-05-07 MED ORDER — GADOBUTROL 1 MMOL/ML IV SOLN
6.0000 mL | Freq: Once | INTRAVENOUS | Status: AC | PRN
Start: 2022-05-07 — End: 2022-05-07
  Administered 2022-05-07: 6 mL via INTRAVENOUS

## 2022-05-09 ENCOUNTER — Other Ambulatory Visit: Payer: Self-pay | Admitting: Hematology & Oncology

## 2022-05-09 DIAGNOSIS — G08 Intracranial and intraspinal phlebitis and thrombophlebitis: Secondary | ICD-10-CM

## 2022-05-11 ENCOUNTER — Telehealth: Payer: Self-pay

## 2022-05-11 NOTE — Telephone Encounter (Signed)
-----   Message from Volanda Napoleon, MD sent at 05/09/2022  6:15 PM EDT ----- Please call her and let her know that the MRA of the neck did not show any obvious issues with her blood vessels.  There is no thrombi in the jugular veins.  Unfortunately, they did not do the right MRI of the brain to look at the dural sinuses.  I am not sure what why this happened.  We really need to get an MRV of the brain.  I will have to order this.  Laurey Arrow

## 2022-05-12 ENCOUNTER — Telehealth: Payer: Self-pay

## 2022-05-12 NOTE — Telephone Encounter (Signed)
-----   Message from Johny Drilling, RN sent at 05/12/2022  1:04 PM EDT ----- This was the original message I was questioning:   Please call her and let her know that the MRA of the neck did not show any obvious issues with her blood vessels.  There is no thrombi in the jugular veins.  Unfortunately, they did not do the right MRI of the brain to look at the dural sinuses.  I am not sure what why this happened.  We really need to get an MRV of the brain.  I will have to order this.  Sydney [name]   ----- Message ----- From: Volanda Napoleon, MD Sent: 05/10/2022   8:51 PM EDT To: Johny Drilling, RN  They did the wrong MRI.  It should have been an MRV of the brain.  Sydney [name] ----- Message ----- From: Johny Drilling, RN Sent: 05/10/2022  11:54 AM EDT To: Volanda Napoleon, MD  Looks like she did have an MRI of the brain that resulted yesterday. Do you want to change the message to her? Thanks! Miss you! [name]  ----- Message ----- From: Volanda Napoleon, MD Sent: 05/09/2022   6:15 PM EDT To: Onc Nurse Hp  Please call her and let her know that the MRA of the neck did not show any obvious issues with her blood vessels.  There is no thrombi in the jugular veins.  Unfortunately, they did not do the right MRI of the brain to look at the dural sinuses.  I am not sure what why this happened.  We really need to get an MRV of the brain.  I will have to order this.  Sydney [name]

## 2022-05-12 NOTE — Telephone Encounter (Signed)
Advised via MyChart.

## 2022-05-18 ENCOUNTER — Other Ambulatory Visit: Payer: Self-pay | Admitting: *Deleted

## 2022-05-18 DIAGNOSIS — I82C12 Acute embolism and thrombosis of left internal jugular vein: Secondary | ICD-10-CM

## 2022-05-18 DIAGNOSIS — G08 Intracranial and intraspinal phlebitis and thrombophlebitis: Secondary | ICD-10-CM

## 2022-05-19 ENCOUNTER — Ambulatory Visit (HOSPITAL_COMMUNITY): Payer: No Typology Code available for payment source

## 2022-05-19 ENCOUNTER — Other Ambulatory Visit (HOSPITAL_COMMUNITY): Payer: No Typology Code available for payment source

## 2022-05-21 ENCOUNTER — Ambulatory Visit (HOSPITAL_COMMUNITY)
Admission: RE | Admit: 2022-05-21 | Discharge: 2022-05-21 | Disposition: A | Discharge status: Home / Self Care | Payer: No Typology Code available for payment source | Source: Ambulatory Visit | Attending: Hematology & Oncology | Admitting: Hematology & Oncology

## 2022-05-21 DIAGNOSIS — G08 Intracranial and intraspinal phlebitis and thrombophlebitis: Secondary | ICD-10-CM | POA: Insufficient documentation

## 2022-05-21 MED ORDER — GADOBUTROL 1 MMOL/ML IV SOLN
6.0000 mL | Freq: Once | INTRAVENOUS | Status: AC | PRN
Start: 2022-05-21 — End: 2022-05-21
  Administered 2022-05-21: 6 mL via INTRAVENOUS

## 2022-06-07 ENCOUNTER — Inpatient Hospital Stay: Payer: No Typology Code available for payment source

## 2022-06-07 ENCOUNTER — Ambulatory Visit: Payer: No Typology Code available for payment source | Admitting: Hematology & Oncology

## 2022-06-15 ENCOUNTER — Inpatient Hospital Stay: Payer: No Typology Code available for payment source | Attending: Hematology & Oncology

## 2022-06-15 ENCOUNTER — Inpatient Hospital Stay (HOSPITAL_BASED_OUTPATIENT_CLINIC_OR_DEPARTMENT_OTHER): Payer: No Typology Code available for payment source | Admitting: Hematology & Oncology

## 2022-06-15 ENCOUNTER — Other Ambulatory Visit: Payer: Self-pay

## 2022-06-15 ENCOUNTER — Encounter: Payer: Self-pay | Admitting: Hematology & Oncology

## 2022-06-15 VITALS — BP 127/69 | HR 55 | Temp 97.7°F | Resp 18 | Ht 62.0 in | Wt 149.1 lb

## 2022-06-15 DIAGNOSIS — D509 Iron deficiency anemia, unspecified: Secondary | ICD-10-CM | POA: Insufficient documentation

## 2022-06-15 DIAGNOSIS — Z7901 Long term (current) use of anticoagulants: Secondary | ICD-10-CM | POA: Diagnosis not present

## 2022-06-15 DIAGNOSIS — G08 Intracranial and intraspinal phlebitis and thrombophlebitis: Secondary | ICD-10-CM | POA: Diagnosis present

## 2022-06-15 DIAGNOSIS — E7801 Familial hypercholesterolemia: Secondary | ICD-10-CM | POA: Diagnosis not present

## 2022-06-15 LAB — CMP (CANCER CENTER ONLY)
ALT: 20 U/L (ref 0–44)
AST: 26 U/L (ref 15–41)
Albumin: 4.5 g/dL (ref 3.5–5.0)
Alkaline Phosphatase: 80 U/L (ref 38–126)
Anion gap: 6 (ref 5–15)
BUN: 20 mg/dL (ref 6–20)
CO2: 30 mmol/L (ref 22–32)
Calcium: 10.3 mg/dL (ref 8.9–10.3)
Chloride: 102 mmol/L (ref 98–111)
Creatinine: 1.07 mg/dL — ABNORMAL HIGH (ref 0.44–1.00)
GFR, Estimated: >60 mL/min (ref >60)
Glucose, Bld: 82 mg/dL (ref 70–99)
Potassium: 3.9 mmol/L (ref 3.5–5.1)
Sodium: 138 mmol/L (ref 135–145)
Total Bilirubin: 0.3 mg/dL (ref 0.3–1.2)
Total Protein: 8.6 g/dL — ABNORMAL HIGH (ref 6.5–8.1)

## 2022-06-15 LAB — CBC WITH DIFFERENTIAL (CANCER CENTER ONLY)
Abs Immature Granulocytes: 0 10*3/uL (ref 0.00–0.07)
Basophils Absolute: 0 10*3/uL (ref 0.0–0.1)
Basophils Relative: 1 %
Eosinophils Absolute: 0.1 10*3/uL (ref 0.0–0.5)
Eosinophils Relative: 2 %
HCT: 40.2 % (ref 36.0–46.0)
Hemoglobin: 13.3 g/dL (ref 12.0–15.0)
Immature Granulocytes: 0 %
Lymphocytes Relative: 36 %
Lymphs Abs: 1.4 10*3/uL (ref 0.7–4.0)
MCH: 30.8 pg (ref 26.0–34.0)
MCHC: 33.1 g/dL (ref 30.0–36.0)
MCV: 93.1 fL (ref 80.0–100.0)
Monocytes Absolute: 0.3 10*3/uL (ref 0.1–1.0)
Monocytes Relative: 8 %
Neutro Abs: 2.1 10*3/uL (ref 1.7–7.7)
Neutrophils Relative %: 53 %
Platelet Count: 229 10*3/uL (ref 150–400)
RBC: 4.32 MIL/uL (ref 3.87–5.11)
RDW: 12 % (ref 11.5–15.5)
WBC Count: 3.9 10*3/uL — ABNORMAL LOW (ref 4.0–10.5)
nRBC: 0 % (ref 0.0–0.2)

## 2022-06-15 LAB — LACTATE DEHYDROGENASE: LDH: 141 U/L (ref 98–192)

## 2022-06-15 LAB — FERRITIN: Ferritin: 99 ng/mL (ref 11–307)

## 2022-06-15 MED ORDER — DABIGATRAN ETEXILATE MESYLATE 75 MG PO CAPS: 75.0000 mg | ORAL_CAPSULE | Freq: Two times a day (BID) | ORAL | 12 refills | Status: DC

## 2022-06-15 NOTE — Addendum Note (Signed)
Addended by: Burney Gauze R on: 06/15/2022 05:11 PM   Modules accepted: Orders

## 2022-06-15 NOTE — Progress Notes (Signed)
Hematology and Oncology Follow Up Visit  Sydney [name] [id number] 1966-02-22 56 y.o. 06/15/2022   Principle Diagnosis:  Cerebral venous sinus thrombosis  Chronic left jugular thombus Iron deficiency anemia    Current Therapy:        Pradaxa 150 mg PO BID IV iron as indicated    Interim History:  Sydney [name] is here today for follow-up.  She is still working.  She is doing her best to work.  She still has had some cognitive complications from the chronic venous thrombosis that she had.  She developed this after she had a COVID vaccine.  Thankfully, when we did do her follow-up MRIs, we did not see any evidence of thrombus.  We did a venous MRI of her neck.  This did not show any residual thrombus.  We did an MRI of the brain.  All this was done on 05/07/2022.  MRI of the brain showed trace bilateral subdural hygromas.  We also did an MRA of the neck.  This showed no stenoses or thrombi in the arterial supply in the neck.  Again, she is still somewhat affected by the thrombi.  She has a hard time exercising.  She does get headaches when she tries exercise.  She did go to the beach.  She did have a nice time.  She has had no fever.  She has had no cough or shortness of breath.  She has had no change in bowel or bladder habits.  She has had some occasional leg weakness.  Currently, I would have to say that her performance status is ECOG 1-2.     Medications:  Allergies as of 06/15/2022       Reactions   Covid-19 (mrna) Vaccine Anaphylaxis   BLOOD CLOTS   Doxycycline Nausea And Vomiting        Medication List        Accurate as of June 15, 2022  4:26 PM. If you have any questions, ask your nurse or doctor.          Biotin 5 MG Tabs Take 5 mg by mouth at bedtime. Chewable   dabigatran 75 MG Caps capsule Commonly known as: Pradaxa Take 1 capsule (75 mg total) by mouth 2 (two) times daily. What changed:  medication strength how much to take Changed by: Volanda Napoleon, MD   multivitamin tablet Take 1 tablet by mouth daily.   valACYclovir 500 MG tablet Commonly known as: VALTREX Take 500 mg by mouth 2 (two) times daily.        Allergies:  Allergies  Allergen Reactions   Covid-19 (Mrna) Vaccine Anaphylaxis    BLOOD CLOTS   Doxycycline Nausea And Vomiting    Past Medical History, Surgical history, Social history, and Family History were reviewed and updated.  Review of Systems: Review of Systems  Constitutional:  Positive for malaise/fatigue.  HENT: Negative.    Eyes: Negative.   Respiratory: Negative.    Cardiovascular: Negative.   Gastrointestinal: Negative.   Genitourinary: Negative.   Musculoskeletal: Negative.   Skin: Negative.   Neurological:  Positive for dizziness and focal weakness.  Endo/Heme/Allergies: Negative.      Physical Exam:  height is '5\' 2"'$  (1.575 m) and weight is 149 lb 1.9 oz (67.6 kg). Her oral temperature is 97.7 F (36.5 C). Her blood pressure is 127/69 and her pulse is 55 (abnormal). Her respiration is 18 and oxygen saturation is 100%.   Wt Readings from Last 3 Encounters:  06/15/22  149 lb 1.9 oz (67.6 kg)  04/19/22 145 lb 1.9 oz (65.8 kg)  10/19/21 128 lb 1.3 oz (58.1 kg)    Physical Exam Vitals reviewed.  HENT:     Head: Normocephalic and atraumatic.  Eyes:     Pupils: Pupils are equal, [name], and reactive to light.  Cardiovascular:     Rate and Rhythm: Normal rate and regular rhythm.     Heart sounds: Normal heart sounds.  Pulmonary:     Effort: Pulmonary effort is normal.     Breath sounds: Normal breath sounds.  Abdominal:     General: Bowel sounds are normal.     Palpations: Abdomen is soft.  Musculoskeletal:        General: No tenderness or deformity. Normal range of motion.     Cervical back: Normal range of motion.  Lymphadenopathy:     Cervical: No cervical adenopathy.  Skin:    General: Skin is warm and dry.     Findings: No erythema or rash.  Neurological:      Mental Status: She is alert and oriented to person, place, and time.  Psychiatric:        Behavior: Behavior normal.        Thought Content: Thought content normal.        Judgment: Judgment normal.      Lab Results  Component Value Date   WBC 3.9 (L) 06/15/2022   HGB 13.3 06/15/2022   HCT 40.2 06/15/2022   MCV 93.1 06/15/2022   PLT 229 06/15/2022   Lab Results  Component Value Date   FERRITIN 110 04/19/2022   IRON 80 04/19/2022   TIBC 274 04/19/2022   UIBC 194 04/19/2022   IRONPCTSAT 29 04/19/2022   Lab Results  Component Value Date   RETICCTPCT 1.7 10/31/2020   RBC 4.32 06/15/2022   No results found for: "KPAFRELGTCHN", "LAMBDASER", "KAPLAMBRATIO" No results found for: "IGGSERUM", "IGA", "IGMSERUM" Lab Results  Component Value Date   ALBUMINELP 3.8 09/28/2020   A1GS 0.5 (H) 09/28/2020   A2GS 0.9 09/28/2020   BETS 0.6 09/28/2020   BETA2SER 0.4 09/28/2020   GAMS 1.7 09/28/2020   SPEI  09/28/2020     Comment:     . Alpha-1 globulin increase noted. .      Chemistry      Component Value Date/Time   NA 138 06/15/2022 1446   K 3.9 06/15/2022 1446   CL 102 06/15/2022 1446   CO2 30 06/15/2022 1446   BUN 20 06/15/2022 1446   CREATININE 1.07 (H) 06/15/2022 1446   CREATININE 1.29 (H) 11/12/2020 1557      Component Value Date/Time   CALCIUM 10.3 06/15/2022 1446   ALKPHOS 80 06/15/2022 1446   AST 26 06/15/2022 1446   ALT 20 06/15/2022 1446   BILITOT 0.3 06/15/2022 1446       Impression and Plan: Sydney [name] is a very pleasant 56 yo caucasian female with diagnosis of nonocclusive thrombus within the mid superior sagittal sinus extending posteriorly, left sigmoid sinus and visualized left internal jugular vein.  This all occurred after she had a COVID vaccine.  I am thankful that the thrombi have resolved.  I am sure that she will still have some long-term effect from the [place] and having these thrombi.  I just do not think that she should be expected to have  a COVID vaccine.  I just feel that she would run the risk of another thromboembolic event.  I realize that she is  on Pradaxa.  She is on baby aspirin.  I am going to decrease the dose of the Pradaxa to 75 mg p.o. twice daily.  This would be a good maintenance dose for her.  I will like to see her back in another 3 months.  I will see her back around the holidays.    Volanda Napoleon, MD 8/17/20234:26 PM

## 2022-06-16 LAB — IRON AND IRON BINDING CAPACITY (CC-WL,HP ONLY)
Iron: 91 ug/dL (ref 28–170)
Saturation Ratios: 33 % — ABNORMAL HIGH (ref 10.4–31.8)
TIBC: 273 ug/dL (ref 250–450)
UIBC: 182 ug/dL (ref 148–442)

## 2022-07-12 ENCOUNTER — Encounter: Payer: Self-pay | Admitting: Hematology & Oncology

## 2022-07-14 ENCOUNTER — Encounter: Payer: Self-pay | Admitting: Hematology & Oncology

## 2022-09-05 ENCOUNTER — Encounter: Payer: Self-pay | Admitting: Family

## 2022-09-05 ENCOUNTER — Ambulatory Visit
Admission: RE | Admit: 2022-09-05 | Discharge: 2022-09-05 | Disposition: A | Discharge status: Home / Self Care | Payer: No Typology Code available for payment source | Source: Ambulatory Visit | Attending: Hematology & Oncology | Admitting: Hematology & Oncology

## 2022-09-05 DIAGNOSIS — E7801 Familial hypercholesterolemia: Secondary | ICD-10-CM

## 2022-10-02 ENCOUNTER — Ambulatory Visit: Payer: No Typology Code available for payment source | Admitting: Hematology & Oncology

## 2022-10-02 ENCOUNTER — Inpatient Hospital Stay: Payer: No Typology Code available for payment source

## 2022-10-12 ENCOUNTER — Encounter: Payer: Self-pay | Admitting: Cardiovascular Disease

## 2022-10-12 NOTE — Progress Notes (Signed)
Cardiology Office Note:    Date:  10/13/2022   ID:  Sydney [name], DOB 1966-10-19, MRN [id number]  PCP:  Lois Huxley, [facility] Cardiologist:  [facility]    Referring MD: Lois Huxley, [place]   Chief Complaint  Patient presents with   coronary calcifications    History of Present Illness:    Sydney "Arrie Aran" is a 56 y.o. female with a hx of coronary calcifications, hyperlipidemia , DVT and sagital sinus thrombosis following COVID 19 vaccine .  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   Had a severe reaction to her COVID booster , eventually had a stroke   She was recently was found to have coronary calcifications  CAC is 58,  94 th % for age and gender matched controls  Was very active,  doing obstacle course races previously , ran marathons, spartan races,   Now is not able to walk. Has syncope or N/V with any intense workouts   Has been treated for long COVID ,   She has coronary artery calcifications Has tried atorvastatin at multiple doses, has severe myalgias with all statin doses.   Had a bad reaction to her last flu vaccine .   No CP ,  has dyspnea   Does not know her parents      Past Medical History:  Diagnosis Date   Anemia    Blood clots in brain    COVID    COVID-19    Sept 2022   Headache    History of cold sores    HPV (human papilloma virus) infection    Post-operative nausea and vomiting    Stroke [facility])     Past Surgical History:  Procedure Laterality Date   BLEPHAROPLASTY  2005   CESAREAN SECTION  2002    Current Medications: Current Meds  Medication Sig   Biotin 5 MG TABS Take 5 mg by mouth at bedtime. Chewable   dabigatran (PRADAXA) 75 MG CAPS capsule Take 1 capsule (75 mg total) by mouth 2 (two) times daily.   metoprolol tartrate (LOPRESSOR) 25 MG tablet Take 1 tablet by mouth two hours prior to scan   Multiple Vitamin (MULTIVITAMIN) tablet Take 1 tablet  by mouth daily.   valACYclovir (VALTREX) 500 MG tablet Take 500 mg by mouth 2 (two) times daily.     Allergies:   Covid-19 (mrna) vaccine and Doxycycline   Social History   Socioeconomic History   Marital status: Divorced    Spouse name: Not on file   Number of children: Not on file   Years of education: Not on file   Highest education level: Not on file  Occupational History   Occupation: disability  Tobacco Use   Smoking status: Never   Smokeless tobacco: Never  Vaping Use   Vaping Use: Never used  Substance and Sexual Activity   Alcohol use: Never   Drug use: Never   Sexual activity: Not Currently  Other Topics Concern   Not on file  Social History Narrative   Lives with son   Right Handed   Drinks no caffeine   Social Determinants of Health   Financial Resource Strain: Not on file  Food Insecurity: Not on file  Transportation Needs: Not on file  Physical Activity: Not on file  Stress: Not on file  Social Connections: Not on file     Family History: The patient's family history includes  Lung cancer in her maternal grandfather and maternal grandmother. There is no history of Colon cancer, Colon polyps, Esophageal cancer, Rectal cancer, or Stomach cancer.  ROS:   Please see the history of present illness.     All other systems reviewed and are negative.  EKGs/Labs/Other Studies Reviewed:    The following studies were reviewed today:   EKG: October 13, 2022: Sinus bradycardia 57.  Right axis deviation.  Otherwise normal EKG.  Recent Labs: 06/15/2022: ALT 20; BUN 20; Creatinine 1.07; Hemoglobin 13.3; Platelet Count 229; Potassium 3.9; Sodium 138  Recent Lipid Panel    Component Value Date/Time   CHOL 239 (H) 08/27/2020 1433   TRIG 73 08/27/2020 1433   HDL 79 08/27/2020 1433   CHOLHDL 3.0 08/27/2020 1433   VLDL 11.2 08/20/2019 1536   LDLCALC 143 (H) 08/27/2020 1433     Risk Assessment/Calculations:                Physical Exam:    VS:  BP  130/86   Pulse (!) 57   Ht '5\' 2"'$  (1.575 m)   Wt 150 lb (68 kg)   SpO2 97%   BMI 27.44 kg/m     Wt Readings from Last 3 Encounters:  10/13/22 150 lb (68 kg)  06/15/22 149 lb 1.9 oz (67.6 kg)  04/19/22 145 lb 1.9 oz (65.8 kg)     GEN:  Well nourished, well developed in no acute distress HEENT: Normal NECK: No JVD; No carotid bruits LYMPHATICS: No lymphadenopathy CARDIAC: RRR, no murmurs, rubs, gallops RESPIRATORY:  Clear to auscultation without rales, wheezing or rhonchi  ABDOMEN: Soft, non-tender, non-distended MUSCULOSKELETAL:  No edema; No deformity  SKIN: Warm and dry NEUROLOGIC:  Alert and oriented x 3 PSYCHIATRIC:  Normal affect   ASSESSMENT:    1. DOE (dyspnea on exertion)   2. Mixed hyperlipidemia   3. Stenosis of carotid artery, unspecified laterality    PLAN:    In order of problems listed above:  Shortness of breath with exertion: She has had shortness of breath with exertion since her stroke following her COVID vaccine.  Has had multiple complications since her COVID booster.  She wonders whether these are possibly symptoms of long COVID.  Has a history of hyperlipidemia.  Does not tolerate statins at all.  It is possible that she has developed coronary artery disease.  Will schedule her for a coronary CT angiogram.   2.  Hyperlipidemia: History of elevated LDL.  Will check a lipoprotein a, lipid profile, ALT, basic metabolic profile.  Will refer her to the lipid clinic for further evaluation and consideration for PCSK9 inhibitor.   Medication Adjustments/Labs and Tests Ordered: Current medicines are reviewed at length with the patient today.  Concerns regarding medicines are outlined above.  Orders Placed This Encounter  Procedures   CT CORONARY MORPH W/CTA COR W/SCORE W/CA W/CM &/OR WO/CM   Lipid panel   ALT   Basic metabolic panel   Lipoprotein A (LPA)   AMB Referral to [facility] Pharm-D   EKG 12-Lead   Meds ordered this encounter  Medications    metoprolol tartrate (LOPRESSOR) 25 MG tablet    Sig: Take 1 tablet by mouth two hours prior to scan    Dispense:  1 tablet    Refill:  0    Patient Instructions  Medication Instructions:  Your physician recommends that you continue on your current medications as directed. Please refer to the Current Medication list given to you today.  *  If you need a refill on your cardiac medications before your next appointment, please call your pharmacy*   Lab Work: Lipids, ALT, BMET, LP(a) today If you have labs (blood work) drawn today and your tests are completely normal, you will receive your results only by: [place] (if you have MyChart) OR A paper copy in the mail If you have any lab test that is abnormal or we need to change your treatment, we will call you to review the results.   Testing/Procedures: Coronary CT Angiogram Your physician has requested that you have cardiac CT. Cardiac computed tomography (CT) is a painless test that uses an x-ray machine to take clear, detailed pictures of your heart. For further information please visit [email or web address]. Please follow instruction sheet as given.  Follow-Up: At [facility], you and your health needs are our priority.  As part of our continuing mission to provide you with exceptional heart care, we have created designated Provider Care Teams.  These Care Teams include your primary Cardiologist (physician) and Advanced Practice Providers (APPs -  Physician Assistants and Nurse Practitioners) who all work together to provide you with the care you need, when you need it.  We recommend signing up for the patient portal called "MyChart".  Sign up information is provided on this After Visit Summary.  MyChart is used to connect with patients for Virtual Visits (Telemedicine).  Patients are able to view lab/test results, encounter notes, upcoming appointments, etc.  Non-urgent messages can be sent to your provider as well.   To  learn more about what you can do with MyChart, go to [email or web address].    Your next appointment:   3 month(s)  The format for your next appointment:   In Person  Provider:   Mertie Moores, MD  Other Instructions   Your cardiac CT will be scheduled at:   [facility] [address] [place], [place] [address] [phone]  please arrive at the [facility] (Entrance C2) of [facility] 30 minutes prior to test start time. You can use the FREE valet parking offered at entrance C (encouraged to control the heart rate for the test)  Proceed to the [facility] Radiology Department (first floor) to check-in and test prep.  All radiology patients and guests should use entrance C2 at [facility], accessed from [facility], even though the hospital's physical address listed is [address]    Please follow these instructions carefully (unless otherwise directed):  On the Night Before the Test: Be sure to Drink plenty of water. Do not consume any caffeinated/decaffeinated beverages or chocolate 12 hours prior to your test. Do not take any antihistamines 12 hours prior to your test.  On the Day of the Test: Drink plenty of water until 1 hour prior to the test. Do not eat any food 1 hour prior to test. You may take your regular medications prior to the test.  Take metoprolol (Lopressor) two hours prior to test. FEMALES- please wear underwire-free bra if available, avoid dresses & tight clothing      After the Test: Drink plenty of water. After receiving IV contrast, you may experience a mild flushed feeling. This is normal. On occasion, you may experience a mild rash up to 24 hours after the test. This is not dangerous. If this occurs, you can take Benadryl 25 mg and increase your fluid intake. If you experience trouble breathing, this can be serious. If it is  severe call 911 IMMEDIATELY. If it is mild,  please call our office. If you take any of these medications: Glipizide/Metformin, Avandament, Glucavance, please do not take 48 hours after completing test unless otherwise instructed.  We will call to schedule your test 2-4 weeks out understanding that some insurance companies will need an authorization prior to the service being performed.   For non-scheduling related questions, please contact the cardiac imaging nurse navigator should you have any questions/concerns: Marchia Bond, Cardiac Imaging Nurse Navigator Gordy Clement, Cardiac Imaging Nurse Navigator  [facility] V[facility] Services Direct Office Dial: [phone]   For scheduling needs, including cancellations and rescheduling, please call [place], [phone].   Important Information About Sugar       Adopting a Healthy Lifestyle.   Weight: Know what a healthy weight is for you (roughly BMI <25) and aim to maintain this. You can calculate your body mass index on your smart phone  Diet: Aim for 7+ servings of fruits and vegetables daily Limit animal fats in diet for cholesterol and heart health - choose grass fed whenever available Avoid highly processed foods (fast food burgers, tacos, fried chicken, pizza, hot dogs, french fries)  Saturated fat comes in the form of butter, lard, coconut oil, margarine, partially hydrogenated oils, and fat in meat. These increase your risk of cardiovascular disease.  Use healthy plant oils, such as olive, canola, soy, corn, sunflower and peanut.  Whole foods such as fruits, vegetables and whole grains have fiber  Men need > 38 grams of fiber per day Women need > 25 grams of fiber per day  Load up on vegetables and fruits - one-half of your plate: Aim for color and variety, and remember that potatoes dont count. Go for whole grains - one-quarter of your plate: Whole wheat, barley, wheat berries, quinoa, oats, brown rice, and foods made with them. If you want pasta, go with  whole wheat pasta. Protein power - one-quarter of your plate: Fish, chicken, beans, and nuts are all healthy, versatile protein sources. Limit red meat. You need carbohydrates for energy! The type of carbohydrate is more important than the amount. Choose carbohydrates such as vegetables, fruits, whole grains, beans, and nuts in the place of white rice, white pasta, potatoes (baked or fried), macaroni and cheese, cakes, cookies, and donuts.  If youre thirsty, drink water. Coffee and tea are good in moderation, but skip sugary drinks and limit milk and dairy products to one or two daily servings. Keep sugar intake at 6 teaspoons or 24 grams or LESS       Exercise: Aim for 150 min of moderate intensity exercise weekly for heart health, and weights twice weekly for bone health Stay active - any steps are better than no steps! Aim for 7-9 hours of sleep daily     Avoid foods that are White, wheat, sweet  - white foods - limit your  rice, pasta, potatoes, corn.  if you do eat these, make sure its brown rice, whole wheat pasta  - wheat - limit intake of all bread, biscuits, pizza dough.   The bread you do eat should be whole wheat bread.  - Sweets - limit cakes, cookies, desserts, ice cream, soda, sweet tea   Increase exercise         Signed, Mertie Moores, MD  10/13/2022 6:21 PM    [place]

## 2022-10-13 ENCOUNTER — Ambulatory Visit
Payer: No Typology Code available for payment source | Attending: Cardiovascular Disease | Admitting: Cardiovascular Disease

## 2022-10-13 ENCOUNTER — Encounter: Payer: Self-pay | Admitting: Cardiovascular Disease

## 2022-10-13 VITALS — BP 130/86 | HR 57 | Ht 62.0 in | Wt 150.0 lb

## 2022-10-13 DIAGNOSIS — R0609 Other forms of dyspnea: Secondary | ICD-10-CM | POA: Diagnosis not present

## 2022-10-13 DIAGNOSIS — I6529 Occlusion and stenosis of unspecified carotid artery: Secondary | ICD-10-CM | POA: Diagnosis not present

## 2022-10-13 DIAGNOSIS — E782 Mixed hyperlipidemia: Secondary | ICD-10-CM | POA: Diagnosis not present

## 2022-10-13 MED ORDER — METOPROLOL TARTRATE 25 MG PO TABS: ORAL_TABLET | ORAL | 0 refills | Status: DC

## 2022-10-13 NOTE — Patient Instructions (Addendum)
Medication Instructions:  Your physician recommends that you continue on your current medications as directed. Please refer to the Current Medication list given to you today.  *If you need a refill on your cardiac medications before your next appointment, please call your pharmacy*   Lab Work: Lipids, ALT, BMET, LP(a) today If you have labs (blood work) drawn today and your tests are completely normal, you will receive your results only by: [place] (if you have MyChart) OR A paper copy in the mail If you have any lab test that is abnormal or we need to change your treatment, we will call you to review the results.   Testing/Procedures: Coronary CT Angiogram Your physician has requested that you have cardiac CT. Cardiac computed tomography (CT) is a painless test that uses an x-ray machine to take clear, detailed pictures of your heart. For further information please visit [email or web address]. Please follow instruction sheet as given.  Follow-Up: At [facility], you and your health needs are our priority.  As part of our continuing mission to provide you with exceptional heart care, we have created designated Provider Care Teams.  These Care Teams include your primary Cardiologist (physician) and Advanced Practice Providers (APPs -  Physician Assistants and Nurse Practitioners) who all work together to provide you with the care you need, when you need it.  We recommend signing up for the patient portal called "MyChart".  Sign up information is provided on this After Visit Summary.  MyChart is used to connect with patients for Virtual Visits (Telemedicine).  Patients are able to view lab/test results, encounter notes, upcoming appointments, etc.  Non-urgent messages can be sent to your provider as well.   To learn more about what you can do with MyChart, go to [email or web address].    Your next appointment:   3 month(s)  The format for your next appointment:   In  Person  Provider:   Mertie Moores, MD  Other Instructions   Your cardiac CT will be scheduled at:   [facility] [address] [place], [place] [address] [phone]  please arrive at the [facility] Entrance (Entrance C2) of [facility] 30 minutes prior to test start time. You can use the FREE valet parking offered at entrance C (encouraged to control the heart rate for the test)  Proceed to the [facility] Radiology Department (first floor) to check-in and test prep.  All radiology patients and guests should use entrance C2 at [facility], accessed from [facility], even though the hospital's physical address listed is [address]    Please follow these instructions carefully (unless otherwise directed):  On the Night Before the Test: Be sure to Drink plenty of water. Do not consume any caffeinated/decaffeinated beverages or chocolate 12 hours prior to your test. Do not take any antihistamines 12 hours prior to your test.  On the Day of the Test: Drink plenty of water until 1 hour prior to the test. Do not eat any food 1 hour prior to test. You may take your regular medications prior to the test.  Take metoprolol (Lopressor) two hours prior to test. FEMALES- please wear underwire-free bra if available, avoid dresses & tight clothing      After the Test: Drink plenty of water. After receiving IV contrast, you may experience a mild flushed feeling. This is normal. On occasion, you may experience a mild rash up to 24 hours after the test. This is not  dangerous. If this occurs, you can take Benadryl 25 mg and increase your fluid intake. If you experience trouble breathing, this can be serious. If it is severe call 911 IMMEDIATELY. If it is mild, please call our office. If you take any of these medications: Glipizide/Metformin, Avandament, Glucavance, please do not take 48 hours after completing test unless  otherwise instructed.  We will call to schedule your test 2-4 weeks out understanding that some insurance companies will need an authorization prior to the service being performed.   For non-scheduling related questions, please contact the cardiac imaging nurse navigator should you have any questions/concerns: Marchia Bond, Cardiac Imaging Nurse Navigator Gordy Clement, Cardiac Imaging Nurse Navigator [facility] Direct Office Dial: [phone]   For scheduling needs, including cancellations and rescheduling, please call [place], [phone].   Important Information About Sugar       Adopting a Healthy Lifestyle.   Weight: Know what a healthy weight is for you (roughly BMI <25) and aim to maintain this. You can calculate your body mass index on your smart phone  Diet: Aim for 7+ servings of fruits and vegetables daily Limit animal fats in diet for cholesterol and heart health - choose grass fed whenever available Avoid highly processed foods (fast food burgers, tacos, fried chicken, pizza, hot dogs, french fries)  Saturated fat comes in the form of butter, lard, coconut oil, margarine, partially hydrogenated oils, and fat in meat. These increase your risk of cardiovascular disease.  Use healthy plant oils, such as olive, canola, soy, corn, sunflower and peanut.  Whole foods such as fruits, vegetables and whole grains have fiber  Men need > 38 grams of fiber per day Women need > 25 grams of fiber per day  Load up on vegetables and fruits - one-half of your plate: Aim for color and variety, and remember that potatoes dont count. Go for whole grains - one-quarter of your plate: Whole wheat, barley, wheat berries, quinoa, oats, brown rice, and foods made with them. If you want pasta, go with whole wheat pasta. Protein power - one-quarter of your plate: Fish, chicken, beans, and nuts are all healthy, versatile protein sources. Limit red meat. You need  carbohydrates for energy! The type of carbohydrate is more important than the amount. Choose carbohydrates such as vegetables, fruits, whole grains, beans, and nuts in the place of white rice, white pasta, potatoes (baked or fried), macaroni and cheese, cakes, cookies, and donuts.  If youre thirsty, drink water. Coffee and tea are good in moderation, but skip sugary drinks and limit milk and dairy products to one or two daily servings. Keep sugar intake at 6 teaspoons or 24 grams or LESS       Exercise: Aim for 150 min of moderate intensity exercise weekly for heart health, and weights twice weekly for bone health Stay active - any steps are better than no steps! Aim for 7-9 hours of sleep daily     Avoid foods that are White, wheat, sweet  - white foods - limit your  rice, pasta, potatoes, corn.  if you do eat these, make sure its brown rice, whole wheat pasta  - wheat - limit intake of all bread, biscuits, pizza dough.   The bread you do eat should be whole wheat bread.  - Sweets - limit cakes, cookies, desserts, ice cream, soda, sweet tea   Increase exercise

## 2022-10-17 LAB — LIPID PANEL
Chol/HDL Ratio: 3.3 ratio (ref 0.0–4.4)
Cholesterol, Total: 220 mg/dL — ABNORMAL HIGH (ref 100–199)
HDL: 67 mg/dL (ref >39)
LDL Chol Calc (NIH): 142 mg/dL — ABNORMAL HIGH (ref 0–99)
Triglycerides: 65 mg/dL (ref 0–149)
VLDL Cholesterol Cal: 11 mg/dL (ref 5–40)

## 2022-10-17 LAB — BASIC METABOLIC PANEL
BUN/Creatinine Ratio: 12 (ref 9–23)
BUN: 12 mg/dL (ref 6–24)
CO2: 24 mmol/L (ref 20–29)
Calcium: 9.8 mg/dL (ref 8.7–10.2)
Chloride: 102 mmol/L (ref 96–106)
Creatinine, Ser: 1 mg/dL (ref 0.57–1.00)
Glucose: 89 mg/dL (ref 70–99)
Potassium: 3.8 mmol/L (ref 3.5–5.2)
Sodium: 140 mmol/L (ref 134–144)
eGFR: 67 mL/min/{1.73_m2} (ref >59)

## 2022-10-17 LAB — ALT: ALT: 19 IU/L (ref 0–32)

## 2022-10-17 LAB — LIPOPROTEIN A (LPA): Lipoprotein (a): 249 nmol/L — ABNORMAL HIGH (ref <75.0)

## 2022-10-18 ENCOUNTER — Inpatient Hospital Stay: Payer: No Typology Code available for payment source | Attending: Hematology & Oncology

## 2022-10-18 ENCOUNTER — Telehealth: Payer: Self-pay | Admitting: Cardiovascular Disease

## 2022-10-18 ENCOUNTER — Encounter: Payer: Self-pay | Admitting: Hematology & Oncology

## 2022-10-18 ENCOUNTER — Inpatient Hospital Stay (HOSPITAL_BASED_OUTPATIENT_CLINIC_OR_DEPARTMENT_OTHER): Payer: No Typology Code available for payment source | Admitting: Hematology & Oncology

## 2022-10-18 VITALS — BP 131/75 | HR 67 | Temp 99.2°F | Resp 20 | Ht 62.0 in | Wt 150.8 lb

## 2022-10-18 DIAGNOSIS — D509 Iron deficiency anemia, unspecified: Secondary | ICD-10-CM | POA: Diagnosis present

## 2022-10-18 DIAGNOSIS — D500 Iron deficiency anemia secondary to blood loss (chronic): Secondary | ICD-10-CM

## 2022-10-18 DIAGNOSIS — Z7901 Long term (current) use of anticoagulants: Secondary | ICD-10-CM | POA: Diagnosis not present

## 2022-10-18 DIAGNOSIS — E782 Mixed hyperlipidemia: Secondary | ICD-10-CM

## 2022-10-18 DIAGNOSIS — G08 Intracranial and intraspinal phlebitis and thrombophlebitis: Secondary | ICD-10-CM | POA: Diagnosis present

## 2022-10-18 LAB — CBC WITH DIFFERENTIAL (CANCER CENTER ONLY)
Abs Immature Granulocytes: 0.02 10*3/uL (ref 0.00–0.07)
Basophils Absolute: 0 10*3/uL (ref 0.0–0.1)
Basophils Relative: 1 %
Eosinophils Absolute: 0.1 10*3/uL (ref 0.0–0.5)
Eosinophils Relative: 1 %
HCT: 39.2 % (ref 36.0–46.0)
Hemoglobin: 13.4 g/dL (ref 12.0–15.0)
Immature Granulocytes: 0 %
Lymphocytes Relative: 30 %
Lymphs Abs: 1.5 10*3/uL (ref 0.7–4.0)
MCH: 31.7 pg (ref 26.0–34.0)
MCHC: 34.2 g/dL (ref 30.0–36.0)
MCV: 92.7 fL (ref 80.0–100.0)
Monocytes Absolute: 0.4 10*3/uL (ref 0.1–1.0)
Monocytes Relative: 8 %
Neutro Abs: 3 10*3/uL (ref 1.7–7.7)
Neutrophils Relative %: 60 %
Platelet Count: 250 10*3/uL (ref 150–400)
RBC: 4.23 MIL/uL (ref 3.87–5.11)
RDW: 12.5 % (ref 11.5–15.5)
WBC Count: 5 10*3/uL (ref 4.0–10.5)
nRBC: 0 % (ref 0.0–0.2)

## 2022-10-18 LAB — CMP (CANCER CENTER ONLY)
ALT: 15 U/L (ref 0–44)
AST: 22 U/L (ref 15–41)
Albumin: 4.6 g/dL (ref 3.5–5.0)
Alkaline Phosphatase: 70 U/L (ref 38–126)
Anion gap: 8 (ref 5–15)
BUN: 22 mg/dL — ABNORMAL HIGH (ref 6–20)
CO2: 29 mmol/L (ref 22–32)
Calcium: 10.1 mg/dL (ref 8.9–10.3)
Chloride: 103 mmol/L (ref 98–111)
Creatinine: 1.1 mg/dL — ABNORMAL HIGH (ref 0.44–1.00)
GFR, Estimated: 60 mL/min — ABNORMAL LOW (ref >60)
Glucose, Bld: 101 mg/dL — ABNORMAL HIGH (ref 70–99)
Potassium: 3.8 mmol/L (ref 3.5–5.1)
Sodium: 140 mmol/L (ref 135–145)
Total Bilirubin: 0.5 mg/dL (ref 0.3–1.2)
Total Protein: 8.4 g/dL — ABNORMAL HIGH (ref 6.5–8.1)

## 2022-10-18 LAB — LACTATE DEHYDROGENASE: LDH: 138 U/L (ref 98–192)

## 2022-10-18 NOTE — Telephone Encounter (Signed)
-----   Message from Thayer Headings, MD sent at 10/18/2022 10:54 AM EST ----- LDL is 142 LP(a) is 249. She is intolerant to statins.  Please refer to the lipid clinic for consideration for PCSK9 inhibitors

## 2022-10-18 NOTE — Progress Notes (Signed)
Hematology and Oncology Follow Up Visit  Sydney [name] [id number] [birth year] 56 y.o. 10/18/2022   Principle Diagnosis:  Cerebral venous sinus thrombosis  Chronic left jugular thombus Iron deficiency anemia    Current Therapy:        Pradaxa 150 mg PO BID IV iron as indicated    Interim History:  Sydney [name] is here today for follow-up.  She is doing pretty well.  She actually saw Dr. Liam [name] of cardiology.  She is going to have a cardiac cath on December 29.  He wants to be aggressive with respect to any kind of coagulation issues that she has Sydney [name] potential cardiac issues.  The big news that she and her son are going over to [place] in April.  It sounds like they will have a wonderful time over there.  She is still working.  She works in one of the Engineer, technical sales.  She still has some cognitive issues.  She is trying to work through these.  This all happened after she had her venous sinus thrombosis.  This happened after she had the COVID-vaccine.  She has had no problems with bleeding.  There is been no nausea or vomiting.  She has had no cough or shortness of breath.  She is still working.  She is doing her best to work.  She still has had some cognitive complications from the chronic venous thrombosis that she had.  She developed this after she had a COVID vaccine.  Her last iron studies that we did on her back in August showed a ferritin of 99 with an iron saturation of 33%.  She is due for a mammogram in January.  She had a nice birthday back in November.  This was right before Thanksgiving.  It sounds like she will have a nice Thanksgiving with her family.  Overall, I would say that her performance status is probably ECOG 1.  Medications:  Allergies as of 10/18/2022       Reactions   Covid-19 (mrna) Vaccine Anaphylaxis   BLOOD CLOTS   Doxycycline Nausea And Vomiting        Medication List        Accurate as of October 18, 2022  3:51  PM. If you have any questions, ask your nurse or doctor.          Biotin 5 MG Tabs Take 5 mg by mouth at bedtime. Chewable   dabigatran 75 MG Caps capsule Commonly known as: Pradaxa Take 1 capsule (75 mg total) by mouth 2 (two) times daily.   metoprolol tartrate 25 MG tablet Commonly known as: LOPRESSOR Take 1 tablet by mouth two hours prior to scan   multivitamin tablet Take 1 tablet by mouth daily.   valACYclovir 500 MG tablet Commonly known as: VALTREX Take 500 mg by mouth 2 (two) times daily as needed.        Allergies:  Allergies  Allergen Reactions   Covid-19 (Mrna) Vaccine Anaphylaxis    BLOOD CLOTS   Doxycycline Nausea And Vomiting    Past Medical History, Surgical history, Social history, and Family History were reviewed and updated.  Review of Systems: Review of Systems  Constitutional:  Positive for malaise/fatigue.  HENT: Negative.    Eyes: Negative.   Respiratory: Negative.    Cardiovascular: Negative.   Gastrointestinal: Negative.   Genitourinary: Negative.   Musculoskeletal: Negative.   Skin: Negative.   Neurological:  Positive for dizziness and focal weakness.  Endo/Heme/Allergies: Negative.  Physical Exam:  height is '5\' 2"'$  (1.575 m) and weight is 150 lb 12.8 oz (68.4 kg). Her oral temperature is 99.2 F (37.3 C). Her blood pressure is 131/75 and her pulse is 67. Her respiration is 20 and oxygen saturation is 100%.   Wt Readings from Last 3 Encounters:  10/18/22 150 lb 12.8 oz (68.4 kg)  10/13/22 150 lb (68 kg)  06/15/22 149 lb 1.9 oz (67.6 kg)    Physical Exam Vitals reviewed.  HENT:     Head: Normocephalic and atraumatic.  Eyes:     Pupils: Pupils are equal, round, and reactive to light.  Cardiovascular:     Rate and Rhythm: Normal rate and regular rhythm.     Heart sounds: Normal heart sounds.  Pulmonary:     Effort: Pulmonary effort is normal.     Breath sounds: Normal breath sounds.  Abdominal:     General: Bowel  sounds are normal.     Palpations: Abdomen is soft.  Musculoskeletal:        General: No tenderness or deformity. Normal range of motion.     Cervical back: Normal range of motion.  Lymphadenopathy:     Cervical: No cervical adenopathy.  Skin:    General: Skin is warm and dry.     Findings: No erythema or rash.  Neurological:     Mental Status: She is alert and oriented to person, place, and time.  Psychiatric:        Behavior: Behavior normal.        Thought Content: Thought content normal.        Judgment: Judgment normal.      Lab Results  Component Value Date   WBC 5.0 10/18/2022   HGB 13.4 10/18/2022   HCT 39.2 10/18/2022   MCV 92.7 10/18/2022   PLT 250 10/18/2022   Lab Results  Component Value Date   FERRITIN 99 06/15/2022   IRON 91 06/15/2022   TIBC 273 06/15/2022   UIBC 182 06/15/2022   IRONPCTSAT 33 (H) 06/15/2022   Lab Results  Component Value Date   RETICCTPCT 1.7 10/31/2020   RBC 4.23 10/18/2022   No results found for: "KPAFRELGTCHN", "LAMBDASER", "KAPLAMBRATIO" No results found for: "IGGSERUM", "IGA", "IGMSERUM" Lab Results  Component Value Date   ALBUMINELP 3.8 09/28/2020   A1GS 0.5 (H) 09/28/2020   A2GS 0.9 09/28/2020   BETS 0.6 09/28/2020   BETA2SER 0.4 09/28/2020   GAMS 1.7 09/28/2020   SPEI  09/28/2020     Comment:     . Alpha-1 globulin increase noted. .      Chemistry      Component Value Date/Time   NA 140 10/18/2022 1505   NA 140 10/13/2022 1421   K 3.8 10/18/2022 1505   CL 103 10/18/2022 1505   CO2 29 10/18/2022 1505   BUN 22 (H) 10/18/2022 1505   BUN 12 10/13/2022 1421   CREATININE 1.10 (H) 10/18/2022 1505   CREATININE 1.29 (H) 11/12/2020 1557      Component Value Date/Time   CALCIUM 10.1 10/18/2022 1505   ALKPHOS 70 10/18/2022 1505   AST 22 10/18/2022 1505   ALT 15 10/18/2022 1505   BILITOT 0.5 10/18/2022 1505       Impression and Plan: Sydney [name] is a very pleasant 56 yo caucasian female with diagnosis of  nonocclusive thrombus within the mid superior sagittal sinus extending posteriorly, left sigmoid sinus and visualized left internal jugular vein.  This all occurred after she had a  COVID vaccine.  I am thankful that the thrombi have resolved.  I am sure that she will still have some long-term effect from the [place] and having these thrombi.  Will be very interesting to see what the cardiac cath has to show.  I would like to get her back in 3 months.  She does not have to get off the Pradaxa for the cardiac cath.  We will see what her iron studies also show.    Volanda Napoleon, MD 12/20/20233:51 PM

## 2022-10-18 NOTE — Telephone Encounter (Signed)
Spoke with patient who has already had her CAC score scan (why she was referred to [place]). She understands need to see lipid clinic. Referral placed at this time.

## 2022-10-26 ENCOUNTER — Telehealth (HOSPITAL_COMMUNITY): Payer: Self-pay | Admitting: *Deleted

## 2022-10-26 NOTE — Telephone Encounter (Signed)
Reaching out to patient to offer assistance regarding upcoming cardiac imaging study; pt verbalizes understanding of appt date/time, parking situation and where to check in, pre-test NPO status verified current allergies; name and call back number provided for further questions should they arise  Gordy Clement RN Navigator Cardiac [place] [facility] [phone] office [phone] cell  Patient aware to arrive at 1:30pm.

## 2022-10-27 ENCOUNTER — Ambulatory Visit (HOSPITAL_COMMUNITY)
Admission: RE | Admit: 2022-10-27 | Discharge: 2022-10-27 | Disposition: A | Discharge status: Home / Self Care | Payer: No Typology Code available for payment source | Source: Ambulatory Visit | Attending: Cardiovascular Disease | Admitting: Cardiovascular Disease

## 2022-10-27 DIAGNOSIS — I2510 Atherosclerotic heart disease of native coronary artery without angina pectoris: Secondary | ICD-10-CM

## 2022-10-27 DIAGNOSIS — I209 Angina pectoris, unspecified: Secondary | ICD-10-CM | POA: Diagnosis not present

## 2022-10-27 DIAGNOSIS — I6529 Occlusion and stenosis of unspecified carotid artery: Secondary | ICD-10-CM | POA: Diagnosis not present

## 2022-10-27 DIAGNOSIS — E782 Mixed hyperlipidemia: Secondary | ICD-10-CM | POA: Insufficient documentation

## 2022-10-27 DIAGNOSIS — Q2112 Patent foramen ovale: Secondary | ICD-10-CM | POA: Insufficient documentation

## 2022-10-27 DIAGNOSIS — R0609 Other forms of dyspnea: Secondary | ICD-10-CM | POA: Diagnosis not present

## 2022-10-27 MED ORDER — METOPROLOL TARTRATE 5 MG/5ML IV SOLN
INTRAVENOUS | Status: AC
  Filled 2022-10-27: qty 10

## 2022-10-27 MED ORDER — NITROGLYCERIN 0.4 MG SL SUBL
0.8000 mg | SUBLINGUAL_TABLET | Freq: Once | SUBLINGUAL | Status: AC
  Administered 2022-10-27: 0.8 mg via SUBLINGUAL

## 2022-10-27 MED ORDER — METOPROLOL TARTRATE 5 MG/5ML IV SOLN
10.0000 mg | Freq: Once | INTRAVENOUS | Status: AC
  Administered 2022-10-27: 10 mg via INTRAVENOUS

## 2022-10-27 MED ORDER — NITROGLYCERIN 0.4 MG SL SUBL
SUBLINGUAL_TABLET | SUBLINGUAL | Status: AC
  Filled 2022-10-27: qty 2

## 2022-10-27 MED ORDER — IOHEXOL 350 MG/ML SOLN
95.0000 mL | Freq: Once | INTRAVENOUS | Status: AC | PRN
  Administered 2022-10-27: 95 mL via INTRAVENOUS

## 2022-11-02 ENCOUNTER — Telehealth: Payer: Self-pay | Admitting: Cardiovascular Disease

## 2022-11-02 DIAGNOSIS — E782 Mixed hyperlipidemia: Secondary | ICD-10-CM

## 2022-11-02 NOTE — Telephone Encounter (Signed)
Called and left detailed message for patient. Informed of referral placed to lipid clinic based on CT results and to please call or send MyChart message if she is continuing to have chest pain and should be seen sooner.

## 2022-11-02 NOTE — Telephone Encounter (Signed)
-----   Message from Thayer Headings, MD sent at 10/31/2022  8:44 AM EST ----- LDL is 142 LP(a) is 249 Coronary calcium score is 60.7 (93rd percentile of age / sex matched controls)  RCA has mild - moderate plaque LM - normal LAD - mild - moderate plaque LCx:  no plaque  Her CP does not appear to be coming from obstructive CAD She needs aggressive lipid lowering therapy  Please refer to the lipid clinic for consideration for PCSK9 inhibitor  She has an appt to see me March 22.   If she is continuing to have CP, please schedule an appt to see me sooner

## 2022-12-07 NOTE — Progress Notes (Addendum)
Patient ID: Sydney [name]                 DOB: [birth year]                    MRN: [id number]     HPI: Sydney [name] is a 57 y.o. female patient referred to lipid clinic by Dr Acie Fredrickson. PMH is significant for coronary calcifications, HLD, DVT, coronary CTA 09/2022 with CAC of 60.7 (93rd percentile for age and gender matched controls), sagital sinus thrombosis and stroke following COVID vaccine, and long COVID.  Pt has taken atorvastatin for her cholesterol in the past. Initially placed on 80mg  daily after her stroke. Experienced myalgias in her legs, stopped med and pain improved. Her PCP tried her on a lower 10mg  dose however this caused similar side effects. Has not tried other medication for her cholesterol before.  Mentions her insurance has not been covering Pradaxa so far this year. Started on this after COVID related thrombosis. Follows with Dr Marin Olp, dose recently decreased to 75mg  BID however due to high cost at pharmacy, pt has been using some of her 150mg  capsules and taking once daily. We discussed Pradaxa is most effective when taken twice daily.   Current Medications: none Intolerances: atorvastatin 10mg  and 80mg  daily - myalgias in her legs, joint pain Risk Factors: stroke, elevated CAC, elevated Lp(a) LDL goal: 55mg /dL  Exercise: Used to run marathons. Has had issues passing out after spin classes recently. Walks 6 days a week.  Family History: Non contributory.  Social History: No tobacco, alcohol or drug use. 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  Labs: 10/13/22: TC 220, TG 65, HDL 67, LDL 142, Lp(a) 249, LFTs normal (no LLT)  Past Medical History:  Diagnosis Date   Anemia    Blood clots in brain    COVID    COVID-19    Sept 2022   Headache    History of cold sores    HPV (human papilloma virus) infection    Post-operative nausea and vomiting    Stroke [facility])     Current Outpatient Medications on File Prior to Visit   Medication Sig Dispense Refill   Biotin 5 MG TABS Take 5 mg by mouth at bedtime. Chewable     dabigatran (PRADAXA) 75 MG CAPS capsule Take 1 capsule (75 mg total) by mouth 2 (two) times daily. 60 capsule 12   metoprolol tartrate (LOPRESSOR) 25 MG tablet Take 1 tablet by mouth two hours prior to scan (Patient not taking: Reported on 10/18/2022) 1 tablet 0   Multiple Vitamin (MULTIVITAMIN) tablet Take 1 tablet by mouth daily.     valACYclovir (VALTREX) 500 MG tablet Take 500 mg by mouth 2 (two) times daily as needed.     No current facility-administered medications on file prior to visit.    Allergies  Allergen Reactions   Covid-19 (Mrna) Vaccine Anaphylaxis    BLOOD CLOTS   Doxycycline Nausea And Vomiting    Assessment/Plan:  1. Hyperlipidemia - Baseline LDL 142 above goal < 55 given hx of stroke, elevated calcium score, and elevated Lp(a). Pt is intolerant to multiple doses of atorvastatin. Has not tried other cholesterol meds. Discussed statin rechallenge vs Repatha. Insurance requires 2nd statin trial before covering [place]. Will start low dose of rosuvastatin 5mg  daily which pt should hopefully tolerate better. Will recheck lipids at visit with Dr Acie Fredrickson next month. She will call me if she experiences any side effects  on rosuvastatin, and will pursue Repatha at that time which would provided added 25% Lp(a) lowering.  2. Anticoagulation - Her insurance isn't covering Pradaxa this year. Started for cerebral venous thrombosis after COVID vaccine 09/2020 hospital admission by neuro. Their notes do mention that either Eliquis or Pradaxa were options. Dr Marin Olp has been managing rx since she stopped following with neuro. Advised pt to reach out to Dr Marin Olp to see if he feels comfortable with pt changing to Eliquis - insurance typically covers this better.  Sydney [name] E. Jaquarious Grey, PharmD, BCACP, [place]. [address], [place], [place] [address] Phone: [phone]; Fax: [phone] 12/08/2022 2:28 PM

## 2022-12-08 ENCOUNTER — Ambulatory Visit: Payer: No Typology Code available for payment source | Attending: Cardiology | Admitting: Pharmacist

## 2022-12-08 DIAGNOSIS — E782 Mixed hyperlipidemia: Secondary | ICD-10-CM

## 2022-12-08 DIAGNOSIS — E7841 Elevated Lipoprotein(a): Secondary | ICD-10-CM | POA: Diagnosis not present

## 2022-12-08 DIAGNOSIS — E785 Hyperlipidemia, unspecified: Secondary | ICD-10-CM | POA: Insufficient documentation

## 2022-12-08 MED ORDER — ROSUVASTATIN CALCIUM 5 MG PO TABS: 5.0000 mg | ORAL_TABLET | Freq: Every day | ORAL | 3 refills | Status: DC

## 2022-12-08 NOTE — Patient Instructions (Addendum)
Your LDL cholesterol is 142 and your goal is definitely < 70 and ideally < 55  Start rosuvastatin (Crestor) 56m once daily  Recheck cholesterol when you see Dr NAcie Fredricksonnext month  Call MJinny Blossom PharmD with any tolerability issues at #[phone] we can try Repatha next

## 2022-12-19 ENCOUNTER — Encounter: Payer: Self-pay | Admitting: Hematology & Oncology

## 2022-12-22 ENCOUNTER — Telehealth: Payer: Self-pay | Admitting: *Deleted

## 2022-12-22 ENCOUNTER — Other Ambulatory Visit: Payer: Self-pay | Admitting: *Deleted

## 2022-12-22 MED ORDER — APIXABAN 5 MG PO TABS: 5.0000 mg | ORAL_TABLET | Freq: Two times a day (BID) | ORAL | 3 refills | Status: DC

## 2022-12-22 NOTE — Telephone Encounter (Signed)
Received a call from patient stating that her coupon for Pradaxa ran out and it is not preferred with her insurance but Eliquis is.  Inquiring whether Dr Marin Olp can send Rx for Eliquis.  Dr Marin Olp is fine with this.  Rx sent to preferred pharmacy

## 2023-01-11 ENCOUNTER — Other Ambulatory Visit: Payer: No Typology Code available for payment source

## 2023-01-11 ENCOUNTER — Ambulatory Visit: Payer: No Typology Code available for payment source | Admitting: Hematology & Oncology

## 2023-01-12 ENCOUNTER — Encounter: Payer: Self-pay | Admitting: Family

## 2023-01-12 ENCOUNTER — Inpatient Hospital Stay: Payer: No Typology Code available for payment source | Attending: Hematology & Oncology

## 2023-01-12 ENCOUNTER — Inpatient Hospital Stay (HOSPITAL_BASED_OUTPATIENT_CLINIC_OR_DEPARTMENT_OTHER): Payer: No Typology Code available for payment source | Admitting: Family

## 2023-01-12 VITALS — BP 132/84 | HR 55 | Temp 99.1°F | Resp 17 | Wt 150.0 lb

## 2023-01-12 DIAGNOSIS — D500 Iron deficiency anemia secondary to blood loss (chronic): Secondary | ICD-10-CM

## 2023-01-12 DIAGNOSIS — G08 Intracranial and intraspinal phlebitis and thrombophlebitis: Secondary | ICD-10-CM

## 2023-01-12 DIAGNOSIS — D509 Iron deficiency anemia, unspecified: Secondary | ICD-10-CM | POA: Diagnosis present

## 2023-01-12 DIAGNOSIS — D6859 Other primary thrombophilia: Secondary | ICD-10-CM | POA: Diagnosis not present

## 2023-01-12 DIAGNOSIS — Z7901 Long term (current) use of anticoagulants: Secondary | ICD-10-CM

## 2023-01-12 LAB — CBC WITH DIFFERENTIAL (CANCER CENTER ONLY)
Abs Immature Granulocytes: 0.01 10*3/uL (ref 0.00–0.07)
Basophils Absolute: 0 10*3/uL (ref 0.0–0.1)
Basophils Relative: 1 %
Eosinophils Absolute: 0.1 10*3/uL (ref 0.0–0.5)
Eosinophils Relative: 1 %
HCT: 40.1 % (ref 36.0–46.0)
Hemoglobin: 13.6 g/dL (ref 12.0–15.0)
Immature Granulocytes: 0 %
Lymphocytes Relative: 33 %
Lymphs Abs: 1.4 10*3/uL (ref 0.7–4.0)
MCH: 31.5 pg (ref 26.0–34.0)
MCHC: 33.9 g/dL (ref 30.0–36.0)
MCV: 92.8 fL (ref 80.0–100.0)
Monocytes Absolute: 0.3 10*3/uL (ref 0.1–1.0)
Monocytes Relative: 6 %
Neutro Abs: 2.5 10*3/uL (ref 1.7–7.7)
Neutrophils Relative %: 59 %
Platelet Count: 249 10*3/uL (ref 150–400)
RBC: 4.32 MIL/uL (ref 3.87–5.11)
RDW: 12 % (ref 11.5–15.5)
WBC Count: 4.3 10*3/uL (ref 4.0–10.5)
nRBC: 0 % (ref 0.0–0.2)

## 2023-01-12 LAB — RETICULOCYTES
Immature Retic Fract: 6.8 % (ref 2.3–15.9)
RBC.: 4.32 MIL/uL (ref 3.87–5.11)
Retic Count, Absolute: 51.8 10*3/uL (ref 19.0–186.0)
Retic Ct Pct: 1.2 % (ref 0.4–3.1)

## 2023-01-12 LAB — CMP (CANCER CENTER ONLY)
ALT: 27 U/L (ref 0–44)
AST: 30 U/L (ref 15–41)
Albumin: 4.8 g/dL (ref 3.5–5.0)
Alkaline Phosphatase: 88 U/L (ref 38–126)
Anion gap: 8 (ref 5–15)
BUN: 16 mg/dL (ref 6–20)
CO2: 28 mmol/L (ref 22–32)
Calcium: 10.5 mg/dL — ABNORMAL HIGH (ref 8.9–10.3)
Chloride: 101 mmol/L (ref 98–111)
Creatinine: 1.02 mg/dL — ABNORMAL HIGH (ref 0.44–1.00)
GFR, Estimated: >60 mL/min (ref >60)
Glucose, Bld: 87 mg/dL (ref 70–99)
Potassium: 3.7 mmol/L (ref 3.5–5.1)
Sodium: 137 mmol/L (ref 135–145)
Total Bilirubin: 0.7 mg/dL (ref 0.3–1.2)
Total Protein: 9 g/dL — ABNORMAL HIGH (ref 6.5–8.1)

## 2023-01-12 LAB — FERRITIN: Ferritin: 101 ng/mL (ref 11–307)

## 2023-01-12 LAB — D-DIMER, QUANTITATIVE: D-Dimer, Quant: <0.27 ug/mL-FEU (ref 0.00–0.50)

## 2023-01-12 NOTE — Progress Notes (Signed)
Hematology and Oncology Follow Up Visit  Sydney [name] [id number] [birth year] 57 y.o. 01/12/2023   Principle Diagnosis:  Cerebral venous sinus thrombosis  Chronic left jugular thombus Iron deficiency anemia     Current Therapy:        Eliquis 5 mg PO BID IV iron as indicated        Interim History:  Sydney [name] is here today for follow-up. She is doing well and has been able to continue walking for exercise.  She states that if she over exerts herself she will note dizziness, palpitations and SOB. She takes a break to rest when needed.  She states that cardiology has put her heart cath on hold for now.  Since changing to Eliquis 5 mg PO daily she has noted some bleeding from her gums when brushing her teeth that resolves once she stops brushing. No other blood loss noted.  No abnormal bruising, no petechiae.  No fever, chills, n/v, cough, rash, dizziness, SOB, chest pain, palpitations, abdominal pain or changes in bowel or bladder habits.  No swelling or tenderness in her extremities.  No falls or syncope.  Appetite and hydration are good. Weight is stable at 150 lbs.   ECOG Performance Status: 1 - Symptomatic but completely ambulatory  Medications:  Allergies as of 01/12/2023       Reactions   Covid-19 (mrna) Vaccine Anaphylaxis   BLOOD CLOTS   Doxycycline Nausea And Vomiting        Medication List        Accurate as of January 12, 2023  2:22 PM. If you have any questions, ask your nurse or doctor.          apixaban 5 MG Tabs tablet Commonly known as: ELIQUIS Take 1 tablet (5 mg total) by mouth 2 (two) times daily.   Biotin 5 MG Tabs Take 5 mg by mouth at bedtime. Chewable   dabigatran 75 MG Caps capsule Commonly known as: Pradaxa Take 1 capsule (75 mg total) by mouth 2 (two) times daily.   multivitamin tablet Take 1 tablet by mouth daily.   rosuvastatin 5 MG tablet Commonly known as: CRESTOR Take 1 tablet (5 mg total) by mouth daily.    valACYclovir 500 MG tablet Commonly known as: VALTREX Take 500 mg by mouth 2 (two) times daily as needed.        Allergies:  Allergies  Allergen Reactions   Covid-19 (Mrna) Vaccine Anaphylaxis    BLOOD CLOTS   Doxycycline Nausea And Vomiting    Past Medical History, Surgical history, Social history, and Family History were reviewed and updated.  Review of Systems: All other 10 point review of systems is negative.   Physical Exam:  weight is 150 lb (68 kg). Her oral temperature is 99.1 F (37.3 C). Her blood pressure is 132/84 and her pulse is 55 (abnormal). Her respiration is 17 and oxygen saturation is 100%.   Wt Readings from Last 3 Encounters:  01/12/23 150 lb (68 kg)  10/18/22 150 lb 12.8 oz (68.4 kg)  10/13/22 150 lb (68 kg)    Ocular: Sclerae unicteric, pupils equal, round and reactive to light Ear-nose-throat: Oropharynx clear, dentition fair Lymphatic: No cervical or supraclavicular adenopathy Lungs no rales or rhonchi, good excursion bilaterally Heart regular rate and rhythm, no murmur appreciated Abd soft, nontender, positive bowel sounds MSK no focal spinal tenderness, no joint edema Neuro: non-focal, well-oriented, appropriate affect Breasts: Deferred   Lab Results  Component Value Date   WBC 4.3  01/12/2023   HGB 13.6 01/12/2023   HCT 40.1 01/12/2023   MCV 92.8 01/12/2023   PLT 249 01/12/2023   Lab Results  Component Value Date   FERRITIN 99 06/15/2022   IRON 91 06/15/2022   TIBC 273 06/15/2022   UIBC 182 06/15/2022   IRONPCTSAT 33 (H) 06/15/2022   Lab Results  Component Value Date   RETICCTPCT 1.2 01/12/2023   RBC 4.32 01/12/2023   No results found for: "KPAFRELGTCHN", "LAMBDASER", "KAPLAMBRATIO" No results found for: "IGGSERUM", "IGA", "IGMSERUM" Lab Results  Component Value Date   ALBUMINELP 3.8 09/28/2020   A1GS 0.5 (H) 09/28/2020   A2GS 0.9 09/28/2020   BETS 0.6 09/28/2020   BETA2SER 0.4 09/28/2020   GAMS 1.7 09/28/2020   SPEI   09/28/2020     Comment:     . Alpha-1 globulin increase noted. .      Chemistry      Component Value Date/Time   NA 137 01/12/2023 1341   NA 140 10/13/2022 1421   K 3.7 01/12/2023 1341   CL 101 01/12/2023 1341   CO2 28 01/12/2023 1341   BUN 16 01/12/2023 1341   BUN 12 10/13/2022 1421   CREATININE 1.02 (H) 01/12/2023 1341   CREATININE 1.29 (H) 11/12/2020 1557      Component Value Date/Time   CALCIUM 10.5 (H) 01/12/2023 1341   ALKPHOS 88 01/12/2023 1341   AST 30 01/12/2023 1341   ALT 27 01/12/2023 1341   BILITOT 0.7 01/12/2023 1341       Impression and Plan: Sydney [name] is a very pleasant 57 yo caucasian female with diagnosis of nonocclusive thrombus within the mid superior sagittal sinus extending posteriorly, left sigmoid sinus and visualized left internal jugular vein.  This all occurred after she had a COVID vaccine.  She is tolerating Eliquis and taking as prescribed. She will let us know if she has any concerns.  Iron studies are pending. Hgb is stable at 13.6, MCV 92.  Follow-up in 6 months.   Lottie Dawson, NP 3/15/20242:22 PM

## 2023-01-15 ENCOUNTER — Ambulatory Visit: Payer: No Typology Code available for payment source | Admitting: Cardiovascular Disease

## 2023-01-15 LAB — IRON AND IRON BINDING CAPACITY (CC-WL,HP ONLY)
Iron: 93 ug/dL (ref 28–170)
Saturation Ratios: 30 % (ref 10.4–31.8)
TIBC: 312 ug/dL (ref 250–450)
UIBC: 219 ug/dL (ref 148–442)

## 2023-01-18 ENCOUNTER — Encounter: Payer: Self-pay | Admitting: Cardiovascular Disease

## 2023-01-18 NOTE — Progress Notes (Unsigned)
Cardiology Office Note:    Date:  01/20/2023   ID:  Sydney [name], Sydney [name], Sydney [name], MRN [id number]  PCP:  Lois Huxley, [place] Providers Cardiologist:  Almee Pelphrey    Referring MD: Lois Huxley, [place]   Chief Complaint  Patient presents with   Shortness of Breath        Hyperlipidemia    History of Present Illness:    [name] is a 57 y.o. female with a hx of coronary calcifications, hyperlipidemia , DVT and sagital sinus thrombosis following COVID 19 vaccine .  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   Had a severe reaction to her COVID booster , eventually had a stroke   She was recently was found to have coronary calcifications  CAC is 58,  94 th % for age and gender matched controls  Was very active,  doing obstacle course races previously , ran marathons, spartan races,   Now is not able to walk. Has syncope or N/V with any intense workouts   Has been treated for long COVID ,   She has coronary artery calcifications Has tried atorvastatin at multiple doses, has severe myalgias with all statin doses.   Had a bad reaction to her last flu vaccine .   No CP ,  has dyspnea   Does not know her parents   January 19, 2023 Sydney [name] is seen for follow up of her dyspnea, CAC Hx of stroke after COVID vaccine   COR CTA : CAC score is 60.7 93rd percentile for age / sex matched controls  RCA :  mild - mod plaque LAD : mild - mod plaque LCx:  normal   Her LDL goal is < 55 - her last LDL was 142 She has been seen in the lipid clinic  She is still having muscle aches with the Rosuvastatin and atorvastatin  Wt is 148 lbs ( down 2 lbs )    Is exercising some .  Walks at lunch.         Past Medical History:  Diagnosis Date   Anemia    Blood clots in brain    COVID    COVID-19    Sept 2022   Headache    History of cold sores    HPV (human papilloma virus) infection    Post-operative nausea  and vomiting    Stroke [facility])     Past Surgical History:  Procedure Laterality Date   BLEPHAROPLASTY  2005   CESAREAN SECTION  2002    Current Medications: Current Meds  Medication Sig   apixaban (ELIQUIS) 5 MG TABS tablet Take 1 tablet (5 mg total) by mouth 2 (two) times daily.   Biotin 5 MG TABS Take 5 mg by mouth at bedtime. Chewable   Multiple Vitamin (MULTIVITAMIN) tablet Take 1 tablet by mouth daily.   rosuvastatin (CRESTOR) 5 MG tablet Take 1 tablet (5 mg total) by mouth daily.   valACYclovir (VALTREX) 500 MG tablet Take 500 mg by mouth 2 (two) times daily as needed.   [DISCONTINUED] dabigatran (PRADAXA) 75 MG CAPS capsule Take 1 capsule (75 mg total) by mouth 2 (two) times daily.     Allergies:   Covid-19 (mrna) vaccine and Doxycycline   Social History   Socioeconomic History   Marital status: Divorced    Spouse name: Not on file   Number of children: Not on file   Years of  education: Not on file   Highest education level: Not on file  Occupational History   Occupation: Therapist, sports at [employer]   Smoking status: Never   Smokeless tobacco: Never  Vaping Use   Vaping Use: Never used  Substance and Sexual Activity   Alcohol use: Never   Drug use: Never   Sexual activity: Not Currently  Other Topics Concern   Not on file  Social History Narrative   Lives with son   Right Handed   Drinks no caffeine   Social Determinants of Health   Financial Resource Strain: Not on file  Food Insecurity: Not on file  Transportation Needs: Not on file  Physical Activity: Not on file  Stress: Not on file  Social Connections: Not on file     Family History: The patient's family history includes Lung cancer in her maternal grandfather and maternal grandmother. There is no history of Colon cancer, Colon polyps, Esophageal cancer, Rectal cancer, or Stomach cancer.  ROS:   Please see the history of present illness.     All other systems reviewed and are  negative.  EKGs/Labs/Other Studies Reviewed:    The following studies were reviewed today:   EKG:    Recent Labs: 3/[name]/2024: BUN 16; Creatinine 1.02; Hemoglobin 13.6; Platelet Count 249; Potassium 3.7; Sodium 137 01/19/2023: ALT 25  Recent Lipid Panel    Component Value Date/Time   CHOL 177 01/19/2023 1401   TRIG 56 01/19/2023 1401   HDL 75 01/19/2023 1401   CHOLHDL 2.4 01/19/2023 1401   CHOLHDL 3.0 08/27/2020 1433   VLDL 11.2 08/20/2019 1536   LDLCALC 91 01/19/2023 1401   LDLCALC 143 (H) 08/27/2020 1433     Risk Assessment/Calculations:                Physical Exam:    Physical Exam: Blood pressure 110/80, pulse 70, height 5\' 2"  (1.575 m), weight 148 lb 6.4 oz (67.3 kg), SpO2 98 %.       GEN:  Well nourished, well developed in no acute distress HEENT: Normal NECK: No JVD; No carotid bruits LYMPHATICS: No lymphadenopathy CARDIAC: RRR , no murmurs, rubs, gallops RESPIRATORY:  Clear to auscultation without rales, wheezing or rhonchi  ABDOMEN: Soft, non-tender, non-distended MUSCULOSKELETAL:  No edema; No deformity  SKIN: Warm and dry NEUROLOGIC:  Alert and oriented x 3   ASSESSMENT:    1. Coronary artery calcification   2. Mixed hyperlipidemia     PLAN:       Shortness of breath with exertion: She has had shortness of breath with exertion since her stroke following her COVID vaccine.  Has had multiple complications since her COVID booster.  She wonders whether these are possibly symptoms of long COVID.      2.  Hyperlipidemia:  she had labs drawn today .   She is not tolerating the rosuvastatin 5 mg  Will review labs. Will likely need to get back to the lipid clinic for PCSK9 inhibitor     Medication Adjustments/Labs and Tests Ordered: Current medicines are reviewed at length with the patient today.  Concerns regarding medicines are outlined above.  No orders of the defined types were placed in this encounter.  No orders of the defined types  were placed in this encounter.   Patient Instructions  Medication Instructions:  Your physician recommends that you continue on your current medications as directed. Please refer to the Current Medication list given to you today. *If you need a  refill on your cardiac medications before your next appointment, please call your pharmacy*   Follow-Up: At [facility], you and your health needs are our priority.  As part of our continuing mission to provide you with exceptional heart care, we have created designated Provider Care Teams.  These Care Teams include your primary Cardiologist (physician) and Advanced Practice Providers (APPs -  Physician Assistants and Nurse Practitioners) who all work together to provide you with the care you need, when you need it.  We recommend signing up for the patient portal called "MyChart".  Sign up information is provided on this After Visit Summary.  MyChart is used to connect with patients for Virtual Visits (Telemedicine).  Patients are able to view lab/test results, encounter notes, upcoming appointments, etc.  Non-urgent messages can be sent to your provider as well.   To learn more about what you can do with MyChart, go to [email or web address].    Your next appointment:   6 month(s)  Provider:   Mertie Moores, MD     Signed, Mertie Moores, MD  01/20/2023 8:13 AM    [place]

## 2023-01-19 ENCOUNTER — Ambulatory Visit: Payer: No Typology Code available for payment source

## 2023-01-19 ENCOUNTER — Encounter: Payer: Self-pay | Admitting: Cardiovascular Disease

## 2023-01-19 ENCOUNTER — Ambulatory Visit
Payer: No Typology Code available for payment source | Attending: Cardiovascular Disease | Admitting: Cardiovascular Disease

## 2023-01-19 VITALS — BP 110/80 | HR 70 | Ht 62.0 in | Wt 148.4 lb

## 2023-01-19 DIAGNOSIS — I2510 Atherosclerotic heart disease of native coronary artery without angina pectoris: Secondary | ICD-10-CM

## 2023-01-19 DIAGNOSIS — I2584 Coronary atherosclerosis due to calcified coronary lesion: Secondary | ICD-10-CM | POA: Diagnosis not present

## 2023-01-19 DIAGNOSIS — E782 Mixed hyperlipidemia: Secondary | ICD-10-CM | POA: Diagnosis not present

## 2023-01-19 NOTE — Patient Instructions (Signed)
Medication Instructions:  Your physician recommends that you continue on your current medications as directed. Please refer to the Current Medication list given to you today. *If you need a refill on your cardiac medications before your next appointment, please call your pharmacy*   Follow-Up: At [facility], you and your health needs are our priority.  As part of our continuing mission to provide you with exceptional heart care, we have created designated Provider Care Teams.  These Care Teams include your primary Cardiologist (physician) and Advanced Practice Providers (APPs -  Physician Assistants and Nurse Practitioners) who all work together to provide you with the care you need, when you need it.  We recommend signing up for the patient portal called "MyChart".  Sign up information is provided on this After Visit Summary.  MyChart is used to connect with patients for Virtual Visits (Telemedicine).  Patients are able to view lab/test results, encounter notes, upcoming appointments, etc.  Non-urgent messages can be sent to your provider as well.   To learn more about what you can do with MyChart, go to [email or web address].    Your next appointment:   6 month(s)  Provider:   Mertie Moores, MD

## 2023-01-20 LAB — LIPID PANEL
Chol/HDL Ratio: 2.4 ratio (ref 0.0–4.4)
Cholesterol, Total: 177 mg/dL (ref 100–199)
HDL: 75 mg/dL (ref >39)
LDL Chol Calc (NIH): 91 mg/dL (ref 0–99)
Triglycerides: 56 mg/dL (ref 0–149)
VLDL Cholesterol Cal: 11 mg/dL (ref 5–40)

## 2023-01-20 LAB — HEPATIC FUNCTION PANEL
ALT: 25 IU/L (ref 0–32)
AST: 35 IU/L (ref 0–40)
Albumin: 4.8 g/dL (ref 3.8–4.9)
Alkaline Phosphatase: 108 IU/L (ref 44–121)
Bilirubin Total: 0.3 mg/dL (ref 0.0–1.2)
Bilirubin, Direct: 0.12 mg/dL (ref 0.00–0.40)
Total Protein: 8.7 g/dL — ABNORMAL HIGH (ref 6.0–8.5)

## 2023-01-22 ENCOUNTER — Telehealth: Payer: Self-pay | Admitting: Pharmacist

## 2023-01-22 DIAGNOSIS — I2510 Atherosclerotic heart disease of native coronary artery without angina pectoris: Secondary | ICD-10-CM

## 2023-01-22 DIAGNOSIS — E782 Mixed hyperlipidemia: Secondary | ICD-10-CM

## 2023-01-22 NOTE — Telephone Encounter (Signed)
Pt previously intolerant to atorvastatin 10mg  and 80mg  daily (myalgias and joint pain). I started her on rosuvastatin 5mg  daily on 12/08/22. Saw Dr Acie Fredrickson for follow up 01/19/23 and reported muscle aches on rosuvastatin. Her insurance required trial of 2 statins before covering PCSK9i. Since she is now intolerant to 2 statins including lowest starting dose of a hydrophilic statin, will submit prior auth for Repatha. Her baseline LDL is 142 and her goal is < 55 given her hx of stroke, elevated calcium score, and elevated Lp(a). Repatha will help to lower both her LDL and her Lp(a). Repatha PA submitted, Key: [id number].  Called pt, no answer, left message. Will clarify if she has stopped her rosuvastatin as well, will need med removed from list if so.

## 2023-01-22 NOTE — Telephone Encounter (Signed)
Spoke with pt, she is in agreement with trying Repatha. She stopped taking her rosuvastatin after cards visit on Friday, med and allergy list have been updated.

## 2023-01-23 ENCOUNTER — Encounter: Payer: Self-pay | Admitting: Pharmacist

## 2023-01-23 MED ORDER — REPATHA SURECLICK 140 MG/ML ~~LOC~~ SOAJ: 1.0000 | SUBCUTANEOUS | 3 refills | Status: DC

## 2023-01-23 NOTE — Telephone Encounter (Signed)
PA request approved through 07/25/23. Rx sent to pharmacy, $5 copay card info sent to pt via mychart.  RxBin: [id number] RxPCN: [id number] RxGrp: [id number] ID: [id number]

## 2023-02-09 ENCOUNTER — Other Ambulatory Visit: Payer: Self-pay | Admitting: Physician Assistant

## 2023-02-09 ENCOUNTER — Ambulatory Visit
Admission: RE | Admit: 2023-02-09 | Discharge: 2023-02-09 | Disposition: A | Discharge status: Home / Self Care | Payer: No Typology Code available for payment source | Source: Ambulatory Visit | Attending: Physician Assistant | Admitting: Physician Assistant

## 2023-02-09 DIAGNOSIS — R059 Cough, unspecified: Secondary | ICD-10-CM

## 2023-04-17 ENCOUNTER — Other Ambulatory Visit: Payer: Self-pay | Admitting: Hematology & Oncology

## 2023-06-15 LAB — COMPREHENSIVE METABOLIC PANEL
Calcium: 9.9
EGFR: 61

## 2023-06-15 LAB — TSH: TSH: 2.08 (ref 0.41–5.90)

## 2023-06-18 ENCOUNTER — Encounter: Payer: Self-pay | Admitting: Family Medicine

## 2023-06-25 ENCOUNTER — Telehealth: Payer: Self-pay | Admitting: Pharmacist

## 2023-06-25 MED ORDER — REPATHA SURECLICK 140 MG/ML ~~LOC~~ SOAJ: 1.0000 | SUBCUTANEOUS | 3 refills | Status: DC

## 2023-06-25 NOTE — Addendum Note (Signed)
Addended by: Debraann Livingstone E on: 06/25/2023 12:47 PM   Modules accepted: Orders

## 2023-06-25 NOTE — Telephone Encounter (Signed)
Pt called clinic, states hasn't started on Repatha due to fear (had stroke after COVID vaccine). Had lipids checked recently at PCP office and LDL still high. Advised pt there is no data suggesting increased risk of thrombosis or stroke with Repatha, and that Repatha actually helps to reduce the risk of stroke. She is agreeable to start. Provided her with Repatha copay card # to call if needed  (previously had card set up in March but not sure if insurance data hack happened after that in which case some of the #s on the card changed).

## 2023-07-06 ENCOUNTER — Encounter: Payer: Self-pay | Admitting: Family

## 2023-07-06 ENCOUNTER — Inpatient Hospital Stay: Payer: No Typology Code available for payment source | Attending: Hematology & Oncology

## 2023-07-06 ENCOUNTER — Inpatient Hospital Stay (HOSPITAL_BASED_OUTPATIENT_CLINIC_OR_DEPARTMENT_OTHER): Payer: No Typology Code available for payment source | Admitting: Family

## 2023-07-06 VITALS — BP 117/65 | HR 69 | Temp 98.4°F | Resp 17 | Wt 151.8 lb

## 2023-07-06 DIAGNOSIS — I82C22 Chronic embolism and thrombosis of left internal jugular vein: Secondary | ICD-10-CM | POA: Diagnosis not present

## 2023-07-06 DIAGNOSIS — G08 Intracranial and intraspinal phlebitis and thrombophlebitis: Secondary | ICD-10-CM | POA: Diagnosis present

## 2023-07-06 DIAGNOSIS — Z7901 Long term (current) use of anticoagulants: Secondary | ICD-10-CM | POA: Insufficient documentation

## 2023-07-06 DIAGNOSIS — D500 Iron deficiency anemia secondary to blood loss (chronic): Secondary | ICD-10-CM

## 2023-07-06 DIAGNOSIS — D509 Iron deficiency anemia, unspecified: Secondary | ICD-10-CM | POA: Diagnosis not present

## 2023-07-06 DIAGNOSIS — D6859 Other primary thrombophilia: Secondary | ICD-10-CM | POA: Diagnosis not present

## 2023-07-06 LAB — IRON AND IRON BINDING CAPACITY (CC-WL,HP ONLY)
Iron: 80 ug/dL (ref 28–170)
Saturation Ratios: 29 % (ref 10.4–31.8)
TIBC: 280 ug/dL (ref 250–450)
UIBC: 200 ug/dL (ref 148–442)

## 2023-07-06 LAB — CMP (CANCER CENTER ONLY)
ALT: 12 U/L (ref 0–44)
AST: 22 U/L (ref 15–41)
Albumin: 4.3 g/dL (ref 3.5–5.0)
Alkaline Phosphatase: 81 U/L (ref 38–126)
Anion gap: 7 (ref 5–15)
BUN: 15 mg/dL (ref 6–20)
CO2: 29 mmol/L (ref 22–32)
Calcium: 10 mg/dL (ref 8.9–10.3)
Chloride: 101 mmol/L (ref 98–111)
Creatinine: 1.09 mg/dL — ABNORMAL HIGH (ref 0.44–1.00)
GFR, Estimated: >60 mL/min (ref >60)
Glucose, Bld: 128 mg/dL — ABNORMAL HIGH (ref 70–99)
Potassium: 3.9 mmol/L (ref 3.5–5.1)
Sodium: 137 mmol/L (ref 135–145)
Total Bilirubin: 0.4 mg/dL (ref 0.3–1.2)
Total Protein: 8 g/dL (ref 6.5–8.1)

## 2023-07-06 LAB — CBC WITH DIFFERENTIAL (CANCER CENTER ONLY)
Abs Immature Granulocytes: 0.01 10*3/uL (ref 0.00–0.07)
Basophils Absolute: 0 10*3/uL (ref 0.0–0.1)
Basophils Relative: 1 %
Eosinophils Absolute: 0 10*3/uL (ref 0.0–0.5)
Eosinophils Relative: 1 %
HCT: 38.4 % (ref 36.0–46.0)
Hemoglobin: 12.5 g/dL (ref 12.0–15.0)
Immature Granulocytes: 0 %
Lymphocytes Relative: 40 %
Lymphs Abs: 1.4 10*3/uL (ref 0.7–4.0)
MCH: 30.1 pg (ref 26.0–34.0)
MCHC: 32.6 g/dL (ref 30.0–36.0)
MCV: 92.5 fL (ref 80.0–100.0)
Monocytes Absolute: 0.2 10*3/uL (ref 0.1–1.0)
Monocytes Relative: 6 %
Neutro Abs: 1.9 10*3/uL (ref 1.7–7.7)
Neutrophils Relative %: 52 %
Platelet Count: 215 10*3/uL (ref 150–400)
RBC: 4.15 MIL/uL (ref 3.87–5.11)
RDW: 11.9 % (ref 11.5–15.5)
WBC Count: 3.7 10*3/uL — ABNORMAL LOW (ref 4.0–10.5)
nRBC: 0 % (ref 0.0–0.2)

## 2023-07-06 LAB — RETICULOCYTES
Immature Retic Fract: 4.4 % (ref 2.3–15.9)
RBC.: 4.07 MIL/uL (ref 3.87–5.11)
Retic Count, Absolute: 48.4 10*3/uL (ref 19.0–186.0)
Retic Ct Pct: 1.2 % (ref 0.4–3.1)

## 2023-07-06 LAB — FERRITIN: Ferritin: 96 ng/mL (ref 11–307)

## 2023-07-06 LAB — D-DIMER, QUANTITATIVE: D-Dimer, Quant: <0.27 ug{FEU}/mL (ref 0.00–0.50)

## 2023-07-06 NOTE — Progress Notes (Signed)
Hematology and Oncology Follow Up Visit  Sydney [name] [id number] 22-Oct-1966 57 y.o. 07/06/2023   Principle Diagnosis:  Cerebral venous sinus thrombosis  Chronic left jugular thombus Iron deficiency anemia     Current Therapy:        Eliquis 5 mg PO BID IV iron as indicated     Interim History:  Sydney [name] is here today for follow-up. She is doing well but still has mild SOB, dizziness and head pressure when over exerting herself.  She has found that jogging and running are triggering her symptoms so she is walking for exercise.  No falls or syncope reported.  No changes in or loss of vision.  She is tolerating Eliquis nicely and taking as prescribed.  No blood loss, abnormal bruising or petechiae reported.  No fever, chills, n/v, cough, rash, SOB, chest pain, palpitations, abdominal pain or changes in bowel or bladder habits.  No swelling in her extremities.  Appetite and hydration are good at this time. Weight is 151 lbs.   ECOG Performance Status: 1 - Symptomatic but completely ambulatory  Medications:  Allergies as of 07/06/2023       Reactions   Covid-19 (mrna) Vaccine Anaphylaxis   BLOOD CLOTS   Doxycycline Nausea And Vomiting   Atorvastatin    Muscle and joint pain on 10mg  and 80mg  dose   Rosuvastatin    Muscle pain on 5mg  dose        Medication List        Accurate as of July 06, 2023  2:25 PM. If you have any questions, ask your nurse or doctor.          Biotin 5 MG Tabs Take 5 mg by mouth at bedtime. Chewable   Eliquis 5 MG Tabs tablet Generic drug: apixaban Take 1 tablet (5 mg total) by mouth 2 (two) times daily.   multivitamin tablet Take 1 tablet by mouth daily.   Repatha SureClick 140 MG/ML Soaj Generic drug: Evolocumab Inject 140 mg into the skin every 14 (fourteen) days.   valACYclovir 500 MG tablet Commonly known as: VALTREX Take 500 mg by mouth 2 (two) times daily as needed.        Allergies:  Allergies  Allergen  Reactions   Covid-19 (Mrna) Vaccine Anaphylaxis    BLOOD CLOTS   Doxycycline Nausea And Vomiting   Atorvastatin     Muscle and joint pain on 10mg  and 80mg  dose   Rosuvastatin     Muscle pain on 5mg  dose    Past Medical History, Surgical history, Social history, and Family History were reviewed and updated.  Review of Systems: All other 10 point review of systems is negative.   Physical Exam:  weight is 151 lb 12.8 oz (68.9 kg). Her oral temperature is 98.4 F (36.9 C). Her blood pressure is 117/65 and her pulse is 69. Her respiration is 17 and oxygen saturation is 99%.   Wt Readings from Last 3 Encounters:  07/06/23 151 lb 12.8 oz (68.9 kg)  01/19/23 148 lb 6.4 oz (67.3 kg)  01/12/23 150 lb (68 kg)    Ocular: Sclerae unicteric, pupils equal, round and reactive to light Ear-nose-throat: Oropharynx clear, dentition fair Lymphatic: No cervical or supraclavicular adenopathy Lungs no rales or rhonchi, good excursion bilaterally Heart regular rate and rhythm, no murmur appreciated Abd soft, nontender, positive bowel sounds MSK no focal spinal tenderness, no joint edema Neuro: non-focal, well-oriented, appropriate affect Breasts: Deferred   Lab Results  Component Value Date  WBC 3.7 (L) 07/06/2023   HGB 12.5 07/06/2023   HCT 38.4 07/06/2023   MCV 92.5 07/06/2023   PLT 215 07/06/2023   Lab Results  Component Value Date   FERRITIN 101 01/12/2023   IRON 93 01/12/2023   TIBC 312 01/12/2023   UIBC 219 01/12/2023   IRONPCTSAT 30 01/12/2023   Lab Results  Component Value Date   RETICCTPCT 1.2 07/06/2023   RBC 4.07 07/06/2023   No results found for: "KPAFRELGTCHN", "LAMBDASER", "KAPLAMBRATIO" No results found for: "IGGSERUM", "IGA", "IGMSERUM" Lab Results  Component Value Date   ALBUMINELP 3.8 09/28/2020   A1GS 0.5 (H) 09/28/2020   A2GS 0.9 09/28/2020   BETS 0.6 09/28/2020   BETA2SER 0.4 09/28/2020   GAMS 1.7 09/28/2020   SPEI  09/28/2020     Comment:      . Alpha-1 globulin increase noted. .      Chemistry      Component Value Date/Time   NA 137 07/06/2023 1319   NA 140 10/13/2022 1421   K 3.9 07/06/2023 1319   CL 101 07/06/2023 1319   CO2 29 07/06/2023 1319   BUN 15 07/06/2023 1319   BUN 12 10/13/2022 1421   CREATININE 1.09 (H) 07/06/2023 1319   CREATININE 1.29 (H) 11/12/2020 1557      Component Value Date/Time   CALCIUM 10.0 07/06/2023 1319   ALKPHOS 81 07/06/2023 1319   AST 22 07/06/2023 1319   ALT 12 07/06/2023 1319   BILITOT 0.4 07/06/2023 1319       Impression and Plan: Sydney [name] is a very pleasant 57 yo caucasian female with diagnosis of nonocclusive thrombus within the mid superior sagittal sinus extending posteriorly, left sigmoid sinus and visualized left internal jugular vein.  This all occurred after she had a COVID vaccine.  She is tolerating Eliquis and taking as prescribed. She will let us know if she has any concerns.  Iron studies are pending. Follow-up in 6 months.   Eileen Stanford, NP 9/6/20242:25 PM

## 2023-07-10 ENCOUNTER — Inpatient Hospital Stay: Payer: No Typology Code available for payment source

## 2023-07-10 ENCOUNTER — Ambulatory Visit: Payer: No Typology Code available for payment source | Admitting: Family

## 2023-07-13 ENCOUNTER — Ambulatory Visit: Payer: No Typology Code available for payment source | Admitting: Family

## 2023-07-13 ENCOUNTER — Inpatient Hospital Stay: Payer: No Typology Code available for payment source

## 2023-07-17 ENCOUNTER — Ambulatory Visit: Payer: No Typology Code available for payment source | Admitting: Cardiovascular Disease

## 2023-08-22 ENCOUNTER — Other Ambulatory Visit: Payer: Self-pay | Admitting: Hematology & Oncology

## 2023-08-23 ENCOUNTER — Telehealth: Payer: Self-pay | Admitting: Pharmacy Technician

## 2023-08-23 ENCOUNTER — Other Ambulatory Visit (HOSPITAL_COMMUNITY): Payer: Self-pay

## 2023-08-23 ENCOUNTER — Encounter: Payer: Self-pay | Admitting: Family

## 2023-08-23 NOTE — Telephone Encounter (Signed)
Pharmacy Patient Advocate Encounter   Received notification from CoverMyMeds that prior authorization for repatha is required/requested.   Insurance verification completed.   The patient is insured through [facility] .   Per test claim: PA required; PA submitted to [facility] via CoverMyMeds Key/confirmation #/EOC [facility] Status is pending

## 2023-08-23 NOTE — Telephone Encounter (Signed)
Pharmacy Patient Advocate Encounter  Received notification from [facility] [employer] that Prior Authorization for bidil has been APPROVED from 08/23/23 to 08/22/24   PA #/Case ID/Reference #: [id number]

## 2023-09-25 ENCOUNTER — Ambulatory Visit: Payer: No Typology Code available for payment source | Admitting: Cardiovascular Disease

## 2023-09-30 ENCOUNTER — Encounter: Payer: Self-pay | Admitting: Cardiovascular Disease

## 2023-09-30 NOTE — Progress Notes (Unsigned)
Cardiology Office Note:    Date:  10/01/2023   ID:  Sydney [name], Sydney [name] 1966/02/23, MRN [id number]  PCP:  Wilfrid Lund, PA   [facility] Providers Cardiologist:  Luiz Trumpower    Referring MD: Wilfrid Lund, [place]   Chief Complaint  Patient presents with   coronary artery calcification    History of Present Illness:    Sydney [name] "[name]" is a 57 y.o. female with a hx of coronary calcifications, hyperlipidemia , DVT and sagital sinus thrombosis following COVID 19 vaccine .  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   Had a severe reaction to her COVID booster , eventually had a stroke   She was recently was found to have coronary calcifications  CAC is 58,  94 th % for age and gender matched controls  Was very active,  doing obstacle course races previously , ran marathons, spartan races,   Now is not able to walk. Has syncope or N/V with any intense workouts   Has been treated for long COVID ,   She has coronary artery calcifications Has tried atorvastatin at multiple doses, has severe myalgias with all statin doses.   Had a bad reaction to her last flu vaccine .   No CP ,  has dyspnea   Does not know her parents   January 19, 2023 Sydney [name] is seen for follow up of her dyspnea, CAC Hx of stroke after COVID vaccine   COR CTA : CAC score is 60.7 93rd percentile for age / sex matched controls  RCA :  mild - mod plaque LAD : mild - mod plaque LCx:  normal   Her LDL goal is < 55 - her last LDL was 142 She has been seen in the lipid clinic  She is still having muscle aches with the Rosuvastatin and atorvastatin  Wt is 148 lbs ( down 2 lbs )    Is exercising some .  Walks at lunch.     Dec. 2,2024  Sydney [name] is seen for follow up of her CAC, HLD   Gets very short of breath with hiking .  Is not able to run any longer  Was running marathons prior to her stroke .  Remains very limited.  Echo shows normal LV function with  trivial valvular disease  Coronary CTA shows non obstructive CAD     Coronary CTA shows mild coronary irregularities   Will order a cardiopulmonary stress test to be performed in the advanced heart failure clinic.  Have discussed the case with Dr. Ilsa Iha in the infectious disease department.  Will refer on to Dr. Ilsa Iha to see if she may have a long COVID syndrome.      Past Medical History:  Diagnosis Date   Anemia    Blood clots in brain    COVID    COVID-19    Sept 2022   Headache    History of cold sores    HPV (human papilloma virus) infection    Post-operative nausea and vomiting    Stroke [facility])     Past Surgical History:  Procedure Laterality Date   BLEPHAROPLASTY  2005   CESAREAN SECTION  2002    Current Medications: Current Meds  Medication Sig   Biotin 5 MG TABS Take 5 mg by mouth at bedtime. Chewable   ELIQUIS 5 MG TABS tablet Take 1 tablet (5 mg total) by mouth 2 (two) times daily.  Evolocumab (REPATHA SURECLICK) 140 MG/ML SOAJ Inject 140 mg into the skin every 14 (fourteen) days.   Multiple Vitamin (MULTIVITAMIN) tablet Take 1 tablet by mouth daily.   valACYclovir (VALTREX) 500 MG tablet Take 500 mg by mouth 2 (two) times daily as needed.     Allergies:   Covid-19 (mrna) vaccine, Doxycycline, Atorvastatin, and Rosuvastatin   Social History   Socioeconomic History   Marital status: Divorced    Spouse name: Not on file   Number of children: Not on file   Years of education: Not on file   Highest education level: Not on file  Occupational History   Occupation: Charity fundraiser at [employer]  Tobacco Use   Smoking status: Never   Smokeless tobacco: Never  Vaping Use   Vaping status: Never Used  Substance and Sexual Activity   Alcohol use: Never   Drug use: Never   Sexual activity: Not Currently  Other Topics Concern   Not on file  Social History Narrative   Lives with son   Right Handed   Drinks no caffeine   Social Determinants of Health    Financial Resource Strain: Not on file  Food Insecurity: Not on file  Transportation Needs: Not on file  Physical Activity: Not on file  Stress: Not on file  Social Connections: Not on file     Family History: The patient's family history includes Lung cancer in her maternal grandfather and maternal grandmother. There is no history of Colon cancer, Colon polyps, Esophageal cancer, Rectal cancer, or Stomach cancer.  ROS:   Please see the history of present illness.     All other systems reviewed and are negative.  EKGs/Labs/Other Studies Reviewed:    The following studies were reviewed today:   EKG:    EKG Interpretation Date/Time:  Monday October 01 2023 16:31:32 EST Ventricular Rate:  59 PR Interval:  140 QRS Duration:  76 QT Interval:  432 QTC Calculation: 427 R Axis:   66  Text Interpretation: Sinus bradycardia with sinus arrhythmia When compared with ECG of 27-Mar-2022 15:50, No significant change was found Confirmed by Kristeen Miss [phone]) on 10/01/2023 5:15:35 PM     Recent Labs: 07/06/2023: ALT 12; BUN 15; Creatinine 1.09; Hemoglobin 12.5; Platelet Count 215; Potassium 3.9; Sodium 137  Recent Lipid Panel    Component Value Date/Time   CHOL 177 01/19/2023 1401   TRIG 56 01/19/2023 1401   HDL 75 01/19/2023 1401   CHOLHDL 2.4 01/19/2023 1401   CHOLHDL 3.0 08/27/2020 1433   VLDL 11.2 08/20/2019 1536   LDLCALC 91 01/19/2023 1401   LDLCALC 143 (H) 08/27/2020 1433     Risk Assessment/Calculations:                Physical Exam:     Physical Exam: Blood pressure 128/80, pulse 61, height 5\' 2"  (1.575 m), weight 146 lb 12.8 oz (66.6 kg), SpO2 98%.     GEN:  Well nourished, well developed in no acute distress HEENT: Normal NECK: No JVD; No carotid bruits LYMPHATICS: No lymphadenopathy CARDIAC: RRR , soft systolic outflow murmur RESPIRATORY:  Clear to auscultation without rales, wheezing or rhonchi  ABDOMEN: Soft, non-tender,  non-distended MUSCULOSKELETAL:  No edema; No deformity  SKIN: Warm and dry NEUROLOGIC:  Alert and oriented x 3    ASSESSMENT:    1. Coronary artery calcification   2. Long COVID      PLAN:       Shortness of breath with exertion: She has had  shortness of breath with exertion since her stroke following her COVID vaccine.  Has had multiple complications since her COVID booster.  She wonders whether these are possibly symptoms of long COVID.  Will have her see Dr. Drue Second to see if her symptoms are c/w Long COVID .  So far her echocardiogram has been unremarkable and her coronary CTA has also been unremarkable.  Will schedule her for a cardiopulmonary stress test for further relation of her shortness of breath.      2.  Hyperlipidemia:   stable     Medication Adjustments/Labs and Tests Ordered: Current medicines are reviewed at length with the patient today.  Concerns regarding medicines are outlined above.  Orders Placed This Encounter  Procedures   Cardiopulmonary exercise test   ALT   Basic metabolic panel   Lipid panel   Ambulatory referral to Infectious Disease   EKG 12-Lead   No orders of the defined types were placed in this encounter.   Patient Instructions  Medication Instructions:  Your physician recommends that you continue on your current medications as directed. Please refer to the Current Medication list given to you today.  *If you need a refill on your cardiac medications before your next appointment, please call your pharmacy*  Lab Work: TODAY: Lipid panel, ALT, BMP Please go to [facility] Office on 1st floor in [address]--if not open today you may come back next week to have labs drawn, you do not need an appointment.  If you have labs (blood work) drawn today and your tests are completely normal, you will receive your results only by: MyChart Message (if you have MyChart) OR A paper copy in the mail If you have any lab test that is abnormal or we  need to change your treatment, we will call you to review the results.  Testing/Procedures: Your physician has recommended that you have a cardiopulmonary stress test (CPX). CPX testing is a non-invasive measurement of heart and lung function. It replaces a traditional treadmill stress test. This type of test provides a tremendous amount of information that relates not only to your present condition but also for future outcomes. This test combines measurements of you ventilation, respiratory gas exchange in the lungs, electrocardiogram (EKG), blood pressure and physical response before, during, and following an exercise protocol.   Follow-Up: At [facility], you and your health needs are our priority.  As part of our continuing mission to provide you with exceptional heart care, we have created designated Provider Care Teams.  These Care Teams include your primary Cardiologist (physician) and Advanced Practice Providers (APPs -  Physician Assistants and Nurse Practitioners) who all work together to provide you with the care you need, when you need it.  Your next appointment:   3 month(s)  The format for your next appointment:   In Person  Provider:   Kristeen Miss, MD {  Other Instructions You have been referred to Dr. Judyann Munson for consult regarding possible long COVID. Her office will call you to schedule an appointment to see her.   You are scheduled for a Cardiopulmonary Exercise (CPX) Test as [facility] on: Date: TBD     Time: TBD   Expect to be in the lab for 2 hours. Please plan to arrive 30 minutes prior to your appointment. You may be asked to reschedule your test if you arrive 20 minutes or more after your scheduled appointment time.  Main Campus address: [address] [place], [place] [address]  You may arrive to the Main Entrance A or Entrance C (free valet parking is available at both). -Main Entrance A (on [address]) :proceed to admitting for check  in -Entrance C (on [facility]): proceed to [employer] parking or under hospital deck parking using this code _________  Check In: [facility] waiting room (1st floor)  General Instructions for the day of the test (Please follow all instructions from your physician): Refrain from ingesting a heavy meal, alcohol, or caffeine or using tobacco products within 2 hours of the test (DO NOT FAST for mare than 8 hours). You may have all other non-alcoholic, non -caffeinated beverage,a light snack (crackers,a piece of fruit, carrot sticks, toast bagel,etc) up to your appointment. Avoid significant exertion or exercise within 24 hours of your test. Be prepared to exercise and sweat. Your clothing should permit freedom of movement and include walking or running shoes. Women bring loose fitting short sleeved blouse.  This evaluation may be fatiguing and you may wish ti have someone accompany you to the assessment to drive you home afterward. Bring a list of your medications with you, including dosage and frequency you take the medications (  I.e.,once per day, twice per day, etc). Take all medications as prescribed, unless noted below or instructed to do so by your physician.  Please do not take the following medications prior to your CPX:  _________________________________________________  _________________________________________________  Brief description of the test: A brief lung test will be performed. This will involve you taking deep breaths and blowing hard and fast through your mouth. During these , a clip will be on your nose and you will be breathing through a breathing device.   For the exercise portion of the test you will be walking on a treadmill, or riding a stationary bike, to your maximal effor or until symptoms such as chest pain, shortness of breath, leg pain or dizziness limit your exercise. You will be breathing in and out of a breathing device through your mouth (a clip  will be on your nose again). Your heart rate, ECG, blood pressure, oxygen saturations, breathing rate and depth, amount of oxygen you consume and amount of carbon dioxide you produce will be measured and monitored throughout the exercise test.  If you need to cancel or reschedule your appointment please call [phone] If you have further questions please call your physician or Hilmar Moldovan Aspen, MS, ACSM-RCEP at [phone]    Signed, Kristeen Miss, MD  10/01/2023 5:15 PM    [facility]

## 2023-10-01 ENCOUNTER — Ambulatory Visit
Payer: No Typology Code available for payment source | Attending: Cardiovascular Disease | Admitting: Cardiovascular Disease

## 2023-10-01 ENCOUNTER — Encounter: Payer: Self-pay | Admitting: Cardiovascular Disease

## 2023-10-01 VITALS — BP 128/80 | HR 61 | Ht 62.0 in | Wt 146.8 lb

## 2023-10-01 DIAGNOSIS — U099 Post covid-19 condition, unspecified: Secondary | ICD-10-CM

## 2023-10-01 DIAGNOSIS — I2510 Atherosclerotic heart disease of native coronary artery without angina pectoris: Secondary | ICD-10-CM | POA: Diagnosis not present

## 2023-10-01 NOTE — Patient Instructions (Signed)
Medication Instructions:  Your physician recommends that you continue on your current medications as directed. Please refer to the Current Medication list given to you today.  *If you need a refill on your cardiac medications before your next appointment, please call your pharmacy*  Lab Work: TODAY: Lipid panel, ALT, BMP Please go to [facility] Office on 1st floor in [address]--if not open today you may come back next week to have labs drawn, you do not need an appointment.  If you have labs (blood work) drawn today and your tests are completely normal, you will receive your results only by: MyChart Message (if you have MyChart) OR A paper copy in the mail If you have any lab test that is abnormal or we need to change your treatment, we will call you to review the results.  Testing/Procedures: Your physician has recommended that you have a cardiopulmonary stress test (CPX). CPX testing is a non-invasive measurement of heart and lung function. It replaces a traditional treadmill stress test. This type of test provides a tremendous amount of information that relates not only to your present condition but also for future outcomes. This test combines measurements of you ventilation, respiratory gas exchange in the lungs, electrocardiogram (EKG), blood pressure and physical response before, during, and following an exercise protocol.   Follow-Up: At [facility], you and your health needs are our priority.  As part of our continuing mission to provide you with exceptional heart care, we have created designated Provider Care Teams.  These Care Teams include your primary Cardiologist (physician) and Advanced Practice Providers (APPs -  Physician Assistants and Nurse Practitioners) who all work together to provide you with the care you need, when you need it.  Your next appointment:   3 month(s)  The format for your next appointment:   In Person  Provider:   Kristeen Miss, MD {  Other  Instructions You have been referred to Dr. Judyann Munson for consult regarding possible long COVID. Her office will call you to schedule an appointment to see her.   You are scheduled for a Cardiopulmonary Exercise (CPX) Test as [facility] on: Date: TBD     Time: TBD   Expect to be in the lab for 2 hours. Please plan to arrive 30 minutes prior to your appointment. You may be asked to reschedule your test if you arrive 20 minutes or more after your scheduled appointment time.  Main Campus address: [address] [place], [place] [address] You may arrive to the Main Entrance A or Entrance C (free valet parking is available at both). -Main Entrance A (on [address]) :proceed to admitting for check in -Entrance C (on [facility]): proceed to [employer] parking or under hospital deck parking using this code _________  Check In: [facility] waiting room (1st floor)  General Instructions for the day of the test (Please follow all instructions from your physician): Refrain from ingesting a heavy meal, alcohol, or caffeine or using tobacco products within 2 hours of the test (DO NOT FAST for mare than 8 hours). You may have all other non-alcoholic, non -caffeinated beverage,a light snack (crackers,a piece of fruit, carrot sticks, toast bagel,etc) up to your appointment. Avoid significant exertion or exercise within 24 hours of your test. Be prepared to exercise and sweat. Your clothing should permit freedom of movement and include walking or running shoes. Women bring loose fitting short sleeved blouse.  This evaluation may be fatiguing and you may wish  ti have someone accompany you to the assessment to drive you home afterward. Bring a list of your medications with you, including dosage and frequency you take the medications (  I.e.,once per day, twice per day, etc). Take all medications as prescribed, unless noted below or instructed to do so by your physician.  Please do not  take the following medications prior to your CPX:  _________________________________________________  _________________________________________________  Brief description of the test: A brief lung test will be performed. This will involve you taking deep breaths and blowing hard and fast through your mouth. During these , a clip will be on your nose and you will be breathing through a breathing device.   For the exercise portion of the test you will be walking on a treadmill, or riding a stationary bike, to your maximal effor or until symptoms such as chest pain, shortness of breath, leg pain or dizziness limit your exercise. You will be breathing in and out of a breathing device through your mouth (a clip will be on your nose again). Your heart rate, ECG, blood pressure, oxygen saturations, breathing rate and depth, amount of oxygen you consume and amount of carbon dioxide you produce will be measured and monitored throughout the exercise test.  If you need to cancel or reschedule your appointment please call [phone] If you have further questions please call your physician or Philip Aspen, MS, ACSM-RCEP at [phone]

## 2023-10-03 ENCOUNTER — Other Ambulatory Visit: Payer: Self-pay | Admitting: Obstetrics and Gynecology

## 2023-10-03 DIAGNOSIS — Z0000 Encounter for general adult medical examination without abnormal findings: Secondary | ICD-10-CM

## 2023-10-03 LAB — LAB REPORT - SCANNED: EGFR: 64

## 2023-10-04 ENCOUNTER — Encounter: Payer: Self-pay | Admitting: Cardiovascular Disease

## 2023-10-04 ENCOUNTER — Telehealth: Payer: Self-pay | Admitting: Cardiovascular Disease

## 2023-10-04 NOTE — Telephone Encounter (Signed)
Pt is requesting a callback regarding her stating she was told at her appt on Monday that someone would reach out her to get her Cardiopulmonary exercise test scheduled but she still hasn't heard anything. Please advise

## 2023-10-11 ENCOUNTER — Encounter: Payer: Self-pay | Admitting: Internal Medicine

## 2023-10-11 ENCOUNTER — Ambulatory Visit: Payer: No Typology Code available for payment source | Admitting: Internal Medicine

## 2023-10-11 ENCOUNTER — Other Ambulatory Visit: Payer: Self-pay

## 2023-10-11 ENCOUNTER — Ambulatory Visit
Admission: RE | Admit: 2023-10-11 | Discharge: 2023-10-11 | Disposition: A | Discharge status: Home / Self Care | Payer: No Typology Code available for payment source | Source: Ambulatory Visit | Attending: Obstetrics and Gynecology | Admitting: Obstetrics and Gynecology

## 2023-10-11 VITALS — BP 155/92 | HR 70 | Temp 98.5°F | Wt 150.0 lb

## 2023-10-11 DIAGNOSIS — Z8616 Personal history of COVID-19: Secondary | ICD-10-CM

## 2023-10-11 DIAGNOSIS — Z0000 Encounter for general adult medical examination without abnormal findings: Secondary | ICD-10-CM

## 2023-10-11 MED ORDER — VALACYCLOVIR HCL 1 G PO TABS: 1000.0000 mg | ORAL_TABLET | Freq: Two times a day (BID) | ORAL | 3 refills | Status: DC

## 2023-10-11 NOTE — Progress Notes (Signed)
RFV: new visit for potential long covid  Patient ID: Sydney [name], female   DOB: 1966/10/27, 57 y.o.   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 They report a significant change in health status following their COVID-19 vaccinations in January and February of 2021. Post-vaccination, they experienced illness but continued to participate in races until an episode of syncope during a race.  In November 2021, following their third vaccine dose, the patient experienced a prolonged five-week illness characterized by recurrent sinus and ear infections, severe left ear pain, and general malaise. Despite multiple rounds of antibiotics and prednisone, the symptoms persisted.  On December 30th, 2021, while out for a morning run, the patient experienced sudden onset vomiting and suspected a stroke. They were taken to the hospital where two blood clots were found in their brain and one in their jugular vein, which were believed to have triggered a stroke. Since then, they have been on anticoagulant therapy, initially with Pradaxa and currently with Eliquis.  Post-stroke, the patient reports a significant decrease in exercise tolerance, experiencing severe shortness of breath and episodes of syncope during attempts at physical activity. They also report persistent brain fog, difficulty with word recall, and a general feeling of walking through a haze. These symptoms reportedly worsen with any viral illness, which they have experienced recurrently.  In addition to these symptoms, the patient reports frequent fever blisters, changes in voice quality, and occasional diarrhea. They have been prescribed meclizine for dizziness and have been referred to rheumatology due to abnormal lab results suggestive of an autoimmune process. The patient expresses frustration with their current health status,  noting a significant decline from their previous active lifestyle.  Outpatient Encounter Medications as of 10/11/2023  Medication Sig   Biotin 5 MG TABS Take 5 mg by mouth at bedtime. Chewable   ELIQUIS 5 MG TABS tablet Take 1 tablet (5 mg total) by mouth 2 (two) times daily.   Evolocumab (REPATHA SURECLICK) 140 MG/ML SOAJ Inject 140 mg into the skin every 14 (fourteen) days.   Multiple Vitamin (MULTIVITAMIN) tablet Take 1 tablet by mouth daily.   valACYclovir (VALTREX) 500 MG tablet Take 500 mg by mouth 2 (two) times daily as needed.   No facility-administered encounter medications on file as of 10/11/2023.     Patient Active Problem List   Diagnosis Date Noted   Hyperlipidemia 12/08/2022   Elevated lipoprotein(a) 12/08/2022   Intracranial hypotension, unspecified 09/02/2021   Dizziness 08/31/2021   Nonintractable headache 08/25/2021   Hot flashes 03/24/2021   Elevated blood protein 03/24/2021   Chronic internal jugular vein thrombosis, left (HCC) 12/06/2020   Chronic anticoagulation 11/12/2020   Cerebral venous sinus thrombosis 10/28/2020   Iron deficiency anemia 09/10/2019   CKD (chronic kidney disease) stage 3, GFR 30-59 ml/min (HCC) 09/10/2019     Health Maintenance Due  Topic Date Due   INFLUENZA VACCINE  05/31/2023   COVID-19 Vaccine (4 - 2024-25 season) 07/01/2023     Review of Systems  Physical Exam   BP (!) 155/92   Pulse 70   Temp 98.5 F (36.9 C) (Oral)   Wt 150 lb (68 kg)   SpO2 100%   BMI 27.44 kg/m    Physical Exam  Constitutional:  oriented to person, place, and time. appears well-developed and well-nourished. No distress.  HENT: [place]/AT, PERRLA, no scleral icterus Mouth/Throat: Oropharynx is clear and moist. No oropharyngeal  exudate.  Cardiovascular: Normal rate, regular rhythm and normal heart sounds. Exam reveals no gallop and no friction rub.  No murmur heard.  Pulmonary/Chest: Effort normal and breath sounds normal. No respiratory distress.   has no wheezes.  Neck = supple, no nuchal rigidity Lymphadenopathy: no cervical adenopathy. No axillary adenopathy Neurological: alert and oriented to person, place, and time.  Skin: Skin is warm and dry. No rash noted. No erythema.  Psychiatric: a normal mood and affect.  behavior is normal.    LABS ANA: Speckled pattern  RADIOLOGY Head CT: Two cerebral thrombi (10/28/2020) Jugular vein ultrasound: Thrombus in jugular vein (10/28/2020)  DIAGNOSTIC EKG: Performed Echocardiography: Performed CBC Lab Results  Component Value Date   WBC 3.7 (L) 07/06/2023   RBC 4.07 07/06/2023   HGB 12.5 07/06/2023   HCT 38.4 07/06/2023   PLT 215 07/06/2023   MCV 92.5 07/06/2023   MCH 30.1 07/06/2023   MCHC 32.6 07/06/2023   RDW 11.9 07/06/2023   LYMPHSABS 1.4 07/06/2023   MONOABS 0.2 07/06/2023   EOSABS 0.0 07/06/2023    BMET Lab Results  Component Value Date   NA 137 07/06/2023   K 3.9 07/06/2023   CL 101 07/06/2023   CO2 29 07/06/2023   GLUCOSE 128 (H) 07/06/2023   BUN 15 07/06/2023   CREATININE 1.09 (H) 07/06/2023   CALCIUM 10.0 07/06/2023   GFRNONAA >60 07/06/2023   GFRAA >60 05/07/2019      Assessment and Plan Long COVID Presents with symptoms consistent with long COVID, including fatigue, brain fog, decreased exercise tolerance, and intermittent fevers. Symptoms exacerbate with viral illnesses. Differential includes post-viral syndrome and potential autoimmune components. Discussed multidisciplinary approach at [facility] long COVID clinic, including pulmonary, allergy, immunology, and infectious diseases. Discussed potential clinical trials for long COVID, including immune modulators and SSRIs. - Order stress test - Refer to rheumatology - Consider referral to [facility] long COVID clinic - Review labs for any gaps - Discuss potential clinical trials for long COVID  Stroke with Residual Effects Stroke in December 2021 with residual brain fog, dizziness, and syncope. On  anticoagulation therapy with Eliquis. Symptoms worsen with viral illnesses. Discussed importance of preventing falls due to anticoagulation and need for syncope workup to rule out cardiogenic causes, including tilt table test. - Continue Eliquis - Monitor for syncope and ensure safety measures to prevent falls - Consider further cardiac workup including tilt table test  Recurrent Sinusitis and Ear Infections Recurrent sinus and ear infections treated with multiple rounds of antibiotics and prednisone. Symptoms include severe left ear pain and sinus congestion. Discussed potential need for ENT referral for further evaluation and imaging. - Consider referral to ENT for further evaluation and potential imaging  General Health Maintenance Received COVID-19 and flu vaccines. Discussed importance of wearing a mask to prevent respiratory illnesses, especially given increased susceptibility to viral infections. - Advise wearing a mask to prevent respiratory illnesses - Encourage maintaining a healthy lifestyle and monitoring for any new symptoms  Follow-up - Follow up with rheumatologist in February - Coordinate with Dr. Melburn Popper regarding stress test and will reach out to dr mannan to see if can have pulmonary evaluation as well - Consider follow-up at [facility] long COVID clinic if no improvement.

## 2023-10-12 LAB — CBC WITH DIFFERENTIAL/PLATELET
Absolute Lymphocytes: 1244 {cells}/uL (ref 850–3900)
Absolute Monocytes: 248 {cells}/uL (ref 200–950)
Basophils Absolute: 30 {cells}/uL (ref 0–200)
Basophils Relative: 0.9 %
Eosinophils Absolute: 69 {cells}/uL (ref 15–500)
Eosinophils Relative: 2.1 %
HCT: 42.2 % (ref 35.0–45.0)
Hemoglobin: 13.9 g/dL (ref 11.7–15.5)
MCH: 30.3 pg (ref 27.0–33.0)
MCHC: 32.9 g/dL (ref 32.0–36.0)
MCV: 92.1 fL (ref 80.0–100.0)
MPV: 10.5 fL (ref 7.5–12.5)
Monocytes Relative: 7.5 %
Neutro Abs: 1709 {cells}/uL (ref 1500–7800)
Neutrophils Relative %: 51.8 %
Platelets: 263 10*3/uL (ref 140–400)
RBC: 4.58 10*6/uL (ref 3.80–5.10)
RDW: 12.1 % (ref 11.0–15.0)
Total Lymphocyte: 37.7 %
WBC: 3.3 10*3/uL — ABNORMAL LOW (ref 3.8–10.8)

## 2023-10-12 LAB — SEDIMENTATION RATE: Sed Rate: 29 mm/h (ref 0–30)

## 2023-10-12 LAB — C-REACTIVE PROTEIN: CRP: <3 mg/L (ref <8.0)

## 2023-10-12 LAB — TSH+FREE T4: TSH W/REFLEX TO FT4: 2.13 m[IU]/L

## 2023-10-22 ENCOUNTER — Other Ambulatory Visit (HOSPITAL_COMMUNITY)
Admission: RE | Admit: 2023-10-22 | Discharge: 2023-10-22 | Disposition: A | Discharge status: Home / Self Care | Payer: No Typology Code available for payment source | Source: Ambulatory Visit | Attending: Nurse Practitioner | Admitting: Nurse Practitioner

## 2023-10-22 ENCOUNTER — Other Ambulatory Visit: Payer: Self-pay | Admitting: Nurse Practitioner

## 2023-10-22 DIAGNOSIS — Z124 Encounter for screening for malignant neoplasm of cervix: Secondary | ICD-10-CM | POA: Insufficient documentation

## 2023-10-25 LAB — CYTOLOGY - PAP
Comment: NEGATIVE
Diagnosis: NEGATIVE
High risk HPV: NEGATIVE

## 2023-11-15 ENCOUNTER — Encounter: Payer: Self-pay | Admitting: Cardiovascular Disease

## 2023-11-15 NOTE — Telephone Encounter (Signed)
 error

## 2023-11-20 ENCOUNTER — Encounter: Payer: Self-pay | Admitting: Internal Medicine

## 2023-11-20 ENCOUNTER — Telehealth (INDEPENDENT_AMBULATORY_CARE_PROVIDER_SITE_OTHER): Payer: No Typology Code available for payment source | Admitting: Internal Medicine

## 2023-11-20 ENCOUNTER — Other Ambulatory Visit: Payer: Self-pay

## 2023-11-20 DIAGNOSIS — R55 Syncope and collapse: Secondary | ICD-10-CM | POA: Diagnosis not present

## 2023-11-20 DIAGNOSIS — U099 Post covid-19 condition, unspecified: Secondary | ICD-10-CM | POA: Diagnosis not present

## 2023-11-20 DIAGNOSIS — R002 Palpitations: Secondary | ICD-10-CM

## 2023-11-20 DIAGNOSIS — R0609 Other forms of dyspnea: Secondary | ICD-10-CM

## 2023-11-20 DIAGNOSIS — Z8616 Personal history of COVID-19: Secondary | ICD-10-CM

## 2023-11-21 NOTE — Progress Notes (Signed)
Virtual Visit via Video Note  I connected with Sydney [name] on 11/21/23 at  4:15 PM EST by a video enabled telemedicine application and verified that I am speaking with the correct person using two identifiers.  Location: Patient: in car Provider: at clinic   I discussed the limitations of evaluation and management by telemedicine and the availability of in person appointments. The patient expressed understanding and agreed to proceed.  History of Present Illness: Since the last time that Sydney [name] was in the clinic, she reports that she is still having shortness of breath, some chest discomfort with exercsie. She has not had exercise stress test yet. On new years eve, she had an episode of syncope/spell where she lost consciousness, she is unable to recall all of the events from episode. No head injury. Has not had repeat event. She has upcoming appt with rheum in feb, and sees pulmonary in march. She does recall having one previous episode of loss of consciousness   Observations/Objective:   Assessment and Plan: Shortness of breath/palpitations with exertion =  Will reach out to cardiology to do exercise stress test  Syncope= Also will order tilt table test to evaluate if can determine cause of syncopal spells and discuss with cardiology if need to consider zio patch/cardiac monitoring  Long covid-19 symptoms = has some features c/w long covid but would like further   Follow Up Instructions: See back at end of march.    I discussed the assessment and treatment plan with the patient. The patient was provided an opportunity to ask questions and all were answered. The patient agreed with the plan and demonstrated an understanding of the instructions.   The patient was advised to call back or seek an in-person evaluation if the symptoms worsen or if the condition fails to improve as anticipated.  I have personally spent 42 minutes involved in face-to-face and non-face-to-face  activities for this patient on the day of the visit. Professional time spent includes the following activities: Preparing to see the patient (review of tests), Obtaining and/or reviewing separately obtained history (admission/discharge record), Performing a medically appropriate examination and/or evaluation , Ordering medications/tests/procedures, referring and communicating with other health care professionals, Documenting clinical information in the EMR, Independently interpreting results (not separately reported), Communicating results to the patient/family/caregiver, Counseling and educating the patient/family/caregiver and Care coordination (not separately reported).      Judyann Munson, MD

## 2023-11-22 ENCOUNTER — Telehealth: Payer: Self-pay

## 2023-11-22 ENCOUNTER — Other Ambulatory Visit: Payer: Self-pay | Admitting: Internal Medicine

## 2023-11-22 DIAGNOSIS — R0609 Other forms of dyspnea: Secondary | ICD-10-CM

## 2023-11-22 NOTE — Telephone Encounter (Signed)
Echo stress test scheduled by Lisa at [phone] for 01/03/2024 at 2pm. I have called patient and gave this appt time and date as well as address over voicemail. I also sent via mychart. Dr.Snider informed to sign form sent to her inbox stating she went over risks with patient.

## 2023-12-19 ENCOUNTER — Ambulatory Visit: Payer: No Typology Code available for payment source | Attending: Internal Medicine | Admitting: Internal Medicine

## 2023-12-19 ENCOUNTER — Encounter: Payer: Self-pay | Admitting: Internal Medicine

## 2023-12-19 VITALS — BP 144/74 | HR 63 | Resp 14 | Ht 62.0 in | Wt 139.0 lb

## 2023-12-19 DIAGNOSIS — R42 Dizziness and giddiness: Secondary | ICD-10-CM | POA: Diagnosis not present

## 2023-12-19 DIAGNOSIS — R768 Other specified abnormal immunological findings in serum: Secondary | ICD-10-CM

## 2023-12-19 DIAGNOSIS — G08 Intracranial and intraspinal phlebitis and thrombophlebitis: Secondary | ICD-10-CM | POA: Diagnosis not present

## 2023-12-19 DIAGNOSIS — N1831 Chronic kidney disease, stage 3a: Secondary | ICD-10-CM

## 2023-12-19 NOTE — Progress Notes (Signed)
 Office Visit Note  Patient: Sydney [name]             Date of Birth: 1965-12-06           MRN: [id number]             PCP: Wilfrid Lund, PA Referring: Wilfrid Lund, [place] Visit Date: 12/19/2023 Occupation: @GUAROCC @  Subjective:  New Patient (Initial Visit) (Patient states she has joint pain in her hips and hands. )   Discussed the use of AI scribe software for clinical note transcription with the patient, who gave verbal consent to proceed.  History of Present Illness   Sydney [name] "[name]" is a 58 year old female with a history of venous sinus thrombosis after COVID-19 vaccination in 2021 and subsequent numerous issues including fatigue, rashes, and joint pain in multiple areas here for evaluation of positive ANA test.  She experiences joint pain, swelling, and a facial rash that is red, rashy, and stings. Hair loss also began after her stroke. She reports a history of myocarditis associated with this although TTE studies had been normal but also found to have highly elevated for age coronary calcium score. She is currently under the care of infectious disease specialists some symptoms attributed to long COVID.  Following her COVID-19 vaccinations, she noticed a significant change in her health status, with persistent symptoms that have not resolved. 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 Despite continuing training, she observed a decline in performance.  She was admitted to the hospital for a stroke, resulting in blood clots in her brain and jugular vein. She is under hematology care for hypercoagulability risk. She has no prior history of blood clots.  Ongoing symptoms include episodes of syncope and vomiting during physical exertion, which have slightly improved. She experiences dizziness and palpitations with positional changes, swelling in  her hands, and occasional ankle swelling. No current numbness in her hands, but previous numbness, particularly on the left side. Shoulder pain, attributed to previous athletic activities, has worsened since her health issues began.  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    09/2023 ESR 29 CRP <3  09/2020 LAC neg ACA neg B2GP1 neg  Activities of Daily Living:  Patient reports morning stiffness for 5-10 minutes.   Patient Reports nocturnal pain.  Difficulty dressing/grooming: Denies Difficulty climbing stairs: Denies Difficulty getting out of chair: Denies Difficulty using hands for taps, buttons, cutlery, and/or writing: Reports  Review of Systems  Constitutional:  Positive for fatigue.  HENT:  Positive for mouth sores and mouth dryness.   Eyes:  Positive for dryness.  Respiratory:  Positive for shortness of breath.   Cardiovascular:  Negative for chest pain and palpitations.  Gastrointestinal:  Negative for blood in stool, constipation and diarrhea.  Endocrine: Positive for increased urination.  Genitourinary:  Negative for involuntary urination.  Musculoskeletal:  Positive for joint pain, gait problem, joint pain, joint swelling, myalgias, morning stiffness and myalgias. Negative for muscle weakness and muscle tenderness.  Skin:  Positive for rash and hair loss. Negative for color change and sensitivity to sunlight.  Allergic/Immunologic: Negative for susceptible to infections.  Neurological:  Positive for dizziness. Negative for headaches.  Hematological:  Negative for swollen glands.  Psychiatric/Behavioral:  Positive for sleep disturbance. Negative for depressed mood. The patient is not nervous/anxious.     PMFS History:  Patient Active Problem  List   Diagnosis Date Noted   Positive ANA (antinuclear antibody) 12/19/2023   Hyperlipidemia 12/08/2022   Elevated lipoprotein(a) 12/08/2022   Intracranial hypotension, unspecified  09/02/2021   Dizziness 08/31/2021   Nonintractable headache 08/25/2021   Hot flashes 03/24/2021   Elevated blood protein 03/24/2021   Chronic internal jugular vein thrombosis, left (HCC) 12/06/2020   Chronic anticoagulation 11/12/2020   Cerebral venous sinus thrombosis 10/28/2020   Iron deficiency anemia 09/10/2019   CKD (chronic kidney disease) stage 3, GFR 30-59 ml/min (HCC) 09/10/2019    Past Medical History:  Diagnosis Date   Anemia    Blood clots in brain    COVID    COVID-19    Sept 2022   Headache    History of cold sores    HPV (human papilloma virus) infection    Long COVID    Post-operative nausea and vomiting    Stroke (HCC)     Family History  Problem Relation Age of Onset   Pancreatic cancer Brother    Lung cancer Maternal Grandmother    Lung cancer Maternal Grandfather    Colon cancer Neg Hx    Colon polyps Neg Hx    Esophageal cancer Neg Hx    Rectal cancer Neg Hx    Stomach cancer Neg Hx    Past Surgical History:  Procedure Laterality Date   BLEPHAROPLASTY  2005   CESAREAN SECTION  2002   Social History   Social History Narrative   Lives with son   Right Handed   Drinks no caffeine   Immunization History  Administered Date(s) Administered   Influenza-Unspecified 08/19/2014, 08/04/2020, 08/08/2021   PFIZER(Purple Top)SARS-COV-2 Vaccination 11/10/2019, 12/01/2019, 09/03/2020   Td 08/20/2019   Zoster Recombinant(Shingrix) 08/20/2019, 12/31/2019     Objective: Vital Signs: BP (!) 144/74 (BP Location: Right Arm, Patient Position: Sitting, Cuff Size: Normal)   Pulse 63   Resp 14   Ht 5\' 2"  (1.575 m)   Wt 139 lb (63 kg)   BMI 25.42 kg/m    Physical Exam Eyes:     Conjunctiva/sclera: Conjunctivae normal.  Cardiovascular:     Rate and Rhythm: Normal rate and regular rhythm.  Pulmonary:     Effort: Pulmonary effort is normal.     Breath sounds: Normal breath sounds.  Musculoskeletal:     Right lower leg: No edema.     Left lower leg: No  edema.  Lymphadenopathy:     Cervical: No cervical adenopathy.  Skin:    General: Skin is warm and dry.     Findings: No rash.  Neurological:     General: No focal deficit present.     Mental Status: She is alert.  Psychiatric:        Mood and Affect: Mood normal.      Musculoskeletal Exam:  Neck full ROM no tenderness Shoulders full ROM no tenderness or swelling Elbows full ROM no tenderness or swelling Wrists full ROM no tenderness or swelling Fingers full ROM no tenderness or swelling Knees full ROM no tenderness or swelling Ankles full ROM no tenderness or swelling   Investigation: No additional findings.  Imaging: No results found.  Recent Labs: Lab Results  Component Value Date   WBC 3.3 (L) 10/11/2023   HGB 13.9 10/11/2023   PLT 263 10/11/2023   NA 137 07/06/2023   K 3.9 07/06/2023   CL 101 07/06/2023   CO2 29 07/06/2023   GLUCOSE 128 (H) 07/06/2023   BUN 15 07/06/2023  CREATININE 1.09 (H) 07/06/2023   BILITOT 0.4 07/06/2023   ALKPHOS 81 07/06/2023   AST 22 07/06/2023   ALT 12 07/06/2023   PROT 8.0 07/06/2023   ALBUMIN 4.3 07/06/2023   CALCIUM 10.0 07/06/2023   GFRAA >60 05/07/2019    Speciality Comments: No specialty comments available.  Procedures:  No procedures performed Allergies: Covid-19 (mrna) vaccine, Doxycycline, Atorvastatin, and Rosuvastatin   Assessment / Plan:     Visit Diagnoses: Positive ANA (antinuclear antibody) - Plan: C3 and C4, RNP Antibody, Anti-Smith antibody, Sjogrens syndrome-A extractable nuclear antibody, Anti-DNA antibody, double-stranded History of DVT and sinus thrombosis following COVID-19 vaccination. Current symptoms now persistent and widespread include orthostatic changes, severe tachycardia during exertion, heart palpitations, and skin rashes.Does not present clinical criteria for SLE but multiple persistent complaints. Would expect possible RNP positive COVID related, low threshold to trial DMARD if positive  results particularly HCQ in setting of coagulation history. -Order additional blood tests to investigate further, including specific antibodies related to inflammation and autoimmune conditions. -Consider potential treatment with hydroxychloroquine if tests come back significantly positive.  Alopecia Reports of hair loss in patches since the onset of post-vaccine syndrome.  Joint pain and stiffness Reports of joint pain and stiffness, particularly in hands and wrists. No peripheral joint synovitis present on exam.   Orders: Orders Placed This Encounter  Procedures   C3 and C4   RNP Antibody   Anti-Smith antibody   Sjogrens syndrome-A extractable nuclear antibody   Anti-DNA antibody, double-stranded   No orders of the defined types were placed in this encounter.    Follow-Up Instructions: No follow-ups on file.   Fuller Plan, MD  Note - This record has been created using [employer].  Chart creation errors have been sought, but may not always  have been located. Such creation errors do not reflect on  the standard of medical care.

## 2023-12-20 ENCOUNTER — Encounter: Payer: No Typology Code available for payment source | Admitting: Internal Medicine

## 2023-12-20 LAB — RNP ANTIBODY: Ribonucleic Protein(ENA) Antibody, IgG: 1 AI — AB

## 2023-12-20 LAB — C3 AND C4
C3 Complement: 131 mg/dL (ref 83–193)
C4 Complement: 22 mg/dL (ref 15–57)

## 2023-12-20 LAB — ANTI-DNA ANTIBODY, DOUBLE-STRANDED: ds DNA Ab: <1 [IU]/mL

## 2023-12-20 LAB — SJOGRENS SYNDROME-A EXTRACTABLE NUCLEAR ANTIBODY: SSA (Ro) (ENA) Antibody, IgG: >8 AI — AB

## 2023-12-20 LAB — ANTI-SMITH ANTIBODY: ENA SM Ab Ser-aCnc: <1 AI

## 2023-12-26 ENCOUNTER — Encounter: Payer: Self-pay | Admitting: Internal Medicine

## 2023-12-26 ENCOUNTER — Encounter (HOSPITAL_COMMUNITY): Payer: Self-pay

## 2023-12-27 ENCOUNTER — Telehealth: Payer: Self-pay | Admitting: Internal Medicine

## 2023-12-27 NOTE — Telephone Encounter (Signed)
 Attempted to contact patient and left message to advise patient to call the office and schedule patient 6 month f/u appointment.

## 2023-12-27 NOTE — Telephone Encounter (Signed)
-----   Message from Nurse Richardo Priest sent at 12/27/2023  9:12 AM EST ----- Per Dr. Dimple Casey, Please schedule Ms. Grotz for a follow-up in around 6 months or otherwise later this year.

## 2024-01-03 ENCOUNTER — Ambulatory Visit (HOSPITAL_COMMUNITY): Payer: No Typology Code available for payment source | Attending: Internal Medicine

## 2024-01-03 ENCOUNTER — Ambulatory Visit (HOSPITAL_BASED_OUTPATIENT_CLINIC_OR_DEPARTMENT_OTHER): Payer: No Typology Code available for payment source

## 2024-01-03 DIAGNOSIS — R0609 Other forms of dyspnea: Secondary | ICD-10-CM

## 2024-01-03 MED ORDER — PERFLUTREN LIPID MICROSPHERE
6.0000 mL | INTRAVENOUS | Status: AC | PRN
  Administered 2024-01-03: 6 mL via INTRAVENOUS

## 2024-01-04 ENCOUNTER — Inpatient Hospital Stay: Payer: No Typology Code available for payment source | Attending: Family

## 2024-01-04 ENCOUNTER — Inpatient Hospital Stay: Payer: No Typology Code available for payment source | Admitting: Hematology & Oncology

## 2024-01-04 MED FILL — Perflutren Lipid Microsphere IV Susp 1.1 MG/ML: INTRAVENOUS | Qty: 10 | Status: AC

## 2024-01-21 ENCOUNTER — Encounter: Payer: Self-pay | Admitting: Pulmonary Disease

## 2024-01-23 ENCOUNTER — Ambulatory Visit: Payer: No Typology Code available for payment source | Admitting: Pulmonary Disease

## 2024-02-04 ENCOUNTER — Other Ambulatory Visit: Payer: Self-pay | Admitting: Internal Medicine

## 2024-02-07 ENCOUNTER — Encounter: Payer: Self-pay | Admitting: Cardiovascular Disease

## 2024-02-07 NOTE — Progress Notes (Unsigned)
 Cardiology Office Note:    Date:  02/08/2024   ID:  [id number] Nances [name], DOB 03-19-1966, MRN [id number]  PCP:  Wilfrid Lund, PA   [facility] Providers Cardiologist:  Shalen Petrak    Referring MD: Wilfrid Lund, [place]   Chief Complaint  Patient presents with   coronary artery calcification    History of Present Illness:    Sydney [name] "Sydney [name]" is a 58 y.o. female with a hx of coronary calcifications, hyperlipidemia , DVT and sagital sinus thrombosis following COVID 19 vaccine .  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   Had a severe reaction to her COVID booster , eventually had a stroke   She was recently was found to have coronary calcifications  CAC is 58,  94 th % for age and gender matched controls  Was very active,  doing obstacle course races previously , ran marathons, spartan races,   Now is not able to walk. Has syncope or N/V with any intense workouts   Has been treated for long COVID ,   She has coronary artery calcifications Has tried atorvastatin at multiple doses, has severe myalgias with all statin doses.   Had a bad reaction to her last flu vaccine .   No CP ,  has dyspnea   Does not know her parents   January 19, 2023 [name] is seen for follow up of her dyspnea, CAC Hx of stroke after COVID vaccine   COR CTA : CAC score is 60.7 93rd percentile for age / sex matched controls  RCA :  mild - mod plaque LAD : mild - mod plaque LCx:  normal   Her LDL goal is < 55 - her last LDL was 142 She has been seen in the lipid clinic  She is still having muscle aches with the Rosuvastatin and atorvastatin  Wt is 148 lbs ( down 2 lbs )    Is exercising some .  Walks at lunch.     Dec. 2,2024  [name] is seen for follow up of her CAC, HLD   Gets very short of breath with hiking .  Is not able to run any longer  Was running marathons prior to her stroke .  Remains very limited.  Echo shows normal LV function with  trivial valvular disease  Coronary CTA shows non obstructive CAD     Coronary CTA shows mild coronary irregularities   Will order a cardiopulmonary stress test to be performed in the advanced heart failure clinic.  Have discussed the case with Dr. Ilsa Iha in the infectious disease department.  Will refer on to Dr. Ilsa Iha to see if she may have a long COVID syndrome.   February 08, 2024 Sydney [name] is seen for follow up of her mild CAD, dyspnea  She has seen Dr. Ilsa Iha for possible long COVID  We ordered a cardiopulmonary stress test but she has not scheduled this yet  Has lost 10-15 lbs  Wt is 136  Is able to get out and hike.   She was off her Repatha, Wants to see what her lipids are currently   She has an elevated LP(a)  I think would be interesting to see how well her lipids have improved with diet, exercise, weight loss.  With her elevated lipoprotein a feel pretty strongly that she should get back on her Repatha to help reduce the risk of future cardiovascular events.      Past Medical  History:  Diagnosis Date   Anemia    Blood clots in brain    COVID    COVID-19    Sept 2022   Headache    History of cold sores    HPV (human papilloma virus) infection    Long COVID    Post-operative nausea and vomiting    Stroke [facility])     Past Surgical History:  Procedure Laterality Date   BLEPHAROPLASTY  2005   CESAREAN SECTION  2002    Current Medications: Current Meds  Medication Sig   ELIQUIS 5 MG TABS tablet Take 1 tablet (5 mg total) by mouth 2 (two) times daily.   Multiple Vitamin (MULTIVITAMIN) tablet Take 1 tablet by mouth daily.   Probiotic Product (PROBIOTIC PO) Take by mouth.   valACYclovir (VALTREX) 1000 MG tablet Take 1 tablet (1,000 mg total) by mouth 2 (two) times daily. X 7 days for outbreaks, then daily for prevention   valACYclovir (VALTREX) 500 MG tablet Take 500 mg by mouth 2 (two) times daily as needed.   [DISCONTINUED] Biotin 5 MG TABS Take 5 mg by mouth  at bedtime. Chewable     Allergies:   Covid-19 (mrna) vaccine, Doxycycline, Atorvastatin, and Rosuvastatin   Social History   Socioeconomic History   Marital status: Divorced    Spouse name: Not on file   Number of children: Not on file   Years of education: Not on file   Highest education level: Not on file  Occupational History   Occupation: Charity fundraiser at [employer]  Tobacco Use   Smoking status: Never    Passive exposure: Never   Smokeless tobacco: Never  Vaping Use   Vaping status: Never Used  Substance and Sexual Activity   Alcohol use: Never   Drug use: Never   Sexual activity: Not Currently  Other Topics Concern   Not on file  Social History Narrative   Lives with son   Right Handed   Drinks no caffeine   Social Drivers of Corporate investment banker Strain: Not on file  Food Insecurity: Not on file  Transportation Needs: Not on file  Physical Activity: Not on file  Stress: Not on file  Social Connections: Not on file     Family History: The patient's family history includes Lung cancer in her maternal grandfather and maternal grandmother; Pancreatic cancer in her brother. There is no history of Colon cancer, Colon polyps, Esophageal cancer, Rectal cancer, or Stomach cancer.  ROS:   Please see the history of present illness.     All other systems reviewed and are negative.  EKGs/Labs/Other Studies Reviewed:    The following studies were reviewed today:   EKG:          Recent Labs: 06/15/2023: TSH 2.08 07/06/2023: ALT 12; BUN 15; Creatinine 1.09; Potassium 3.9; Sodium 137 10/11/2023: Hemoglobin 13.9; Platelets 263  Recent Lipid Panel    Component Value Date/Time   CHOL 177 01/19/2023 1401   TRIG 56 01/19/2023 1401   HDL 75 01/19/2023 1401   CHOLHDL 2.4 01/19/2023 1401   CHOLHDL 3.0 08/27/2020 1433   VLDL 11.2 08/20/2019 1536   LDLCALC 91 01/19/2023 1401   LDLCALC 143 (H) 08/27/2020 1433     Risk Assessment/Calculations:       Physical  Exam:     Physical Exam: Blood pressure 114/72, pulse (!) 55, height 5\' 2"  (1.575 m), weight 136 lb 12.8 oz (62.1 kg), SpO2 97%.       GEN:  Well nourished, well developed in no acute distress HEENT: Normal NECK: No JVD; No carotid bruits LYMPHATICS: No lymphadenopathy CARDIAC: RRR   RESPIRATORY:  Clear to auscultation without rales, wheezing or rhonchi  ABDOMEN: Soft, non-tender, non-distended MUSCULOSKELETAL:  No edema; No deformity  SKIN: Warm and dry NEUROLOGIC:  Alert and oriented x 3    ASSESSMENT:    1. Coronary artery calcification   2. Mixed hyperlipidemia   3. Elevated lipoprotein(a)       PLAN:       Shortness of breath with exertion:   [name] had a negative stress echo  She seems to be doing much better She is now hiking without any severe dizziness or shortness of breath.  I have encouraged her to continue to ramp up her exercise.  She will not need to do the cardiopulmonary stress test at this point.      2.  Hyperlipidemia:   She has a elevated lipoprotein a.  She has a relatively high coronary calcium score.  She is currently on Repatha.  She has been off Repatha for a couple of months and wants to see what her lipids are off the Repatha.  I encouraged her to go back and restart the Repatha since I think it will be helping to reduce her risk given her high LP(a).  Will have her follow-up with [place] in 1 year.    Medication Adjustments/Labs and Tests Ordered: Current medicines are reviewed at length with the patient today.  Concerns regarding medicines are outlined above.  Orders Placed This Encounter  Procedures   ALT   Basic metabolic panel with GFR   Lipid panel   No orders of the defined types were placed in this encounter.   Patient Instructions  Lab Work: Lipids, ALT, BMET today If you have labs (blood work) drawn today and your tests are completely normal, you will receive your results only by: MyChart Message (if you have MyChart)  OR A paper copy in the mail If you have any lab test that is abnormal or we need to change your treatment, we will call you to review the results.  Follow-Up: At [facility], you and your health needs are our priority.  As part of our continuing mission to provide you with exceptional heart care, our providers are all part of one team.  This team includes your primary Cardiologist (physician) and Advanced Practice Providers or APPs (Physician Assistants and Nurse Practitioners) who all work together to provide you with the care you need, when you need it.  Your next appointment:   1 year(s)  Provider:   Kristeen Miss, MD     1st Floor: - Lobby - Registration  - Pharmacy  - Lab - Cafe  2nd Floor: - PV Lab - Diagnostic Testing (echo, CT, nuclear med)  3rd Floor: - Vacant  4th Floor: - TCTS (cardiothoracic surgery) - AFib Clinic - Structural Heart Clinic - Vascular Surgery  - Vascular Ultrasound  5th Floor: - HeartCare Cardiology (general and EP) - Clinical Pharmacy for coumadin, hypertension, lipid, weight-loss medications, and med management appointments    Valet parking services will be available as well.     Signed, Kristeen Miss, MD  02/08/2024 4:28 PM    [facility]

## 2024-02-08 ENCOUNTER — Ambulatory Visit: Attending: Cardiovascular Disease | Admitting: Cardiovascular Disease

## 2024-02-08 ENCOUNTER — Encounter: Payer: Self-pay | Admitting: Cardiovascular Disease

## 2024-02-08 VITALS — BP 114/72 | HR 55 | Ht 62.0 in | Wt 136.8 lb

## 2024-02-08 DIAGNOSIS — E782 Mixed hyperlipidemia: Secondary | ICD-10-CM

## 2024-02-08 DIAGNOSIS — E7841 Elevated Lipoprotein(a): Secondary | ICD-10-CM

## 2024-02-08 DIAGNOSIS — I2510 Atherosclerotic heart disease of native coronary artery without angina pectoris: Secondary | ICD-10-CM

## 2024-02-08 NOTE — Patient Instructions (Signed)
 Lab Work: Lipids, ALT, BMET today If you have labs (blood work) drawn today and your tests are completely normal, you will receive your results only by: MyChart Message (if you have MyChart) OR A paper copy in the mail If you have any lab test that is abnormal or we need to change your treatment, we will call you to review the results.  Follow-Up: At [facility], you and your health needs are our priority.  As part of our continuing mission to provide you with exceptional heart care, our providers are all part of one team.  This team includes your primary Cardiologist (physician) and Advanced Practice Providers or APPs (Physician Assistants and Nurse Practitioners) who all work together to provide you with the care you need, when you need it.  Your next appointment:   1 year(s)  Provider:   Kristeen Miss, MD      1st Floor: - Lobby - Registration  - Pharmacy  - Lab - Cafe  2nd Floor: - PV Lab - Diagnostic Testing (echo, CT, nuclear med)  3rd Floor: - Vacant  4th Floor: - TCTS (cardiothoracic surgery) - AFib Clinic - Structural Heart Clinic - Vascular Surgery  - Vascular Ultrasound  5th Floor: - HeartCare Cardiology (general and EP) - Clinical Pharmacy for coumadin, hypertension, lipid, weight-loss medications, and med management appointments    Valet parking services will be available as well.

## 2024-02-29 ENCOUNTER — Inpatient Hospital Stay (HOSPITAL_BASED_OUTPATIENT_CLINIC_OR_DEPARTMENT_OTHER): Admitting: Hematology & Oncology

## 2024-02-29 ENCOUNTER — Other Ambulatory Visit: Payer: Self-pay

## 2024-02-29 ENCOUNTER — Encounter: Payer: Self-pay | Admitting: Hematology & Oncology

## 2024-02-29 ENCOUNTER — Inpatient Hospital Stay: Attending: Family

## 2024-02-29 VITALS — BP 138/83 | HR 56 | Temp 98.5°F | Resp 16 | Ht 62.0 in | Wt 137.0 lb

## 2024-02-29 DIAGNOSIS — Z7901 Long term (current) use of anticoagulants: Secondary | ICD-10-CM | POA: Insufficient documentation

## 2024-02-29 DIAGNOSIS — G08 Intracranial and intraspinal phlebitis and thrombophlebitis: Secondary | ICD-10-CM | POA: Insufficient documentation

## 2024-02-29 DIAGNOSIS — I82C22 Chronic embolism and thrombosis of left internal jugular vein: Secondary | ICD-10-CM | POA: Diagnosis not present

## 2024-02-29 DIAGNOSIS — D509 Iron deficiency anemia, unspecified: Secondary | ICD-10-CM | POA: Insufficient documentation

## 2024-02-29 DIAGNOSIS — D6859 Other primary thrombophilia: Secondary | ICD-10-CM

## 2024-02-29 LAB — CBC WITH DIFFERENTIAL (CANCER CENTER ONLY)
Abs Immature Granulocytes: 0.03 10*3/uL (ref 0.00–0.07)
Basophils Absolute: 0 10*3/uL (ref 0.0–0.1)
Basophils Relative: 1 %
Eosinophils Absolute: 0 10*3/uL (ref 0.0–0.5)
Eosinophils Relative: 1 %
HCT: 39.3 % (ref 36.0–46.0)
Hemoglobin: 13.2 g/dL (ref 12.0–15.0)
Immature Granulocytes: 1 %
Lymphocytes Relative: 36 %
Lymphs Abs: 1.4 10*3/uL (ref 0.7–4.0)
MCH: 31.4 pg (ref 26.0–34.0)
MCHC: 33.6 g/dL (ref 30.0–36.0)
MCV: 93.6 fL (ref 80.0–100.0)
Monocytes Absolute: 0.3 10*3/uL (ref 0.1–1.0)
Monocytes Relative: 9 %
Neutro Abs: 2 10*3/uL (ref 1.7–7.7)
Neutrophils Relative %: 52 %
Platelet Count: 241 10*3/uL (ref 150–400)
RBC: 4.2 MIL/uL (ref 3.87–5.11)
RDW: 13.1 % (ref 11.5–15.5)
WBC Count: 3.8 10*3/uL — ABNORMAL LOW (ref 4.0–10.5)
nRBC: 0 % (ref 0.0–0.2)

## 2024-02-29 LAB — CMP (CANCER CENTER ONLY)
ALT: 12 U/L (ref 0–44)
AST: 22 U/L (ref 15–41)
Albumin: 4.4 g/dL (ref 3.5–5.0)
Alkaline Phosphatase: 76 U/L (ref 38–126)
Anion gap: 7 (ref 5–15)
BUN: 19 mg/dL (ref 6–20)
CO2: 29 mmol/L (ref 22–32)
Calcium: 9.9 mg/dL (ref 8.9–10.3)
Chloride: 102 mmol/L (ref 98–111)
Creatinine: 1.25 mg/dL — ABNORMAL HIGH (ref 0.44–1.00)
GFR, Estimated: 51 mL/min — ABNORMAL LOW (ref >60)
Glucose, Bld: 96 mg/dL (ref 70–99)
Potassium: 4 mmol/L (ref 3.5–5.1)
Sodium: 138 mmol/L (ref 135–145)
Total Bilirubin: 0.3 mg/dL (ref 0.0–1.2)
Total Protein: 8.2 g/dL — ABNORMAL HIGH (ref 6.5–8.1)

## 2024-02-29 LAB — RETICULOCYTES
Immature Retic Fract: 4.9 % (ref 2.3–15.9)
RBC.: 4.1 MIL/uL (ref 3.87–5.11)
Retic Count, Absolute: 33.6 10*3/uL (ref 19.0–186.0)
Retic Ct Pct: 0.8 % (ref 0.4–3.1)

## 2024-02-29 LAB — FERRITIN: Ferritin: 142 ng/mL (ref 11–307)

## 2024-02-29 LAB — D-DIMER, QUANTITATIVE: D-Dimer, Quant: <0.27 ug{FEU}/mL (ref 0.00–0.50)

## 2024-02-29 NOTE — Progress Notes (Signed)
 Hematology and Oncology Follow Up Visit  Sydney [name] [id number] 1966-06-24 58 y.o. 02/29/2024   Principle Diagnosis:  Cerebral venous sinus thrombosis  Chronic left jugular thombus Iron  deficiency anemia     Current Therapy:        Eliquis  5 mg PO BID IV iron  as indicated     Interim History:  Sydney [name] is here today for follow-up.  We last saw her back in September.  Since then, she been doing really well.  She is not exercising.  She has a lot more energy and stamina now.  I am very happy for her.  She is going to start a new job.  She will be with [employer].  I think she starts this in a couple weeks.  She has had no problems with headache.  There is no visual changes.  She has had no cough or shortness of breath.  She has had no change in bowel or bladder habits.  She had a colonoscopy 5 years ago.  Her last mammogram was last year.  She continues on Eliquis .  She is doing fine on the Eliquis .  Her last iron  studies that were done back in September showed a ferritin of 96 with an iron  saturation of 29%.  Currently, I would say performance status is probably ECOG 0.  Medications:  Allergies as of 02/29/2024       Reactions   Covid-19 (mrna) Vaccine Anaphylaxis   BLOOD CLOTS   Doxycycline  Nausea And Vomiting   Atorvastatin     Muscle and joint pain on 10mg  and 80mg  dose   Rosuvastatin     Muscle pain on 5mg  dose        Medication List        Accurate as of Feb 29, 2024  3:16 PM. If you have any questions, ask your nurse or doctor.          Eliquis  5 MG Tabs tablet Generic drug: apixaban  Take 1 tablet (5 mg total) by mouth 2 (two) times daily.   multivitamin tablet Take 1 tablet by mouth daily.   PROBIOTIC PO Take by mouth.   Repatha  SureClick 140 MG/ML Soaj Generic drug: Evolocumab  Inject 140 mg into the skin every 14 (fourteen) days.   valACYclovir  500 MG tablet Commonly known as: VALTREX  Take 500 mg by mouth 2 (two) times daily  as needed.   valACYclovir  1000 MG tablet Commonly known as: Valtrex  Take 1 tablet (1,000 mg total) by mouth 2 (two) times daily. X 7 days for outbreaks, then daily for prevention        Allergies:  Allergies  Allergen Reactions   Covid-19 (Mrna) Vaccine Anaphylaxis    BLOOD CLOTS   Doxycycline  Nausea And Vomiting   Atorvastatin      Muscle and joint pain on 10mg  and 80mg  dose   Rosuvastatin      Muscle pain on 5mg  dose    Past Medical History, Surgical history, Social history, and Family History were reviewed and updated.  Review of Systems: Review of Systems  Constitutional: Negative.   HENT: Negative.    Eyes: Negative.   Respiratory: Negative.    Cardiovascular: Negative.   Gastrointestinal: Negative.   Genitourinary: Negative.   Musculoskeletal: Negative.   Skin: Negative.   Neurological: Negative.   Endo/Heme/Allergies: Negative.   Psychiatric/Behavioral: Negative.       Physical Exam:  height is 5\' 2"  (1.575 m) and weight is 137 lb (62.1 kg). Her oral temperature is 98.5 F (36.9 C). Her  blood pressure is 138/83 and her pulse is 56 (abnormal). Her respiration is 16 and oxygen saturation is 100%.   Wt Readings from Last 3 Encounters:  02/29/24 137 lb (62.1 kg)  02/08/24 136 lb 12.8 oz (62.1 kg)  12/19/23 139 lb (63 kg)    Physical Exam Vitals reviewed.  HENT:     Head: Normocephalic and atraumatic.  Eyes:     Pupils: Pupils are equal, round, and reactive to light.  Cardiovascular:     Rate and Rhythm: Normal rate and regular rhythm.     Heart sounds: Normal heart sounds.  Pulmonary:     Effort: Pulmonary effort is normal.     Breath sounds: Normal breath sounds.  Abdominal:     General: Bowel sounds are normal.     Palpations: Abdomen is soft.  Musculoskeletal:        General: No tenderness or deformity. Normal range of motion.     Cervical back: Normal range of motion.  Lymphadenopathy:     Cervical: No cervical adenopathy.  Skin:     General: Skin is warm and dry.     Findings: No erythema or rash.  Neurological:     Mental Status: She is alert and oriented to person, place, and time.  Psychiatric:        Behavior: Behavior normal.        Thought Content: Thought content normal.        Judgment: Judgment normal.      Lab Results  Component Value Date   WBC 3.8 (L) 02/29/2024   HGB 13.2 02/29/2024   HCT 39.3 02/29/2024   MCV 93.6 02/29/2024   PLT 241 02/29/2024   Lab Results  Component Value Date   FERRITIN 96 07/06/2023   IRON  80 07/06/2023   TIBC 280 07/06/2023   UIBC 200 07/06/2023   IRONPCTSAT 29 07/06/2023   Lab Results  Component Value Date   RETICCTPCT 0.8 02/29/2024   RBC 4.20 02/29/2024   RBC 4.10 02/29/2024   No results found for: "KPAFRELGTCHN", "LAMBDASER", "KAPLAMBRATIO" No results found for: "IGGSERUM", "IGA", "IGMSERUM" Lab Results  Component Value Date   ALBUMINELP 3.8 09/28/2020   A1GS 0.5 (H) 09/28/2020   A2GS 0.9 09/28/2020   BETS 0.6 09/28/2020   BETA2SER 0.4 09/28/2020   GAMS 1.7 09/28/2020   SPEI  09/28/2020     Comment:     . Alpha-1 globulin increase noted. .      Chemistry      Component Value Date/Time   NA 137 07/06/2023 1319   NA 140 10/13/2022 1421   K 3.9 07/06/2023 1319   CL 101 07/06/2023 1319   CO2 29 07/06/2023 1319   BUN 15 07/06/2023 1319   BUN 12 10/13/2022 1421   CREATININE 1.09 (H) 07/06/2023 1319   CREATININE 1.29 (H) 11/12/2020 1557      Component Value Date/Time   CALCIUM  10.0 07/06/2023 1319   CALCIUM  9.9 06/15/2023 0000   ALKPHOS 81 07/06/2023 1319   AST 22 07/06/2023 1319   ALT 12 07/06/2023 1319   BILITOT 0.4 07/06/2023 1319       Impression and Plan: Sydney [name] is a very pleasant 58 yo caucasian female with diagnosis of nonocclusive thrombus within the mid superior sagittal sinus extending posteriorly, left sigmoid sinus and visualized left internal jugular vein.  This all occurred after she had a COVID vaccine.   She is  tolerating Eliquis  and taking as prescribed.  We will continue her on  the Eliquis .  We will see what her iron  studies look like.  I would have to think that they are going to be okay.  I am do so happy that she feels good.  She really is enjoying life.  She is active.  She travels.  I know she will enjoy working at [employer].  We will plan to get her back in the Fall.    Ivor Mars, MD 5/2/20253:16 PM

## 2024-03-03 ENCOUNTER — Encounter: Payer: Self-pay | Admitting: *Deleted

## 2024-03-03 LAB — IRON AND IRON BINDING CAPACITY (CC-WL,HP ONLY)
Iron: 62 ug/dL (ref 28–170)
Saturation Ratios: 20 % (ref 10.4–31.8)
TIBC: 314 ug/dL (ref 250–450)
UIBC: 252 ug/dL (ref 148–442)

## 2024-03-14 ENCOUNTER — Ambulatory Visit: Admitting: Pulmonary Disease

## 2024-05-01 ENCOUNTER — Telehealth: Payer: Self-pay | Admitting: Pharmacy Technician

## 2024-05-01 ENCOUNTER — Telehealth (HOSPITAL_COMMUNITY): Payer: Self-pay | Admitting: Cardiovascular Disease

## 2024-05-01 NOTE — Telephone Encounter (Signed)
 Pharmacy Patient Advocate Encounter   Received notification from CoverMyMeds that prior authorization for repatha  is required/requested.   Insurance verification completed.   The patient is insured through [employer] .   Per test claim: PA required; PA started via CoverMyMeds. KEY [id number] . Please see clinical question(s) below that I am not finding the answer to in their chart and advise.    Insurance is asking for labs in the last 120 days. Last labs I see are from 09/2023. Can she get new labs for the prior authorization?

## 2024-05-01 NOTE — Telephone Encounter (Signed)
 Pt will stop by the office next week for fasting blood work

## 2024-05-14 NOTE — Telephone Encounter (Signed)
Still has not had labs done

## 2024-05-27 ENCOUNTER — Other Ambulatory Visit: Payer: Self-pay | Admitting: Hematology & Oncology

## 2024-05-27 ENCOUNTER — Telehealth: Payer: Self-pay | Admitting: Pharmacy Technician

## 2024-05-27 ENCOUNTER — Encounter: Payer: Self-pay | Admitting: Internal Medicine

## 2024-05-27 ENCOUNTER — Other Ambulatory Visit: Payer: Self-pay | Admitting: Internal Medicine

## 2024-05-27 DIAGNOSIS — E782 Mixed hyperlipidemia: Secondary | ICD-10-CM

## 2024-05-27 MED ORDER — VALACYCLOVIR HCL 1 G PO TABS: 1000.0000 mg | ORAL_TABLET | Freq: Every day | ORAL | 0 refills | Status: AC

## 2024-05-27 NOTE — Telephone Encounter (Signed)
 Spoke to patient she will have fasting lipid and hepatic panels this week.Orders placed.

## 2024-05-27 NOTE — Addendum Note (Signed)
 Addended by: CHRISTIANNE CHANNING PARAS on: 05/27/2024 11:56 AM   Modules accepted: Orders

## 2024-05-27 NOTE — Telephone Encounter (Signed)
 Okay to send in 30 day supply of valacyclovir  1 gram daily per Dr. Luiz.   Saydi Kobel, BSN, RN

## 2024-05-27 NOTE — Telephone Encounter (Signed)
 Hi, her insurance needs updated labs. Can she have labs done? Then we can send this prior authorization. Thank you!

## 2024-05-28 ENCOUNTER — Encounter: Payer: Self-pay | Admitting: *Deleted

## 2024-05-28 ENCOUNTER — Telehealth: Payer: Self-pay | Admitting: *Deleted

## 2024-05-28 NOTE — Telephone Encounter (Signed)
 Tried to process CoverMyMeds Key: [id number] per email received from satellite  a claim was rejected at [employer]  PA request contained a historical start date of 10/23//2024 per [facility]. Renewed request to change date of service pharmacy entered to Eliquis  order date of 05/27/2024 creating Key: [id number].  Same response received.  Initiated a new request creating Key: [id number], PA Case ID: [id number].  Response. This client is handled by [facility] in [facility]. Please warm transfer caller to the designated team. Connected with [facility] pharmacy help desk representative Estela [phone]) for telephone authorization.  Transferred to a different prior authorization area that handles this plan [phone]). 1339: Connected with Myah, proceeded with prior authorization.  This does not require prior authorization.  It was run 05/27/2024 at [facility]armacy in [place], [place]. Samule Morrison Chassell returned message as requested by this nurse.  Advised of above and prescription may be ready for pick up.  Now I work for [employer], have changed pharmacies, so I guess this had to start over.  Yes I do have a card from the manufacturer for a $10.00 copay. Denis questions or needs.

## 2024-06-11 ENCOUNTER — Ambulatory Visit: Payer: Self-pay | Admitting: *Deleted

## 2024-06-11 LAB — LIPID PANEL
Chol/HDL Ratio: 2 ratio (ref 0.0–4.4)
Cholesterol, Total: 147 mg/dL (ref 100–199)
HDL: 73 mg/dL (ref >39)
LDL Chol Calc (NIH): 60 mg/dL (ref 0–99)
Triglycerides: 70 mg/dL (ref 0–149)
VLDL Cholesterol Cal: 14 mg/dL (ref 5–40)

## 2024-06-11 LAB — HEPATIC FUNCTION PANEL
ALT: 23 IU/L (ref 0–32)
AST: 38 IU/L (ref 0–40)
Albumin: 4.4 g/dL (ref 3.8–4.9)
Alkaline Phosphatase: 92 IU/L (ref 44–121)
Bilirubin Total: 0.4 mg/dL (ref 0.0–1.2)
Bilirubin, Direct: 0.13 mg/dL (ref 0.00–0.40)
Total Protein: 7.9 g/dL (ref 6.0–8.5)

## 2024-06-17 ENCOUNTER — Telehealth: Payer: Self-pay | Admitting: Pharmacy Technician

## 2024-06-17 ENCOUNTER — Encounter: Payer: Self-pay | Admitting: Family

## 2024-06-17 ENCOUNTER — Other Ambulatory Visit (HOSPITAL_COMMUNITY): Payer: Self-pay

## 2024-06-17 NOTE — Telephone Encounter (Signed)
 Pharmacy Patient Advocate Encounter   Received notification from CoverMyMeds that prior authorization for Repatha  Sureclicks is required/requested.   Insurance verification completed.   The patient is insured through [employer] .   Per test claim: PA required; PA submitted to above mentioned insurance via Latent Key/confirmation #/EOC [id number] Status is pending Used new labs.

## 2024-06-17 NOTE — Telephone Encounter (Signed)
 Pharmacy Patient Advocate Encounter  Received notification from [employer] [facility] that Prior Authorization for Repatha  SureClick has been APPROVED from 06/07/24 to 10/29/38. Ran test claim, Copay is $45. This test claim was processed through [facility]- copay amounts may vary at other pharmacies due to pharmacy/plan contracts, or as the patient moves through the different stages of their insurance plan.   PA #/Case ID/Reference #: [id number]

## 2024-08-25 ENCOUNTER — Encounter: Payer: Self-pay | Admitting: Family

## 2024-08-27 ENCOUNTER — Other Ambulatory Visit: Payer: Self-pay | Admitting: Internal Medicine

## 2024-08-27 DIAGNOSIS — Z1231 Encounter for screening mammogram for malignant neoplasm of breast: Secondary | ICD-10-CM

## 2024-09-01 ENCOUNTER — Inpatient Hospital Stay: Payer: PRIVATE HEALTH INSURANCE | Attending: Hematology & Oncology

## 2024-09-01 ENCOUNTER — Ambulatory Visit: Payer: Self-pay | Admitting: Hematology & Oncology

## 2024-09-01 ENCOUNTER — Other Ambulatory Visit: Payer: Self-pay

## 2024-09-01 ENCOUNTER — Encounter: Payer: Self-pay | Admitting: *Deleted

## 2024-09-01 ENCOUNTER — Inpatient Hospital Stay: Payer: PRIVATE HEALTH INSURANCE | Admitting: Hematology & Oncology

## 2024-09-01 VITALS — BP 123/69 | HR 53 | Temp 98.2°F | Resp 16 | Ht 62.0 in

## 2024-09-01 DIAGNOSIS — G08 Intracranial and intraspinal phlebitis and thrombophlebitis: Secondary | ICD-10-CM | POA: Diagnosis not present

## 2024-09-01 DIAGNOSIS — I82C22 Chronic embolism and thrombosis of left internal jugular vein: Secondary | ICD-10-CM | POA: Diagnosis not present

## 2024-09-01 DIAGNOSIS — D509 Iron deficiency anemia, unspecified: Secondary | ICD-10-CM | POA: Diagnosis not present

## 2024-09-01 DIAGNOSIS — Z7901 Long term (current) use of anticoagulants: Secondary | ICD-10-CM | POA: Diagnosis not present

## 2024-09-01 DIAGNOSIS — N1831 Chronic kidney disease, stage 3a: Secondary | ICD-10-CM | POA: Diagnosis not present

## 2024-09-01 LAB — CBC WITH DIFFERENTIAL (CANCER CENTER ONLY)
Abs Immature Granulocytes: 0.02 K/uL (ref 0.00–0.07)
Basophils Absolute: 0 K/uL (ref 0.0–0.1)
Basophils Relative: 1 %
Eosinophils Absolute: 0.1 K/uL (ref 0.0–0.5)
Eosinophils Relative: 3 %
HCT: 38.9 % (ref 36.0–46.0)
Hemoglobin: 13 g/dL (ref 12.0–15.0)
Immature Granulocytes: 1 %
Lymphocytes Relative: 31 %
Lymphs Abs: 1.1 K/uL (ref 0.7–4.0)
MCH: 31.4 pg (ref 26.0–34.0)
MCHC: 33.4 g/dL (ref 30.0–36.0)
MCV: 94 fL (ref 80.0–100.0)
Monocytes Absolute: 0.3 K/uL (ref 0.1–1.0)
Monocytes Relative: 8 %
Neutro Abs: 1.9 K/uL (ref 1.7–7.7)
Neutrophils Relative %: 56 %
Platelet Count: 246 K/uL (ref 150–400)
RBC: 4.14 MIL/uL (ref 3.87–5.11)
RDW: 12.4 % (ref 11.5–15.5)
WBC Count: 3.5 K/uL — ABNORMAL LOW (ref 4.0–10.5)
nRBC: 0 % (ref 0.0–0.2)

## 2024-09-01 LAB — CMP (CANCER CENTER ONLY)
ALT: 12 U/L (ref 0–44)
AST: 25 U/L (ref 15–41)
Albumin: 4.4 g/dL (ref 3.5–5.0)
Alkaline Phosphatase: 85 U/L (ref 38–126)
Anion gap: 10 (ref 5–15)
BUN: 21 mg/dL — ABNORMAL HIGH (ref 6–20)
CO2: 24 mmol/L (ref 22–32)
Calcium: 9.5 mg/dL (ref 8.9–10.3)
Chloride: 105 mmol/L (ref 98–111)
Creatinine: 1.03 mg/dL — ABNORMAL HIGH (ref 0.44–1.00)
GFR, Estimated: >60 mL/min (ref >60)
Glucose, Bld: 101 mg/dL — ABNORMAL HIGH (ref 70–99)
Potassium: 4.1 mmol/L (ref 3.5–5.1)
Sodium: 139 mmol/L (ref 135–145)
Total Bilirubin: 0.4 mg/dL (ref 0.0–1.2)
Total Protein: 8.2 g/dL — ABNORMAL HIGH (ref 6.5–8.1)

## 2024-09-01 LAB — IRON AND IRON BINDING CAPACITY (CC-WL,HP ONLY)
Iron: 57 ug/dL (ref 28–170)
Saturation Ratios: 18 % (ref 10.4–31.8)
TIBC: 312 ug/dL (ref 250–450)
UIBC: 255 ug/dL

## 2024-09-01 LAB — RETICULOCYTES
Immature Retic Fract: 5.7 % (ref 2.3–15.9)
RBC.: 4.19 MIL/uL (ref 3.87–5.11)
Retic Count, Absolute: 46.5 K/uL (ref 19.0–186.0)
Retic Ct Pct: 1.1 % (ref 0.4–3.1)

## 2024-09-01 LAB — FERRITIN: Ferritin: 141 ng/mL (ref 11–307)

## 2024-09-01 NOTE — Progress Notes (Signed)
 Hematology and Oncology Follow Up Visit  Suzanne Kho [id number] [birth year] 58 y.o. 09/01/2024   Principle Diagnosis:  Cerebral venous sinus thrombosis  Chronic left jugular thombus Iron  deficiency anemia     Current Therapy:        Eliquis  5 mg PO BID IV iron  as indicated     Interim History:  Ms. Lupien is here today for follow-up.  She is doing quite well.  She really enjoys her job at [employer].  She is quite busy over there.  Her boyfriend lives up in [place] .  She actually just got back  this morning from [place] .  Hopefully, they will be able to be together for a long time in the future.   It sounds like he will be coming down for the holidays.  She has had no problems with exercise.  She does workout.  She does stay active.  She has had no issues with the anticoagulation.  She is on Eliquis .  There has been no bleeding.  Has been no problems with pain.  She has had no cough or shortness of breath.  She has had no change in bowel or bladder habits.  She has had no headache.  There has been no visual changes.  Overall, I would have to say that her performance status is probably ECOG 0.    Medications:  Allergies as of 09/01/2024       Reactions   Covid-19 (mrna) Vaccine Anaphylaxis   BLOOD CLOTS   Doxycycline  Nausea And Vomiting   Atorvastatin     Muscle and joint pain on 10mg  and 80mg  dose   Rosuvastatin     Muscle pain on 5mg  dose        Medication List        Accurate as of September 01, 2024  8:37 AM. If you have any questions, ask your nurse or doctor.          Eliquis  5 MG Tabs tablet Generic drug: apixaban  Take 1 tablet (5 mg total) by mouth 2 times daily.   multivitamin tablet Take 1 tablet by mouth daily.   PROBIOTIC PO Take by mouth.   Repatha  SureClick 140 MG/ML Soaj Generic drug: Evolocumab  Inject 140 mg into the skin every 14 (fourteen) days.   valACYclovir  1000 MG tablet Commonly known as: Valtrex  Take 1 tablet  (1,000 mg total) by mouth daily.        Allergies:  Allergies  Allergen Reactions   Covid-19 (Mrna) Vaccine Anaphylaxis    BLOOD CLOTS   Doxycycline  Nausea And Vomiting   Atorvastatin      Muscle and joint pain on 10mg  and 80mg  dose   Rosuvastatin      Muscle pain on 5mg  dose    Past Medical History, Surgical history, Social history, and Family History were reviewed and updated.  Review of Systems: Review of Systems  Constitutional: Negative.   HENT: Negative.    Eyes: Negative.   Respiratory: Negative.    Cardiovascular: Negative.   Gastrointestinal: Negative.   Genitourinary: Negative.   Musculoskeletal: Negative.   Skin: Negative.   Neurological: Negative.   Endo/Heme/Allergies: Negative.   Psychiatric/Behavioral: Negative.       Physical Exam:  height is 5' 2 (1.575 m). Her oral temperature is 98.2 F (36.8 C). Her blood pressure is 123/69 and her pulse is 53 (abnormal). Her respiration is 16 and oxygen saturation is 100%.   Wt Readings from Last 3 Encounters:  02/29/24 137 lb (62.1 kg)  02/08/24 136 lb  12.8 oz (62.1 kg)  12/19/23 139 lb (63 kg)    Physical Exam Vitals reviewed.  HENT:     Head: Normocephalic and atraumatic.  Eyes:     Pupils: Pupils are equal, round, and reactive to light.  Cardiovascular:     Rate and Rhythm: Normal rate and regular rhythm.     Heart sounds: Normal heart sounds.  Pulmonary:     Effort: Pulmonary effort is normal.     Breath sounds: Normal breath sounds.  Abdominal:     General: Bowel sounds are normal.     Palpations: Abdomen is soft.  Musculoskeletal:        General: No tenderness or deformity. Normal range of motion.     Cervical back: Normal range of motion.  Lymphadenopathy:     Cervical: No cervical adenopathy.  Skin:    General: Skin is warm and dry.     Findings: No erythema or rash.  Neurological:     Mental Status: She is alert and oriented to person, place, and time.  Psychiatric:         Behavior: Behavior normal.        Thought Content: Thought content normal.        Judgment: Judgment normal.      Lab Results  Component Value Date   WBC 3.5 (L) 09/01/2024   HGB 13.0 09/01/2024   HCT 38.9 09/01/2024   MCV 94.0 09/01/2024   PLT 246 09/01/2024   Lab Results  Component Value Date   FERRITIN 142 02/29/2024   IRON  62 02/29/2024   TIBC 314 02/29/2024   UIBC 252 02/29/2024   IRONPCTSAT 20 02/29/2024   Lab Results  Component Value Date   RETICCTPCT 1.1 09/01/2024   RBC 4.14 09/01/2024   RBC 4.19 09/01/2024   No results found for: KPAFRELGTCHN, LAMBDASER, KAPLAMBRATIO No results found for: KIMBERLY LE, IGMSERUM Lab Results  Component Value Date   ALBUMINELP 3.8 09/28/2020   A1GS 0.5 (H) 09/28/2020   A2GS 0.9 09/28/2020   BETS 0.6 09/28/2020   BETA2SER 0.4 09/28/2020   GAMS 1.7 09/28/2020   SPEI  09/28/2020     Comment:     . Alpha-1 globulin increase noted. .      Chemistry      Component Value Date/Time   NA 139 09/01/2024 0756   NA 140 10/13/2022 1421   K 4.1 09/01/2024 0756   CL 105 09/01/2024 0756   CO2 24 09/01/2024 0756   BUN 21 (H) 09/01/2024 0756   BUN 12 10/13/2022 1421   CREATININE 1.03 (H) 09/01/2024 0756   CREATININE 1.29 (H) 11/12/2020 1557      Component Value Date/Time   CALCIUM  9.5 09/01/2024 0756   CALCIUM  9.9 06/15/2023 0000   ALKPHOS 85 09/01/2024 0756   AST 25 09/01/2024 0756   ALT 12 09/01/2024 0756   BILITOT 0.4 09/01/2024 0756       Impression and Plan: Ms. Mahaffy is a very pleasant 57 yo caucasian female with diagnosis of nonocclusive thrombus within the mid superior sagittal sinus extending posteriorly, left sigmoid sinus and visualized left internal jugular vein.  This all occurred after she had a COVID vaccine.   I give her permission not to do the flu vaccine.  When she got flu vaccine last, she got very sick with it.  She was hospitalized with a.  As such, I just do not believe that she is a  good candidate for flu vaccines.  Her birthday is  coming up in a couple weeks.  I know that she will have a wonderful birthday.  I know is in the middle of the week.  Hopefully, her boyfriend will be able to come down to celebrate.  We will still plan to get her back in 6 months.  I think this is reasonable.   Maude JONELLE Crease, MD 11/3/20258:37 AM

## 2024-09-02 ENCOUNTER — Encounter: Payer: Self-pay | Admitting: Family

## 2024-09-02 ENCOUNTER — Other Ambulatory Visit: Payer: Self-pay | Admitting: Family

## 2024-09-02 NOTE — Telephone Encounter (Signed)
 Advised via MyChart.

## 2024-09-02 NOTE — Telephone Encounter (Signed)
-----   Message from Maude JONELLE Crease sent at 09/01/2024  5:55 PM EST ----- Please call and let him know that the iron  saturation is going down.  She probably would benefit from a dose of IV iron .  Please set this up.  Jeralyn ----- Message ----- From: Rebecka, [facility] Sent: 09/01/2024   8:12 AM EST To: Maude JONELLE Crease, MD

## 2024-09-04 ENCOUNTER — Telehealth: Payer: Self-pay | Admitting: Hematology & Oncology

## 2024-09-04 NOTE — Telephone Encounter (Signed)
 Lvm for patient to return call for scheduling 1 dose of iv iron .

## 2024-09-19 ENCOUNTER — Inpatient Hospital Stay: Payer: PRIVATE HEALTH INSURANCE

## 2024-09-19 VITALS — BP 112/67 | HR 60 | Temp 98.1°F | Resp 18

## 2024-09-19 DIAGNOSIS — D509 Iron deficiency anemia, unspecified: Secondary | ICD-10-CM | POA: Diagnosis not present

## 2024-09-19 MED ORDER — SODIUM CHLORIDE 0.9 % IV SOLN: INTRAVENOUS | Status: DC

## 2024-09-19 MED ORDER — IRON SUCROSE 300 MG IVPB - SIMPLE MED
300.0000 mg | Freq: Once | Status: AC
  Administered 2024-09-19: 300 mg via INTRAVENOUS
  Filled 2024-09-19: qty 300

## 2024-09-19 NOTE — Patient Instructions (Signed)

## 2024-10-08 ENCOUNTER — Other Ambulatory Visit: Payer: Self-pay | Admitting: Medical Genetics

## 2024-10-15 ENCOUNTER — Inpatient Hospital Stay
Admission: RE | Admit: 2024-10-15 | Discharge: 2024-10-15 | Payer: PRIVATE HEALTH INSURANCE | Attending: Internal Medicine | Admitting: Internal Medicine

## 2024-10-15 ENCOUNTER — Other Ambulatory Visit: Payer: Self-pay | Admitting: Pharmacist Clinician (PhC)/ Clinical Pharmacy Specialist

## 2024-10-15 DIAGNOSIS — Z1231 Encounter for screening mammogram for malignant neoplasm of breast: Secondary | ICD-10-CM

## 2024-10-15 MED ORDER — REPATHA SURECLICK 140 MG/ML ~~LOC~~ SOAJ: 1.0000 | SUBCUTANEOUS | 3 refills | Status: AC

## 2024-12-09 ENCOUNTER — Other Ambulatory Visit: Payer: Self-pay

## 2025-03-02 ENCOUNTER — Inpatient Hospital Stay: Payer: PRIVATE HEALTH INSURANCE

## 2025-03-02 ENCOUNTER — Inpatient Hospital Stay: Payer: PRIVATE HEALTH INSURANCE | Admitting: Hematology & Oncology
